# Patient Record
Sex: Female | Born: 1955 | Race: Black or African American | Hispanic: No | Marital: Single | Smoking: Never smoker
Health system: Southern US, Community
[De-identification: ages and names within clinical notes are randomized; demographics above are authoritative.]

## PROBLEM LIST (undated history)

## (undated) DIAGNOSIS — E785 Hyperlipidemia, unspecified: Secondary | ICD-10-CM

## (undated) DIAGNOSIS — E669 Obesity, unspecified: Secondary | ICD-10-CM

## (undated) DIAGNOSIS — F329 Major depressive disorder, single episode, unspecified: Secondary | ICD-10-CM

## (undated) DIAGNOSIS — M6208 Separation of muscle (nontraumatic), other site: Secondary | ICD-10-CM

## (undated) DIAGNOSIS — F32A Depression, unspecified: Secondary | ICD-10-CM

## (undated) DIAGNOSIS — M719 Bursopathy, unspecified: Secondary | ICD-10-CM

## (undated) DIAGNOSIS — M1990 Unspecified osteoarthritis, unspecified site: Secondary | ICD-10-CM

## (undated) DIAGNOSIS — M549 Dorsalgia, unspecified: Secondary | ICD-10-CM

## (undated) DIAGNOSIS — Z9889 Other specified postprocedural states: Secondary | ICD-10-CM

## (undated) DIAGNOSIS — I10 Essential (primary) hypertension: Secondary | ICD-10-CM

## (undated) DIAGNOSIS — K429 Umbilical hernia without obstruction or gangrene: Secondary | ICD-10-CM

## (undated) DIAGNOSIS — F419 Anxiety disorder, unspecified: Secondary | ICD-10-CM

## (undated) DIAGNOSIS — G4730 Sleep apnea, unspecified: Secondary | ICD-10-CM

## (undated) DIAGNOSIS — J302 Other seasonal allergic rhinitis: Secondary | ICD-10-CM

## (undated) DIAGNOSIS — K219 Gastro-esophageal reflux disease without esophagitis: Secondary | ICD-10-CM

## (undated) DIAGNOSIS — K2970 Gastritis, unspecified, without bleeding: Secondary | ICD-10-CM

## (undated) DIAGNOSIS — R079 Chest pain, unspecified: Secondary | ICD-10-CM

## (undated) DIAGNOSIS — G8929 Other chronic pain: Secondary | ICD-10-CM

## (undated) DIAGNOSIS — B9681 Helicobacter pylori [H. pylori] as the cause of diseases classified elsewhere: Secondary | ICD-10-CM

## (undated) DIAGNOSIS — E119 Type 2 diabetes mellitus without complications: Secondary | ICD-10-CM

## (undated) DIAGNOSIS — K5909 Other constipation: Secondary | ICD-10-CM

## (undated) DIAGNOSIS — D649 Anemia, unspecified: Secondary | ICD-10-CM

## (undated) HISTORY — DX: Umbilical hernia without obstruction or gangrene: K42.9

## (undated) HISTORY — PX: DILATION AND CURETTAGE OF UTERUS: SHX78

## (undated) HISTORY — DX: Essential (primary) hypertension: I10

## (undated) HISTORY — DX: Other constipation: K59.09

## (undated) HISTORY — DX: Separation of muscle (nontraumatic), other site: M62.08

## (undated) HISTORY — DX: Helicobacter pylori (H. pylori) as the cause of diseases classified elsewhere: K29.70

## (undated) HISTORY — DX: Obesity, unspecified: E66.9

## (undated) HISTORY — PX: TUBAL LIGATION: SHX77

## (undated) HISTORY — DX: Other specified postprocedural states: Z98.890

## (undated) HISTORY — DX: Hyperlipidemia, unspecified: E78.5

## (undated) HISTORY — DX: Chest pain, unspecified: R07.9

## (undated) HISTORY — DX: Helicobacter pylori (H. pylori) as the cause of diseases classified elsewhere: B96.81

## (undated) HISTORY — DX: Bursopathy, unspecified: M71.9

## (undated) HISTORY — DX: Other seasonal allergic rhinitis: J30.2

---
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
## 1997-09-02 HISTORY — PX: CHOLECYSTECTOMY: SHX55

## 2009-02-03 ENCOUNTER — Ambulatory Visit: Payer: Self-pay | Admitting: Cardiology

## 2009-02-04 ENCOUNTER — Encounter: Payer: Self-pay | Admitting: Cardiology

## 2009-02-23 ENCOUNTER — Ambulatory Visit: Payer: Self-pay | Admitting: Cardiology

## 2009-03-21 DIAGNOSIS — I10 Essential (primary) hypertension: Secondary | ICD-10-CM | POA: Insufficient documentation

## 2009-03-21 DIAGNOSIS — R079 Chest pain, unspecified: Secondary | ICD-10-CM

## 2009-12-21 ENCOUNTER — Ambulatory Visit (HOSPITAL_COMMUNITY): Admission: RE | Admit: 2009-12-21 | Discharge: 2009-12-21 | Payer: Self-pay | Admitting: Family Medicine

## 2009-12-31 DIAGNOSIS — Z9889 Other specified postprocedural states: Secondary | ICD-10-CM

## 2009-12-31 HISTORY — DX: Other specified postprocedural states: Z98.890

## 2009-12-31 HISTORY — PX: ESOPHAGOGASTRODUODENOSCOPY: SHX1529

## 2009-12-31 HISTORY — PX: COLONOSCOPY: SHX174

## 2010-01-17 ENCOUNTER — Ambulatory Visit: Payer: Self-pay | Admitting: Internal Medicine

## 2010-01-17 DIAGNOSIS — R112 Nausea with vomiting, unspecified: Secondary | ICD-10-CM | POA: Insufficient documentation

## 2010-01-17 DIAGNOSIS — K429 Umbilical hernia without obstruction or gangrene: Secondary | ICD-10-CM | POA: Insufficient documentation

## 2010-01-17 DIAGNOSIS — K5900 Constipation, unspecified: Secondary | ICD-10-CM

## 2010-01-17 DIAGNOSIS — K219 Gastro-esophageal reflux disease without esophagitis: Secondary | ICD-10-CM | POA: Insufficient documentation

## 2010-01-17 DIAGNOSIS — R6881 Early satiety: Secondary | ICD-10-CM

## 2010-01-17 DIAGNOSIS — M6200 Separation of muscle (nontraumatic), unspecified site: Secondary | ICD-10-CM

## 2010-01-18 ENCOUNTER — Encounter: Payer: Self-pay | Admitting: Internal Medicine

## 2010-01-19 ENCOUNTER — Encounter (INDEPENDENT_AMBULATORY_CARE_PROVIDER_SITE_OTHER): Payer: Self-pay | Admitting: *Deleted

## 2010-01-19 ENCOUNTER — Ambulatory Visit (HOSPITAL_COMMUNITY): Admission: RE | Admit: 2010-01-19 | Discharge: 2010-01-19 | Payer: Self-pay | Admitting: Internal Medicine

## 2010-01-19 ENCOUNTER — Ambulatory Visit: Payer: Self-pay | Admitting: Internal Medicine

## 2010-01-22 ENCOUNTER — Encounter: Payer: Self-pay | Admitting: Urgent Care

## 2010-02-05 ENCOUNTER — Telehealth: Payer: Self-pay | Admitting: Urgent Care

## 2010-02-07 ENCOUNTER — Telehealth (INDEPENDENT_AMBULATORY_CARE_PROVIDER_SITE_OTHER): Payer: Self-pay | Admitting: *Deleted

## 2010-02-20 ENCOUNTER — Telehealth (INDEPENDENT_AMBULATORY_CARE_PROVIDER_SITE_OTHER): Payer: Self-pay

## 2010-02-21 ENCOUNTER — Encounter: Payer: Self-pay | Admitting: Urgent Care

## 2010-03-02 ENCOUNTER — Ambulatory Visit: Payer: Self-pay | Admitting: Gastroenterology

## 2010-09-18 ENCOUNTER — Encounter (INDEPENDENT_AMBULATORY_CARE_PROVIDER_SITE_OTHER): Payer: Self-pay | Admitting: *Deleted

## 2010-10-02 NOTE — Letter (Signed)
Summary: rx pt asst pro letter  rx pt asst pro letter   Imported By: Rosine Beat 02/21/2010 09:52:35  _____________________________________________________________________  External Attachment:    Type:   Image     Comment:   External Document

## 2010-10-02 NOTE — Progress Notes (Signed)
Summary: Prevpac substitution  ---- Converted from flag ---- ---- 02/01/2010 12:40 PM, Peggyann Shoals wrote: Ms. Cassara called stating she can not afford the new meds Dr. Jena Gauss has put her on, she wants to know if there is something else she can take or can we provide her with samples- She can be reached at [phone] ------------------------------  Appended Document: Prevpac substitution  Discussed w/ pt, cannot afford all meds.  Already has Dexilant and amitiza samples.  Prescriptions: CLARITHROMYCIN 500 MG TABS (CLARITHROMYCIN) 1 by mouth two times a day for h pylori  #28 x 0   Entered and Authorized by:   Joselyn Arrow FNP-BC   Signed by:   Joselyn Arrow FNP-BC on 02/05/2010   Method used:   Electronically to        [facility] (retail)       [address]       [place], [place]  [address]       Ph: [phone]       Fax: [phone]   RxID:   [id number] AMOXICILLIN 500 MG TABS (AMOXICILLIN) 2 by mouth two times a day for h pylori  #56 x 0   Entered and Authorized by:   Joselyn Arrow FNP-BC   Signed by:   Joselyn Arrow FNP-BC on 02/05/2010   Method used:   Electronically to        [facility] (retail)       [address] [place] [address]       [place], [place]  [address]       Ph: [phone]       Fax: [phone]   RxID:   [id number]  Please have pt STOP SIMVASTATIN while taking treatment. Also needs DEXILANT 60mg  two times a day for 14 days w/ antibiotics    Appended Document: Prevpac substitution pt aware

## 2010-10-02 NOTE — Progress Notes (Signed)
Summary: samples  Phone Note Call from Patient Call back at Home Phone [phone]   Caller: Patient Summary of Call: pt called requesting samples- gave 2 boxes of dexilant and 2 boxes of amitiza. pt has been approved for pt assistance program- we recieved approval letter on 02/15/10 by fax. pt should be receiving her pt assistance meds in the mail soon. Initial call taken by: Hendricks Limes LPN,  February 20, 2010 2:13 PM

## 2010-10-02 NOTE — Letter (Signed)
Summary: Scheduled Appointment  [facility]  [address]   [place], [place] [address]   Phone: [phone]  Fax: [phone]    Jan 19, 2010   Dear: Mary Robinson            DOB: 1956/03/10    I have been instructed to schedule you an appointment in our office.  Your appointment is as follows:   Date:   March 02, 2010   Time:   11 AM     Please be here 15 minutes early.   Provider:   Tana Coast, PA    Please contact the office if you need to reschedule this appointment for a more convenient time.   Thank you,    Diana Eves       [facility] Ph: [phone]   Fax: [phone]

## 2010-10-02 NOTE — Medication Information (Signed)
Summary: Tax adviser   Imported By: Diana Eves 01/22/2010 11:54:18  _____________________________________________________________________  External Attachment:    Type:   Image     Comment:   External Document  Appended Document: RX Folder Pt can have dexilant 60mg  two times a day samples x 1 month if we have.  Also, see if there are any self-pay, pt assist plan for this and Amitiza.  Samples Amitza two times a day for constipation x 1 month samples if we have.  Thanks  Appended Document: RX Folder    Prescriptions: DEXILANT 60 MG CPDR (DEXLANSOPRAZOLE) 1 by mouth two times a day for acid reflux  #62 x 5   Entered and Authorized by:   Joselyn Arrow FNP-BC   Signed by:   Joselyn Arrow FNP-BC on 01/22/2010   Method used:   Print then Give to Patient   RxID:   [id number]     Appended Document: RX Folder pt aware, samples at front desk, pt assistance papers given to pt

## 2010-10-02 NOTE — Letter (Signed)
Summary: TCS/EGD ORDER  TCS/EGD ORDER   Imported By: Ave Filter 01/17/2010 17:01:04  _____________________________________________________________________  External Attachment:    Type:   Image     Comment:   External Document

## 2010-10-02 NOTE — Letter (Signed)
Summary: External Other  External Other   Imported By: Peggyann Shoals 01/18/2010 11:16:49  _____________________________________________________________________  External Attachment:    Type:   Image     Comment:   External Document

## 2010-10-02 NOTE — Progress Notes (Signed)
Summary: samples  Phone Note Call from Patient   Reason for Call: Refill Medication Summary of Call: pt is coming by for dexilant and amitiza samples. I have 6 boxes of 5 (Dexilant) and 1 box of Amitiza 8 micrograms up front for her.

## 2010-10-02 NOTE — Assessment & Plan Note (Signed)
Summary: PP FU IN 6 WEEKS/SS   Visit Type:  f/u Primary Care Provider:  [facility]  Chief Complaint:  follow up- doing ok.  History of Present Illness: Patient is here for f/u. She has h/o constipation, n/v, heartburn, early satiety. She underwent recent EGD/TCS. She had multiple tubular adenomas in her colon. She had erosive reflux esophagitis and H.Pylori gastritis. She is feeling much better. Heartburn alot better. Watching diet. Doing real good. Finished prevpac, had lots of vomiting and diarrhea on it. Now back to constipation. Increased Amitiza to two times a day. Now having one to two BMs per day. Some mid-abd pain, worse with movement. Hurts when try to get up out recliner or bed. She has umbilical hernia and diastasis recti. She plans to see surgeon soon to get opinion. She is receiving Amitiza from [facility]. She qualified for Dexilant as well but has not received meds.        Current Medications (verified): 1)  Benazepril Hcl 20 Mg Tabs (Benazepril Hcl) .... Take 1 Tablet By Mouth Once A Day 2)  Amlodipine Besylate 10 Mg Tabs (Amlodipine Besylate) .... Take 1 Tablet By Mouth Once A Day 3)  Catapres 0.2 Mg Tabs (Clonidine Hcl) .... Take 1 Tablet By Mouth Two Times A Day 4)  Hydrochlorothiazide 25 Mg Tabs (Hydrochlorothiazide) .... Take 1 Tablet By Mouth Once A Day 5)  Simvastatin 40 Mg Tabs (Simvastatin) .... Take 1 Tablet By Mouth Once A Day 6)  Aspirin 81 Mg Tbec (Aspirin) .... Take 1 Tablet By Mouth Once A Day 7)  Advair Diskus 100-50 Mcg/dose Aepb (Fluticasone-Salmeterol) .... As Directed Twice Daily 8)  Loratadine 10 Mg Tabs (Loratadine) .... Take 1 Tablet By Mouth Once A Day 9)  Arthritis Pain Regimen Ex St 500 Mg Tbec (Aspirin) .... As Needed 10)  Amitiza 8 Mcg Caps (Lubiprostone) .Marland Kitchen.. 1 By Mouth Two Times A Day For Constipation 11)  Dexilant 60 Mg Cpdr (Dexlansoprazole) .Marland Kitchen.. 1 By Mouth Two Times A Day For Acid Reflux  Allergies (verified): No Known Drug  Allergies  Past History:  Past Medical History: HYPERTENSION, UNSPECIFIED (ICD-401.9) CHEST PAIN-UNSPECIFIED (ICD-786.50) Hyperlipidemia Borderline DM per patient Asthma Allergies Chronic constipation Umbilical hernia Diastasis recti EGD/TCS 01/19/10-->Marked circumferential distal esophageal and linear erosions consistent with rather severe erosive reflux esophagitis; noncritical Schatzki's ring, not manipulated. Diffuse gastric erosion, status post biopsy; patent pylorus; normal D1, D2. Bx showed H. Pylori, s/p treatment. Colonoscopy findings:Diminutive rectal polyp, status post ablation. Long, tortuous, redundant colon.  Multiple colonic polyps in the sigmoid, ascending and cecal segments, multiple snare polypectomies performed. Path-tubular adenomas. Next TCS 12/2012  Review of Systems      See HPI  Vital Signs:  Patient profile:   55 year old female Height:      64 inches Weight:      213 pounds BMI:     36.69 Temp:     97.7 degrees F oral Pulse rate:   80 / minute BP sitting:   122 / 88  (left arm) Cuff size:   regular  Vitals Entered By: Hendricks Limes LPN (March 02, 6605 11:14 AM)  Physical Exam  General:  Well developed, well nourished, no acute distress.obese.   Eyes:  sclera nonicteric Mouth:  op moist Abdomen:  Normal BS. Obese. Mild tenderness left of umbilicus. Small easily reducible hernia to right of umbilicus. Significant diastasis recti. No HSM or masses. No rebound or guarding. Extremities:  No clubbing, cyanosis, edema or deformities noted. Neurologic:  Alert and  oriented x4;  grossly normal neurologically. Skin:  Intact without significant lesions or rashes. Psych:  Alert and cooperative. Normal mood and affect.  Impression & Recommendations:  Problem # 1:  GERD (ICD-530.81)  Significant erosive reflux esophagitis. Doing well on Dexilant. She is back on once daily after having taken two times a day for one month. She will need to continue meds  indefinetly. Will touch base with patient assistance program with Raelene Bott to see where her meds are. #20 samples given today.  F/u OV in 6 months.  Orders: Est. Patient Level II ([address])  Problem # 2:  CONSTIPATION (ICD-564.00)  Better on Amitiza. Continue meds.   Orders: Est. Patient Level II ([address])  Problem # 3:  DIASTASIS RECTI (ICD-728.84)  Diastasis recti and umbilical hernia, likely the source of her abd discomfort. She c/o pain with rising from a reclined position. Denies pp abd pain or pain related to BMs. She plans to f/u with surgeon. If she decides to postpone surgery consultation, would offer her imaging study of abd. She states she will hold off for imaging studies for now, but will let us know if she changes her mind.   Orders: Est. Patient Level II ([address])  Appended Document: PP FU IN 6 WEEKS/SS reminder in computer

## 2010-10-02 NOTE — Assessment & Plan Note (Signed)
Summary: constipation,abd pain/ss   Visit Type:  Initial Consult Referring Provider:  Stanton Kidney Allen/[facility] Primary Care Provider:  Vertis Robinson  Chief Complaint:  constipation.  History of Present Illness: Mary Robinson is a pleasant 55 y/o AA female, patient of Mary Robinson at [facility], who presents for further evaluation of constipation and need for TCS. She has never had prior TCS. C/O constipation of one year. Has been on Colace and Miralax but just gets tired of taking medications. May go several days without BM and then has diarrhea. Denies melena, brbpr. Takes Colace several times per week (three at a time). She also c/o early satiety and pp n/v. She has a lot of heartburn as well. Stopped PPI several months ago trying to reduce her expenses. Takes OTC antacids prn.  Denies dysphagia. C/O mid-abd pain especially with lifting.     Current Medications (verified): 1)  Benazepril Hcl 20 Mg Tabs (Benazepril Hcl) .... Take 1 Tablet By Mouth Once A Day 2)  Amlodipine Besylate 10 Mg Tabs (Amlodipine Besylate) .... Take 1 Tablet By Mouth Once A Day 3)  Catapres 0.2 Mg Tabs (Clonidine Hcl) .... Take 1 Tablet By Mouth Two Times A Day 4)  Hydrochlorothiazide 25 Mg Tabs (Hydrochlorothiazide) .... Take 1 Tablet By Mouth Once A Day 5)  Simvastatin 40 Mg Tabs (Simvastatin) .... Take 1 Tablet By Mouth Once A Day 6)  Aspirin 81 Mg Tbec (Aspirin) .... Take 1 Tablet By Mouth Once A Day 7)  Advair Diskus 100-50 Mcg/dose Aepb (Fluticasone-Salmeterol) .... As Directed Twice Daily 8)  Loratadine 10 Mg Tabs (Loratadine) .... Take 1 Tablet By Mouth Once A Day 9)  Arthritis Pain Regimen Ex St 500 Mg Tbec (Aspirin) .... As Needed 10)  Multivitamins  Caps (Multiple Vitamin) .... Take 1 Tablet By Mouth Once A Day 11)  Stool Softner .... Three Tablets Daily As Needed 12)  Miralax  Powd (Polyethylene Glycol 3350) .... As Needed  Allergies (verified): No Known Drug Allergies  Past History:  Past Medical  History: HYPERTENSION, UNSPECIFIED (ICD-401.9) CHEST PAIN-UNSPECIFIED (ICD-786.50) Hyperlipidemia Borderline DM per patient Asthma Allergies Chronic constipation Umbilical hernia Diastasis recti  Past Surgical History: Cholecystectomy, 1999 D & C tubal ligation  Family History: Family History of Coronary Artery Disease: Father deceased young Family History of Diabetes, CHF, Renal failure Uncle with colon cancer on both sides.  Social History: Unemployed. Let go from [employer] after medical leave for chest pain last year. Widowed. 2 children. Tobacco Use - Never smoked  Alcohol Use - no Drug Use - no  Review of Systems General:  Denies fever, chills, sweats, anorexia, weakness, and weight loss. Eyes:  Denies vision loss. ENT:  Denies nasal congestion, sore throat, hoarseness, and difficulty swallowing. CV:  Complains of dyspnea on exertion; denies chest pains, angina, palpitations, and peripheral edema. Resp:  Complains of dyspnea with exercise; denies dyspnea at rest, cough, sputum, and wheezing. GI:  See HPI. GU:  Denies urinary burning and blood in urine. MS:  Denies joint pain / LOM. Derm:  Denies rash and itching. Neuro:  Denies weakness, frequent headaches, memory loss, and confusion. Psych:  Denies depression and anxiety. Endo:  Denies unusual weight change. Heme:  Denies bruising and bleeding. Allergy:  Denies hives and rash.  Vital Signs:  Patient profile:   55 year old female Height:      64 inches Weight:      219 pounds BMI:     37.73 Temp:     98.2 degrees F oral  Pulse rate:   80 / minute BP sitting:   130 / 90  (left arm) Cuff size:   large  Vitals Entered By: Cloria Spring LPN (Jan 17, 2010 2:04 PM)  Physical Exam  General:  Well developed, well nourished, no acute distress.obese.   Head:  Normocephalic and atraumatic. Eyes:  Conjunctivae pink, no scleral icterus.  Mouth:  Oropharyngeal mucosa moist, pink.  No lesions, erythema or exudate.     Neck:  Supple; no masses or thyromegaly. Lungs:  Clear throughout to auscultation. Heart:  Regular rate and rhythm; no murmurs, rubs,  or bruits. Abdomen:  Normal bowel sounds and obese.  Diastasis recti. Walnut sized umbilical hernia easily reducible. NT. No HSM or masses. No abd bruits.  Rectal:  deferred until time of colonoscopy.   Extremities:  No clubbing, cyanosis, edema or deformities noted. Neurologic:  Alert and  oriented x4;  grossly normal neurologically. Skin:  Intact without significant lesions or rashes. Cervical Nodes:  No significant cervical adenopathy. Psych:  Alert and cooperative. Normal mood and affect.  Impression & Recommendations:  Problem # 1:  CONSTIPATION (ICD-564.00)  Chronic constipation in setting of polypharmacy and decreased activity. No prior TCS. Colonoscopy to be performed in near future.  Risks, alternatives, and benefits including but not limited to the risk of reaction to medication, bleeding, infection, and perforation were addressed.  Patient voiced understanding and provided verbal consent. Continue Colace 200mg  by mouth daily. May add Miralax 17 grams daily as needed.   Orders: Consultation Level IV ([address])  Problem # 2:  NAUSEA AND VOMITING (ICD-787.01) PP early satiety and n/v over past one year. Gives h/o borderline DM. ?gastroparesis, PUD, refractory GERD. Recommend EGD for further evaluation. EGD to be performed in near future.  Risks, alternatives, benefits including but not limited to risk of reaction to medications, bleeding, infection, and perforation addressed.  Patient voiced understanding and verbal consent obtained. Patient wants to hold on PPI until after her EGD.   Problem # 3:  UMBILICAL HERNIA (ICD-553.1)  Small umbilical hernia. Patient c/o associated pain. She plans to make appt with surgeon after her TCS/EGD. She also has diastasis recti which can be evaluated by surgeon as well.  Orders: Consultation Level IV ([address]) I  would like to thank Mary Kelch, NP for allowing [place] to take part in the care of this nice patient.

## 2010-10-02 NOTE — Medication Information (Signed)
Summary: Tax adviser   Imported By: Diana Eves 01/22/2010 11:53:23  _____________________________________________________________________  External Attachment:    Type:   Image     Comment:   External Document  Appended Document: RX Folder see RF same date  Appended Document: RX Folder    Prescriptions: AMITIZA 8 MCG CAPS (LUBIPROSTONE) 1 by mouth two times a day for constipation  #60 x 5   Entered and Authorized by:   Joselyn Arrow FNP-BC   Signed by:   Joselyn Arrow FNP-BC on 01/22/2010   Method used:   Print then Give to Patient   RxID:   [id number]

## 2010-10-04 NOTE — Letter (Signed)
Summary: Recall Office Visit  [facility]  [address]   [place], [place] [address]   Phone: [phone]  Fax: [phone]      September 18, 2010   Mary Robinson [address] [place], [place]  [address] 10-31-1955   Dear [name],   According to our records, it is time for you to schedule a follow-up office visit with [place].   At your convenience, please call [phone] to schedule an office visit. If you have any questions, concerns, or feel that this letter is in error, we would appreciate your call.   Sincerely,    Diana Eves  [facility] Ph: [phone]   Fax: [phone]

## 2011-01-14 ENCOUNTER — Other Ambulatory Visit (HOSPITAL_COMMUNITY): Payer: Self-pay | Admitting: Family Medicine

## 2011-01-14 DIAGNOSIS — Z139 Encounter for screening, unspecified: Secondary | ICD-10-CM

## 2011-01-15 ENCOUNTER — Ambulatory Visit: Payer: Self-pay | Admitting: Gastroenterology

## 2011-01-15 NOTE — Assessment & Plan Note (Signed)
[facility]                          [facility] OFFICE NOTE   NAME:NEALBrandyce, Dimario                       MRN:          [id number]  DATE:02/23/2009                            DOB:          [birth year]    REFERRING PHYSICIAN:  Donzetta Sprung   HISTORY OF PRESENT ILLNESS:  The patient is a pleasant 55 year old  African American female who was recently hospitalized with atypical  chest pain and dyspnea.  The patient ruled out from myocardial  infarction.  The patient was under significant amount of stress.  There  was a complaint of anxiety.  Ultimately, Dr. Reuel Boom also felt that this  patient mostly had heartburn versus gastroesophageal reflux disease.  Her blood pressure was uncontrolled at that time.  She states now she  has gathered her life. and she is under less stress as she has been on  a leave of absence.  She feels very good without any substernal chest  pain.  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   MEDICATIONS:  1. Clonidine 0.2 mg p.o. b.i.d.  2. Amlodipine 10 mg p.o. daily.  3. Hydrochlorothiazide 25 mg p.o. nightly.  4. Omeprazole 20 mg p.o. daily.  5. Simvastatin 40 mg p.o. nightly.  6. Aspirin 81 mg p.o. daily.  7. Loratadine 10 mg p.o. daily.  8. Advair 100/50 one puff b.i.d.   PHYSICAL EXAMINATION:  VITAL SIGNS:  Blood pressure 130/89, heart rate  70, weight 205 pounds.  GENERAL:  A well-nourished African American female in no apparent  distress.  HEENT:  Pupils are isocoric.  Conjunctivae are clear.  NECK:  Supple.  Normal carotid upstroke.  No carotid bruits.  LUNGS:  Clear breath sounds bilaterally.  HEART:  Regular rate and rhythm.  Normal S1 and S2.  No murmur, rubs, or  gallops.  ABDOMEN:  Soft, nontender.  No rebound or guarding.  Good bowel sounds.  EXTREMITIES:  No cyanosis, clubbing or edema.  NEURO:  The patient is alert,  oriented, and grossly nonfocal.   PROBLEM LIST:  1. Atypical chest pain, likely gastroesophageal reflux disease.  2. Hypertension controlled.  3. Family history of coronary artery disease.   PLAN:  1. In my previous discussion, I recommended a stress test prior to the      office visit, but given the fact that the patient is doing quite      well and has made some lifestyle changes, I do not think immediate      need for stress testing.  2. The patient can follow up with Dr. Reuel Boom and we will give her a 6-      month appointment for further evaluation as needed.  3. The patient's blood pressure is also under much better control.      The patient states that she is very compliant with her medical      regimen.     Learta Codding, MD,FACC  Electronically Signed    GED/MedQ  DD: 02/23/2009  DT: 02/24/2009  Job #:  [id number]   cc:   Donzetta Sprung

## 2011-01-30 ENCOUNTER — Ambulatory Visit: Payer: Self-pay | Admitting: Gastroenterology

## 2011-01-31 ENCOUNTER — Encounter: Payer: Self-pay | Admitting: Gastroenterology

## 2011-01-31 ENCOUNTER — Ambulatory Visit (INDEPENDENT_AMBULATORY_CARE_PROVIDER_SITE_OTHER): Payer: Medicaid Other | Admitting: Gastroenterology

## 2011-01-31 VITALS — BP 129/82 | HR 68 | Temp 97.2°F | Ht 64.0 in | Wt 188.4 lb

## 2011-01-31 DIAGNOSIS — R109 Unspecified abdominal pain: Secondary | ICD-10-CM

## 2011-01-31 DIAGNOSIS — K5900 Constipation, unspecified: Secondary | ICD-10-CM

## 2011-01-31 DIAGNOSIS — R112 Nausea with vomiting, unspecified: Secondary | ICD-10-CM

## 2011-01-31 MED ORDER — DEXLANSOPRAZOLE 60 MG PO CPDR: 60.0000 mg | DELAYED_RELEASE_CAPSULE | Freq: Every day | ORAL | Status: DC

## 2011-01-31 MED ORDER — POLYETHYLENE GLYCOL 3350 17 GM/SCOOP PO POWD: 17.0000 g | ORAL | Status: DC

## 2011-01-31 NOTE — Progress Notes (Signed)
Referring Provider: Isac Sarna, MD Primary Care Physician:  Isac Sarna, MD Primary Gastroenterologist: Dr. Jena Gauss    Chief Complaint  Patient presents with  . Diarrhea  . Emesis    HPI:   Mary Robinson is a pleasant 55 year old female who presents in f/u for hx of constipation, N/V, GERD, and early satiety. She was last seen in July 2011. Last EGD/colonoscopy was in May 2011; this showed severe erosive reflux esophagitis, +H.pylori gastritis, and multiple tubular adenomas. She was on Amitiza for constipation, but she has stopped this now that she is having episodes of diarrhea/abdominal pain/diarrhea. She is quite anxious today. She states each night she feels like she will have a heart attack. She denies any shortness of breath. Reports exhaustion, as she is having difficulty sleeping, and her nerves are "keyed up".  She reports spans of normal days with normal bowel movements, then will have episodes of diarrhea/vomiting/abdominal pain. She had stopped Dexilant at one point, but noticed this worsened symptoms. She is now only taking Dexilant every other day.  She also reports RLQ sharp pain, intermittent, lasting a few hours at a time. This is not relieved or exacerbated by BMs. Just a nagging pain. Her biggest concern is the episodes of diarrhea, N/V. She lives by herself now, and she is quite worried about obtaining a PCP. As of note, she does not currently have a PCP. In fact, she is having difficulty obtaining one due to change in insurance. Has medicaid now.    Past Medical History  Diagnosis Date  . HTN (hypertension)   . Chest pain, unspecified   . Hyperlipidemia   . DM (diabetes mellitus)     Borderline  . Asthma   . Seasonal allergies   . Chronic constipation   . Umbilical hernia   . Diastasis recti   . S/P endoscopy May 2011    severe erosive reflux esophagitis, noncritial schatzki's ring, +h.pylori  . S/P colonoscopy May 2011    multiple tubular adenomas: next TCS  May 2014  . Helicobacter pylori gastritis     s/p treatment may 2011    Past Surgical History  Procedure Date  . Cholecystectomy 1999  . Dilation and curettage of uterus   . Tubal ligation     Current Outpatient Prescriptions  Medication Sig Dispense Refill  . amLODipine (NORVASC) 10 MG tablet Take 10 mg by mouth daily.        Marland Kitchen aspirin 81 MG tablet Take 81 mg by mouth daily.        Marland Kitchen atenolol-chlorthalidone (TENORETIC) 50-25 MG per tablet Take 1 tablet by mouth daily.        . cloNIDine (CATAPRES) 0.2 MG tablet Take 0.2 mg by mouth 2 (two) times daily.        Marland Kitchen dexlansoprazole (DEXILANT) 60 MG capsule Take 1 capsule (60 mg total) by mouth daily.  31 capsule  3  . Fluticasone-Salmeterol (ADVAIR DISKUS) 100-50 MCG/DOSE AEPB Inhale 1 puff into the lungs every 12 (twelve) hours.        Marland Kitchen loratadine (CLARITIN) 10 MG tablet Take 10 mg by mouth daily.        . metFORMIN (GLUCOPHAGE) 850 MG tablet Take 850 mg by mouth 2 (two) times daily with a meal.        . simvastatin (ZOCOR) 40 MG tablet Take 40 mg by mouth at bedtime.        . benazepril (LOTENSIN) 20 MG tablet Take 20 mg by mouth daily.        Marland Kitchen  hydrochlorothiazide 25 MG tablet Take 25 mg by mouth daily.       Marland Kitchen lubiprostone (AMITIZA) 8 MCG capsule Take 8 mcg by mouth 2 (two) times daily with a meal.        . polyethylene glycol powder (MIRALAX) powder Take 17 g by mouth every other day.  255 g  3    Allergies as of 01/31/2011  . (No Known Allergies)    Family History  Problem Relation Age of Onset  . Colon cancer Maternal Uncle   . Colon cancer Paternal Uncle     History   Social History  . Marital Status: Single    Spouse Name: N/A    Number of Children: N/A  . Years of Education: N/A   Social History Main Topics  . Smoking status: Never Smoker   . Smokeless tobacco: None  . Alcohol Use: No  . Drug Use: No  . Sexually Active: None      Review of Systems: Gen: Denies fever, chills, anorexia. Denies fatigue,  weakness, weight loss.  CV: Denies chest pain, palpitations, syncope, peripheral edema, and claudication. Resp: Denies dyspnea at rest, cough, wheezing, coughing up blood, and pleurisy. GI: Denies vomiting blood, jaundice, and fecal incontinence.   Denies dysphagia or odynophagia. Derm: Denies rash, itching, dry skin Psych: Denies depression, anxiety, memory loss, confusion. No homicidal or suicidal ideation.  Heme: Denies bruising, bleeding, and enlarged lymph nodes.  Physical Exam: BP 129/82  Pulse 68  Temp(Src) 97.2 F (36.2 C) (Temporal)  Ht 5\' 4"  (1.626 m)  Wt 188 lb 6.4 oz (85.458 kg)  BMI 32.34 kg/m2 General:   Alert and oriented. No distress noted. Pleasant, yet somewhat anxious regarding current health status.  Head:  Normocephalic and atraumatic. Eyes:  Conjuctiva clear without scleral icterus. Mouth:  Oral mucosa pink and moist. Good dentition. No lesions. Neck:  Supple, without mass or thyromegaly. Heart:  S1, S2 present without murmurs, rubs, or gallops. Regular rate and rhythm. Abdomen:  +BS, soft, non-tender and non-distended. No rebound or guarding. No HSM or masses noted. Umbilical hernia noted, easily reducible. Diastasis recti.  Msk:  Symmetrical without gross deformities. Normal posture. Extremities:  Without edema. Neurologic:  Alert and  oriented x4;  grossly normal neurologically. Skin:  Intact without significant lesions or rashes. Cervical Nodes:  No significant cervical adenopathy. Psych:  Alert and cooperative. Normal mood and affect.

## 2011-01-31 NOTE — Patient Instructions (Signed)
Start taking Dexilant every day, 30 minutes before breakfast.   Follow a high fiber diet. Please see handout.  Stop Amitiza for now. Begin Miralax, 1 capful every other day. You may take it every day if needed. Let's shoot for a goal of a bowel movement every other day. This should help keep you from getting too constipation and "backed up".   Start taking a probiotic. You have been given samples.   Let's get your bowel habits back on track; if you are still having lower belly discomfort, we will proceed with an xray of your belly.  Please call our office in about 1-2 weeks with an update. We will see you back in 4 weeks.

## 2011-02-03 ENCOUNTER — Encounter: Payer: Self-pay | Admitting: Gastroenterology

## 2011-02-03 DIAGNOSIS — R109 Unspecified abdominal pain: Secondary | ICD-10-CM | POA: Insufficient documentation

## 2011-02-03 NOTE — Assessment & Plan Note (Signed)
Complaints of RLQ pain, intermittent, lasting a few hours at a time. Not associated with BMs. Does have hx of chronic constipation, no longer taking Amitiza. Has episodes of constipation alternating with diarrhea; question component of IBS. Will implement high fiber diet, hold on Amitiza for now, institute miralax every other day and daily if needed. Will pursue CT scan if no improvement in 1-2 weeks.

## 2011-02-03 NOTE — Assessment & Plan Note (Signed)
55 year old female with hx of H.pylori gastritis, as well as chronic N/V. Symptoms intermittent; pt not taking daily PPI, only every other day. Likely symptoms may be r/t uncontrolled GERD. Possible gastroparesis remains in differential due to hx of diabetes and lack of primary care for appropriate control of diabetes. No dysphagia/odynophagia. Pt quite anxious as well regarding lack of primary care, although I think this is only exacerbating situation and not the cause. Will trial Dexilant daily, consider GES if symptoms progress.

## 2011-02-03 NOTE — Assessment & Plan Note (Addendum)
See note under abdominal pain. As of note, pt has been set up with PCP for mid June.

## 2011-02-04 NOTE — Progress Notes (Signed)
Cc to PCP 

## 2011-02-12 ENCOUNTER — Ambulatory Visit (HOSPITAL_COMMUNITY)
Admission: RE | Admit: 2011-02-12 | Discharge: 2011-02-12 | Disposition: A | Discharge status: Home / Self Care | Payer: Medicaid Other | Source: Ambulatory Visit | Attending: Family Medicine | Admitting: Family Medicine

## 2011-02-12 DIAGNOSIS — Z139 Encounter for screening, unspecified: Secondary | ICD-10-CM

## 2011-02-12 DIAGNOSIS — Z1231 Encounter for screening mammogram for malignant neoplasm of breast: Secondary | ICD-10-CM | POA: Insufficient documentation

## 2011-02-28 ENCOUNTER — Ambulatory Visit: Payer: Medicaid Other | Admitting: Gastroenterology

## 2011-03-04 ENCOUNTER — Ambulatory Visit (INDEPENDENT_AMBULATORY_CARE_PROVIDER_SITE_OTHER): Payer: Medicaid Other | Admitting: Gastroenterology

## 2011-03-04 ENCOUNTER — Encounter: Payer: Self-pay | Admitting: Gastroenterology

## 2011-03-04 DIAGNOSIS — K5900 Constipation, unspecified: Secondary | ICD-10-CM

## 2011-03-04 DIAGNOSIS — R109 Unspecified abdominal pain: Secondary | ICD-10-CM

## 2011-03-04 DIAGNOSIS — K219 Gastro-esophageal reflux disease without esophagitis: Secondary | ICD-10-CM

## 2011-03-04 MED ORDER — ESOMEPRAZOLE MAGNESIUM 40 MG PO CPDR: 40.0000 mg | DELAYED_RELEASE_CAPSULE | Freq: Every day | ORAL | Status: DC

## 2011-03-04 NOTE — Assessment & Plan Note (Signed)
Doing very well on Nexium. New prescription provided. Typical heartburn now controlled. No longer having vomiting episodes. Some intermittent nausea as well as early satiety which may be due to gastroparesis from her diabetes. Patient has been trying to eat multiple small meals daily. This has provided her with some benefit. She is not interested in gastric emptying study at this time. If her symptoms worsen she'll call and will schedule accordingly.

## 2011-03-04 NOTE — Progress Notes (Signed)
Primary Care Physician: Avon Gully, MD  Primary Gastroenterologist:  Roetta Sessions, MD  Chief Complaint  Patient presents with  . Follow-up    HPI: Mary Robinson is a 55 y.o. female here for followup. Heartburn better on Nexium. No vomiting, some nausea. BM every 2 days or so. Taking MiraLax every other day. No melena, brbpr.  RLQ pain better. Heat makes sick. Still some early satiety. Has an appointment to see Dr. Felecia Shelling on July 9 to establish care.    Current Outpatient Prescriptions  Medication Sig Dispense Refill  . amLODipine (NORVASC) 10 MG tablet Take 10 mg by mouth daily.        Marland Kitchen aspirin 81 MG tablet Take 81 mg by mouth daily.        Marland Kitchen atenolol-chlorthalidone (TENORETIC) 50-25 MG per tablet Take 1 tablet by mouth daily.        . cloNIDine (CATAPRES) 0.2 MG tablet Take 0.1 mg by mouth daily.       Marland Kitchen esomeprazole (NEXIUM) 40 MG capsule Take 1 capsule (40 mg total) by mouth daily before breakfast.  30 capsule  11  . Fluticasone-Salmeterol (ADVAIR DISKUS) 100-50 MCG/DOSE AEPB Inhale 1 puff into the lungs every 12 (twelve) hours.        Marland Kitchen loratadine (CLARITIN) 10 MG tablet Take 10 mg by mouth daily.        . metFORMIN (GLUCOPHAGE) 850 MG tablet Take 850 mg by mouth 2 (two) times daily with a meal.        . polyethylene glycol powder (MIRALAX) powder Take 17 g by mouth every other day.  255 g  3  . simvastatin (ZOCOR) 40 MG tablet Take 40 mg by mouth at bedtime.        .       .      .      .      .        Allergies as of 03/04/2011  . (No Known Allergies)    ROS:  General: Negative for anorexia, weight loss, fever, chills, fatigue, weakness. ENT: Negative for hoarseness, difficulty swallowing , nasal congestion. CV: Negative for chest pain, angina, palpitations, dyspnea on exertion, peripheral edema.  Respiratory: Negative for dyspnea at rest, dyspnea on exertion, cough, sputum, wheezing.  GI: See history of present illness. GU:  Negative for dysuria, hematuria,  urinary incontinence, urinary frequency, nocturnal urination.  Endo: Negative for unusual weight change.    Physical Examination:   BP 124/85  Pulse 66  Temp(Src) 98.1 F (36.7 C) (Temporal)  Ht 5\' 4"  (1.626 m)  Wt 189 lb 12.8 oz (86.093 kg)  BMI 32.58 kg/m2  General: Well-nourished, well-developed in no acute distress.  Eyes: No icterus. Mouth: Oropharyngeal mucosa moist and pink , no lesions erythema or exudate. Lungs: Clear to auscultation bilaterally.  Heart: Regular rate and rhythm, no murmurs rubs or gallops.  Abdomen: Bowel sounds are normal, nontender, nondistended, no hepatosplenomegaly or masses, no abdominal bruits or hernia , no rebound or guarding.   Extremities: No lower extremity edema.  Neuro: Alert and oriented x 4   Skin: Warm and dry, no jaundice.   Psych: Alert and cooperative, normal mood and affect.

## 2011-03-04 NOTE — Progress Notes (Signed)
Cc to PCP 

## 2011-03-04 NOTE — Assessment & Plan Note (Signed)
Continue MiraLax every other day to every day as needed for constipation. Continue high fiber diet.

## 2011-03-04 NOTE — Progress Notes (Signed)
AGREE

## 2011-03-04 NOTE — Assessment & Plan Note (Signed)
Abdominal pain resolved with treatment of constipation.

## 2011-03-07 ENCOUNTER — Telehealth: Payer: Self-pay

## 2011-03-07 DIAGNOSIS — K219 Gastro-esophageal reflux disease without esophagitis: Secondary | ICD-10-CM

## 2011-03-07 NOTE — Telephone Encounter (Signed)
I called [facility] and lmom for them to call back for scheduling.

## 2011-03-07 NOTE — Telephone Encounter (Signed)
Pt is scheduled for ges 03/12/11@8 :00am. Pt aware of appt. and prep instructions.

## 2011-03-07 NOTE — Telephone Encounter (Signed)
Pt called- she went to ED Tuesday night with vomiting. Pt stated they gave her zofran and it seems to be working well but she wants to be scheduled for the GES now. Per [facility]SL ok to schedule.

## 2011-03-12 ENCOUNTER — Encounter (HOSPITAL_COMMUNITY)
Admission: RE | Admit: 2011-03-12 | Discharge: 2011-03-12 | Disposition: A | Discharge status: Home / Self Care | Payer: Medicaid Other | Source: Ambulatory Visit | Attending: Gastroenterology | Admitting: Gastroenterology

## 2011-03-12 ENCOUNTER — Encounter (HOSPITAL_COMMUNITY): Payer: Self-pay

## 2011-03-12 DIAGNOSIS — K219 Gastro-esophageal reflux disease without esophagitis: Secondary | ICD-10-CM

## 2011-03-12 MED ORDER — TECHNETIUM TC 99M SULFUR COLLOID
2.0000 | Freq: Once | INTRAVENOUS | Status: AC | PRN
  Administered 2011-03-12: 1.9 via ORAL

## 2012-02-04 ENCOUNTER — Other Ambulatory Visit: Payer: Self-pay

## 2012-02-04 MED ORDER — POLYETHYLENE GLYCOL 3350 17 GM/SCOOP PO POWD: 17.0000 g | Freq: Every day | ORAL | Status: DC | PRN

## 2012-02-19 ENCOUNTER — Other Ambulatory Visit (HOSPITAL_COMMUNITY): Payer: Self-pay | Admitting: Internal Medicine

## 2012-02-19 DIAGNOSIS — Z139 Encounter for screening, unspecified: Secondary | ICD-10-CM

## 2012-03-09 ENCOUNTER — Ambulatory Visit (HOSPITAL_COMMUNITY)
Admission: RE | Admit: 2012-03-09 | Discharge: 2012-03-09 | Disposition: A | Discharge status: Home / Self Care | Payer: Medicare Other | Source: Ambulatory Visit | Attending: Internal Medicine | Admitting: Internal Medicine

## 2012-03-09 DIAGNOSIS — Z1231 Encounter for screening mammogram for malignant neoplasm of breast: Secondary | ICD-10-CM | POA: Insufficient documentation

## 2012-03-09 DIAGNOSIS — Z139 Encounter for screening, unspecified: Secondary | ICD-10-CM

## 2013-02-16 ENCOUNTER — Encounter: Payer: Self-pay | Admitting: Internal Medicine

## 2013-04-05 ENCOUNTER — Encounter: Payer: Self-pay | Admitting: Gastroenterology

## 2013-04-05 ENCOUNTER — Ambulatory Visit (INDEPENDENT_AMBULATORY_CARE_PROVIDER_SITE_OTHER): Payer: Medicare Other | Admitting: Gastroenterology

## 2013-04-05 VITALS — BP 136/87 | HR 75 | Temp 98.8°F | Ht 64.0 in | Wt 215.8 lb

## 2013-04-05 DIAGNOSIS — Z860100 Personal history of colon polyps, unspecified: Secondary | ICD-10-CM

## 2013-04-05 DIAGNOSIS — K469 Unspecified abdominal hernia without obstruction or gangrene: Secondary | ICD-10-CM

## 2013-04-05 LAB — BASIC METABOLIC PANEL
BUN: 13 mg/dL (ref 6–23)
Sodium: 139 mEq/L (ref 135–145)

## 2013-04-05 MED ORDER — PEG 3350-KCL-NA BICARB-NACL 420 G PO SOLR: 4000.0000 mL | ORAL | Status: DC

## 2013-04-05 NOTE — Progress Notes (Signed)
 Referring Provider: Fanta, Tesfaye, MD Primary Care Physician:  FANTA,TESFAYE, MD Primary GI: Dr. Rourk   Chief Complaint  Patient presents with  . Colonoscopy  . Weight Gain    has area of concern in her abd.    HPI:   57-year-old female presents today to schedule surveillance colonoscopy. History of multiple tubular adenomas on colonoscopy May 2011, due for surveillance now.   History of constipation, takes Miralax, then diarrhea. Trying to eat right, drink plenty of water. Once a week has the diarrhea. Doesn't really want to try anything for constipation currently. Worried about abdominal mass/hernia. Occasional discomfort at hernia site. Stopped probiotics around Christmas last year. States hernia is a nuisance. Anxiety regarding it. Feels like clothes aren't fitting. No rectal bleeding. Mild nausea if hungry. Improved after eating.   Past Medical History  Diagnosis Date  . HTN (hypertension)   . Chest pain, unspecified   . Hyperlipidemia   . DM (diabetes mellitus)     Borderline  . Asthma   . Seasonal allergies   . Chronic constipation   . Umbilical hernia   . Diastasis recti   . S/P endoscopy May 2011    severe erosive reflux esophagitis, noncritial schatzki's ring, +h.pylori  . S/P colonoscopy May 2011    multiple tubular adenomas: next TCS May 2014  . Helicobacter pylori gastritis     s/p treatment may 2011    Past Surgical History  Procedure Laterality Date  . Cholecystectomy  1999  . Dilation and curettage of uterus    . Tubal ligation    . Colonoscopy  May 2011    Dr. Rourk: long, torturous colon, multiple colonic polyps. TUBULAR ADENOMAS  . Esophagogastroduodenoscopy  May 2011    Dr. Rourk: severe erosive reflux esophagitis, non-critical Schatzki's ring, diffuse gastric erosions. H. PYLORI GASTRITIS    Current Outpatient Prescriptions  Medication Sig Dispense Refill  . acetaminophen (TYLENOL) 650 MG CR tablet Take 650 mg by mouth every 8 (eight) hours  as needed for pain.      . amLODipine (NORVASC) 10 MG tablet Take 10 mg by mouth daily.        . aspirin 81 MG tablet Take 81 mg by mouth daily.        . atenolol-chlorthalidone (TENORETIC) 50-25 MG per tablet Take 1 tablet by mouth daily.        . Fluticasone-Salmeterol (ADVAIR DISKUS) 100-50 MCG/DOSE AEPB Inhale 1 puff into the lungs every 12 (twelve) hours.        . loratadine (CLARITIN) 10 MG tablet Take 10 mg by mouth daily.        . metFORMIN (GLUCOPHAGE) 850 MG tablet Take 850 mg by mouth 2 (two) times daily with a meal.        . omeprazole (PRILOSEC) 20 MG capsule Take 20 mg by mouth daily.      . ONGLYZA 5 MG TABS tablet Take 5 mg by mouth daily.      . polyethylene glycol powder (MIRALAX) powder Take 17 g by mouth daily as needed.  527 g  11  . simvastatin (ZOCOR) 40 MG tablet Take 40 mg by mouth at bedtime.        . polyethylene glycol-electrolytes (TRILYTE) 420 G solution Take 4,000 mLs by mouth as directed.  4000 mL  0   No current facility-administered medications for this visit.    Allergies as of 04/05/2013  . (No Known Allergies)    Family History  Problem Relation Age   of Onset  . Colon cancer Maternal Uncle   . Colon cancer Paternal Uncle     History   Social History  . Marital Status: Divorced    Spouse Name: N/A    Number of Children: N/A  . Years of Education: N/A   Social History Main Topics  . Smoking status: Never Smoker   . Smokeless tobacco: None  . Alcohol Use: No  . Drug Use: No  . Sexually Active: None   Other Topics Concern  . None   Social History Narrative  . None    Review of Systems: Negative unless mentioned in HPI.   Physical Exam: BP 136/87  Pulse 75  Temp(Src) 98.8 F (37.1 C) (Oral)  Ht 5' 4" (1.626 m)  Wt 215 lb 12.8 oz (97.886 kg)  BMI 37.02 kg/m2 General:   Alert and oriented. No distress noted. Pleasant and cooperative.  Head:  Normocephalic and atraumatic. Eyes:  Conjuctiva clear without scleral icterus. Mouth:   Oral mucosa pink and moist. Good dentition. No lesions. Neck:  Supple, without mass or thyromegaly. Heart:  S1, S2 present without murmurs, rubs, or gallops. Regular rate and rhythm. Abdomen:  +BS, soft, non-tender and non-distended. Definite umbilical hernia noted, possible ventral hernia/rectus diastasis noted with valsalva. Mild tenderness. Large AP diameter.  Msk:  Symmetrical without gross deformities. Normal posture. Extremities:  Without edema. Neurologic:  Alert and  oriented x4;  grossly normal neurologically. Skin:  Intact without significant lesions or rashes. Psych:  Alert and cooperative. Normal mood and affect.  

## 2013-04-05 NOTE — Patient Instructions (Addendum)
We have set you up for a CT scan of your belly in the near future.   Please have blood work done.   We have also scheduled you for a colonoscopy with Dr. Jena Gauss in the near future.

## 2013-04-06 ENCOUNTER — Encounter (HOSPITAL_COMMUNITY): Payer: Self-pay | Admitting: Pharmacy Technician

## 2013-04-06 DIAGNOSIS — K469 Unspecified abdominal hernia without obstruction or gangrene: Secondary | ICD-10-CM | POA: Insufficient documentation

## 2013-04-06 NOTE — Assessment & Plan Note (Signed)
Umbilical hernia with possible ventral hernia/diastasis rectus. Bothersome to patient, with no CT on file. Proceed with CT of abdomen in near future. Doubt Mary Robinson would be a surgical candidate due to obesity; discussed weight loss efforts as first-line in decreasing abdominal obesity.

## 2013-04-06 NOTE — Assessment & Plan Note (Signed)
57 year old female presents with history of multiple tubular adenomas on last colonoscopy in May 2011, now due for surveillance colonoscopy. She has no rectal bleeding or change in bowel habits. Chronic constipation managed less than ideally with Miralax; she declined trial of prescription medication to include Linzess or Amitiza. Encouraged high fiber diet and behavior modification.   Proceed with TCS with Dr. Jena Gauss in near future: the risks, benefits, and alternatives have been discussed with the patient in detail. The patient states understanding and desires to proceed.

## 2013-04-07 ENCOUNTER — Ambulatory Visit (HOSPITAL_COMMUNITY)
Admission: RE | Admit: 2013-04-07 | Discharge: 2013-04-07 | Disposition: A | Discharge status: Home / Self Care | Payer: Medicare Other | Source: Ambulatory Visit | Attending: Gastroenterology | Admitting: Gastroenterology

## 2013-04-07 DIAGNOSIS — K439 Ventral hernia without obstruction or gangrene: Secondary | ICD-10-CM | POA: Insufficient documentation

## 2013-04-07 DIAGNOSIS — K469 Unspecified abdominal hernia without obstruction or gangrene: Secondary | ICD-10-CM

## 2013-04-07 DIAGNOSIS — K7689 Other specified diseases of liver: Secondary | ICD-10-CM | POA: Insufficient documentation

## 2013-04-07 MED ORDER — IOHEXOL 300 MG/ML  SOLN: 50.0000 mL | Freq: Once | INTRAMUSCULAR | Status: AC | PRN

## 2013-04-07 MED ORDER — IOHEXOL 300 MG/ML  SOLN
100.0000 mL | Freq: Once | INTRAMUSCULAR | Status: AC | PRN
  Administered 2013-04-07: 100 mL via INTRAVENOUS

## 2013-04-08 NOTE — Progress Notes (Signed)
Quick Note:  CT reviewed: Fatty liver.  Known supraumbilical hernia is slightly larger.  Another hernia in the umbilical region contains fat, slightly larger A third ventral hernia noted containing fat.  Let's get a set of LFTs due to fatty liver. Recommend diet/exercise to decrease abdominal obesity. Doubt surgical candidate right now until further weight loss. However, after colonoscopy, we can refer to general surgery just for consultation or further recommendations. ______

## 2013-04-08 NOTE — Progress Notes (Signed)
Cc PCP 

## 2013-04-15 ENCOUNTER — Ambulatory Visit (HOSPITAL_COMMUNITY)
Admission: RE | Admit: 2013-04-15 | Discharge: 2013-04-15 | Disposition: A | Discharge status: Home / Self Care | Payer: Medicare Other | Source: Ambulatory Visit | Attending: Internal Medicine | Admitting: Internal Medicine

## 2013-04-15 ENCOUNTER — Other Ambulatory Visit: Payer: Self-pay | Admitting: Internal Medicine

## 2013-04-15 ENCOUNTER — Encounter (HOSPITAL_COMMUNITY)
Admission: RE | Disposition: A | Discharge status: Home / Self Care | Payer: Self-pay | Source: Ambulatory Visit | Attending: Internal Medicine

## 2013-04-15 ENCOUNTER — Encounter (HOSPITAL_COMMUNITY): Payer: Self-pay | Admitting: *Deleted

## 2013-04-15 DIAGNOSIS — I10 Essential (primary) hypertension: Secondary | ICD-10-CM | POA: Insufficient documentation

## 2013-04-15 DIAGNOSIS — D126 Benign neoplasm of colon, unspecified: Secondary | ICD-10-CM | POA: Insufficient documentation

## 2013-04-15 DIAGNOSIS — Z01812 Encounter for preprocedural laboratory examination: Secondary | ICD-10-CM | POA: Insufficient documentation

## 2013-04-15 DIAGNOSIS — E119 Type 2 diabetes mellitus without complications: Secondary | ICD-10-CM | POA: Insufficient documentation

## 2013-04-15 DIAGNOSIS — Z860100 Personal history of colon polyps, unspecified: Secondary | ICD-10-CM | POA: Insufficient documentation

## 2013-04-15 DIAGNOSIS — K469 Unspecified abdominal hernia without obstruction or gangrene: Secondary | ICD-10-CM

## 2013-04-15 DIAGNOSIS — K429 Umbilical hernia without obstruction or gangrene: Secondary | ICD-10-CM

## 2013-04-15 DIAGNOSIS — Z1211 Encounter for screening for malignant neoplasm of colon: Secondary | ICD-10-CM

## 2013-04-15 HISTORY — PX: COLONOSCOPY: SHX5424

## 2013-04-15 SURGERY — COLONOSCOPY
Anesthesia: Moderate Sedation

## 2013-04-15 MED ORDER — MIDAZOLAM HCL 5 MG/5ML IJ SOLN
INTRAMUSCULAR | Status: AC
  Filled 2013-04-15: qty 10

## 2013-04-15 MED ORDER — MEPERIDINE HCL 100 MG/ML IJ SOLN
INTRAMUSCULAR | Status: DC | PRN
  Administered 2013-04-15: 50 mg via INTRAVENOUS
  Administered 2013-04-15: 25 mg via INTRAVENOUS

## 2013-04-15 MED ORDER — STERILE WATER FOR IRRIGATION IR SOLN
Status: DC | PRN
  Administered 2013-04-15: 12:00:00

## 2013-04-15 MED ORDER — MEPERIDINE HCL 100 MG/ML IJ SOLN
INTRAMUSCULAR | Status: AC
  Filled 2013-04-15: qty 1

## 2013-04-15 MED ORDER — ONDANSETRON HCL 4 MG/2ML IJ SOLN
INTRAMUSCULAR | Status: AC
  Filled 2013-04-15: qty 2

## 2013-04-15 MED ORDER — SODIUM CHLORIDE 0.9 % IV SOLN
INTRAVENOUS | Status: DC
  Administered 2013-04-15: 1000 mL via INTRAVENOUS

## 2013-04-15 MED ORDER — ONDANSETRON HCL 4 MG/2ML IJ SOLN
INTRAMUSCULAR | Status: DC | PRN
  Administered 2013-04-15: 4 mg via INTRAVENOUS

## 2013-04-15 MED ORDER — MIDAZOLAM HCL 5 MG/5ML IJ SOLN
INTRAMUSCULAR | Status: DC | PRN
  Administered 2013-04-15 (×2): 2 mg via INTRAVENOUS
  Administered 2013-04-15: 1 mg via INTRAVENOUS

## 2013-04-15 NOTE — H&P (View-Only) (Signed)
Referring Provider: Avon Gully, MD Primary Care Physician:  Avon Gully, MD Primary GI: Dr. Jena Gauss   Chief Complaint  Patient presents with  . Colonoscopy  . Weight Gain    has area of concern in her abd.    HPI:   57 year old female presents today to schedule surveillance colonoscopy. History of multiple tubular adenomas on colonoscopy May 2011, due for surveillance now.   History of constipation, takes Miralax, then diarrhea. Trying to eat right, drink plenty of water. Once a week has the diarrhea. Doesn't really want to try anything for constipation currently. Worried about abdominal mass/hernia. Occasional discomfort at hernia site. Stopped probiotics around Christmas last year. States hernia is a nuisance. Anxiety regarding it. Feels like clothes aren't fitting. No rectal bleeding. Mild nausea if hungry. Improved after eating.   Past Medical History  Diagnosis Date  . HTN (hypertension)   . Chest pain, unspecified   . Hyperlipidemia   . DM (diabetes mellitus)     Borderline  . Asthma   . Seasonal allergies   . Chronic constipation   . Umbilical hernia   . Diastasis recti   . S/P endoscopy May 2011    severe erosive reflux esophagitis, noncritial schatzki's ring, +h.pylori  . S/P colonoscopy May 2011    multiple tubular adenomas: next TCS May 2014  . Helicobacter pylori gastritis     s/p treatment may 2011    Past Surgical History  Procedure Laterality Date  . Cholecystectomy  1999  . Dilation and curettage of uterus    . Tubal ligation    . Colonoscopy  May 2011    Dr. Jena Gauss: long, torturous colon, multiple colonic polyps. TUBULAR ADENOMAS  . Esophagogastroduodenoscopy  May 2011    Dr. Jena Gauss: severe erosive reflux esophagitis, non-critical Schatzki's ring, diffuse gastric erosions. H. PYLORI GASTRITIS    Current Outpatient Prescriptions  Medication Sig Dispense Refill  . acetaminophen (TYLENOL) 650 MG CR tablet Take 650 mg by mouth every 8 (eight) hours  as needed for pain.      Marland Kitchen amLODipine (NORVASC) 10 MG tablet Take 10 mg by mouth daily.        Marland Kitchen aspirin 81 MG tablet Take 81 mg by mouth daily.        Marland Kitchen atenolol-chlorthalidone (TENORETIC) 50-25 MG per tablet Take 1 tablet by mouth daily.        . Fluticasone-Salmeterol (ADVAIR DISKUS) 100-50 MCG/DOSE AEPB Inhale 1 puff into the lungs every 12 (twelve) hours.        Marland Kitchen loratadine (CLARITIN) 10 MG tablet Take 10 mg by mouth daily.        . metFORMIN (GLUCOPHAGE) 850 MG tablet Take 850 mg by mouth 2 (two) times daily with a meal.        . omeprazole (PRILOSEC) 20 MG capsule Take 20 mg by mouth daily.      . ONGLYZA 5 MG TABS tablet Take 5 mg by mouth daily.      . polyethylene glycol powder (MIRALAX) powder Take 17 g by mouth daily as needed.  527 g  11  . simvastatin (ZOCOR) 40 MG tablet Take 40 mg by mouth at bedtime.        . polyethylene glycol-electrolytes (TRILYTE) 420 G solution Take 4,000 mLs by mouth as directed.  4000 mL  0   No current facility-administered medications for this visit.    Allergies as of 04/05/2013  . (No Known Allergies)    Family History  Problem Relation Age  of Onset  . Colon cancer Maternal Uncle   . Colon cancer Paternal Uncle     History   Social History  . Marital Status: Divorced    Spouse Name: N/A    Number of Children: N/A  . Years of Education: N/A   Social History Main Topics  . Smoking status: Never Smoker   . Smokeless tobacco: None  . Alcohol Use: No  . Drug Use: No  . Sexually Active: None   Other Topics Concern  . None   Social History Narrative  . None    Review of Systems: Negative unless mentioned in HPI.   Physical Exam: BP 136/87  Pulse 75  Temp(Src) 98.8 F (37.1 C) (Oral)  Ht 5\' 4"  (1.626 m)  Wt 215 lb 12.8 oz (97.886 kg)  BMI 37.02 kg/m2 General:   Alert and oriented. No distress noted. Pleasant and cooperative.  Head:  Normocephalic and atraumatic. Eyes:  Conjuctiva clear without scleral icterus. Mouth:   Oral mucosa pink and moist. Good dentition. No lesions. Neck:  Supple, without mass or thyromegaly. Heart:  S1, S2 present without murmurs, rubs, or gallops. Regular rate and rhythm. Abdomen:  +BS, soft, non-tender and non-distended. Definite umbilical hernia noted, possible ventral hernia/rectus diastasis noted with valsalva. Mild tenderness. Large AP diameter.  Msk:  Symmetrical without gross deformities. Normal posture. Extremities:  Without edema. Neurologic:  Alert and  oriented x4;  grossly normal neurologically. Skin:  Intact without significant lesions or rashes. Psych:  Alert and cooperative. Normal mood and affect.

## 2013-04-15 NOTE — Interval H&P Note (Signed)
History and Physical Interval Note:  04/15/2013 12:17 PM  Mary Robinson  has presented today for surgery, with the diagnosis of COLON POLYPS  The various methods of treatment have been discussed with the patient and family. After consideration of risks, benefits and other options for treatment, the patient has consented to  Procedure(s) with comments: COLONOSCOPY (N/A) - 11:30-moved to 12:15 Soledad Gerlach to notify pt as a surgical intervention .  The patient's history has been reviewed, patient examined, no change in status, stable for surgery.  I have reviewed the patient's chart and labs.  Questions were answered to the patient's satisfaction.      Hernia findings on recent CT noted.  Surveillance colonoscopy per plan. The risks, benefits, limitations, alternatives and imponderables have been reviewed with the patient. Questions have been answered. All parties are agreeable.   Eula Listen

## 2013-04-15 NOTE — Op Note (Signed)
[facility] [address] [place], [address]   COLONOSCOPY PROCEDURE REPORT  PATIENT: Mary Robinson, Mary Robinson  MR#:         [id number] BIRTHDATE: Jul 26, 1956 , 57  yrs. old GENDER: Female ENDOSCOPIST: R.  Roetta Sessions, MD FACP FACG REFERRED BY:  Glenice Laine, M.D.  Claud Kelp, M.D. PROCEDURE DATE:  04/15/2013 PROCEDURE:     Colonoscopy with snare polypectomy  INDICATIONS:  History of multiple colonic polyps removed in 2011  INFORMED CONSENT:  The risks, benefits, alternatives and imponderables including but not limited to bleeding, perforation as well as the possibility of a missed lesion have been reviewed.  The potential for biopsy, lesion removal, etc. have also been discussed.  Questions have been answered.  All parties agreeable. Please see the history and physical in the medical record for more information.  MEDICATIONS: Versed 5 mg IV and Demerol 75 mg IV in divided doses. Zofran 4 mg IV  DESCRIPTION OF PROCEDURE:  After a digital rectal exam was performed, the EC-[id number] ([id number])  colonoscope was advanced from the anus through the rectum and colon to the area of the cecum, ileocecal valve and appendiceal orifice.  The cecum was deeply intubated.  These structures were well-seen and photographed for the record.  From the level of the cecum and ileocecal valve, the scope was slowly and cautiously withdrawn.  The mucosal surfaces were carefully surveyed utilizing scope tip deflection to facilitate fold flattening as needed.  The scope was pulled down into the rectum where a thorough examination including retroflexion was performed.    FINDINGS:  Adequate preparation. Normal rectum. Tortuous colon.              eSigned:  R. Roetta Sessions, MD FACP [facility] 04/15/2013 1:07 PM   CC:

## 2013-04-16 ENCOUNTER — Encounter: Payer: Self-pay | Admitting: Internal Medicine

## 2013-04-16 ENCOUNTER — Other Ambulatory Visit: Payer: Self-pay

## 2013-04-16 ENCOUNTER — Other Ambulatory Visit: Payer: Self-pay | Admitting: Gastroenterology

## 2013-04-16 DIAGNOSIS — K760 Fatty (change of) liver, not elsewhere classified: Secondary | ICD-10-CM

## 2013-04-19 ENCOUNTER — Encounter (HOSPITAL_COMMUNITY): Payer: Self-pay | Admitting: Internal Medicine

## 2013-04-26 ENCOUNTER — Ambulatory Visit (INDEPENDENT_AMBULATORY_CARE_PROVIDER_SITE_OTHER): Payer: Medicare Other | Admitting: General Surgery

## 2013-04-26 ENCOUNTER — Encounter (INDEPENDENT_AMBULATORY_CARE_PROVIDER_SITE_OTHER): Payer: Self-pay | Admitting: General Surgery

## 2013-04-26 VITALS — BP 130/84 | HR 78 | Resp 16 | Ht 64.0 in | Wt 212.0 lb

## 2013-04-26 DIAGNOSIS — K429 Umbilical hernia without obstruction or gangrene: Secondary | ICD-10-CM

## 2013-04-26 DIAGNOSIS — K469 Unspecified abdominal hernia without obstruction or gangrene: Secondary | ICD-10-CM

## 2013-04-26 NOTE — Progress Notes (Addendum)
Patient ID: Mary Robinson, female   DOB: 1955-12-09, 57 y.o.   MRN: [id number]  Chief Complaint  Patient presents with  . New Evaluation    eval abd/umb hernia    HPI Mary Robinson is a 57 y.o. female.  She is referred by Dr. Roetta Sessions in [place] for evaluation of abdominal wall hernia. Her primary care physician is Dr. Avon Gully.   Past surgical history is significant for a laparoscopic cholecystectomy in [place] by Dr. Marcell Anger. She also says that she's had a bilateral tubal ligation. She has noticed a bulge at her umbilicus for about 3 years and this was actually noted on CT scan in 2011. She says is getting bigger and is a little bit uncomfortable. She also has some back pain which is unrelated to her hernias. She also notices a bulge in her upper abdomen which is felt to be due to a diastases recti.  Recent CT scan shows a midline supraumbilical hernia 3.2 cm containing fat only. A second hernia at the umbilical air area which looks a little bigger than a third hernia in the midline below the umbilicus. These are all in tandem and close to one another.  Dr. Jena Gauss performed a colonoscopy on 04/15/2013. He found some polyps which were found to be benign villous adenomas and were removed. She has chronic constipation. She has a history of H. Pylori gastritis and GERD. Has a history of hypertension. BMI 37. Never smoked. She has been slowly gaining weight over the past 5 years. HPI  Past Medical History  Diagnosis Date  . HTN (hypertension)   . Chest pain, unspecified   . Hyperlipidemia   . DM (diabetes mellitus)     Borderline  . Asthma   . Seasonal allergies   . Chronic constipation   . Umbilical hernia   . Diastasis recti   . S/P endoscopy May 2011    severe erosive reflux esophagitis, noncritial schatzki's ring, +h.pylori  . S/P colonoscopy May 2011    multiple tubular adenomas: next TCS May 2014  . Helicobacter pylori gastritis     s/p treatment may 2011     Past Surgical History  Procedure Laterality Date  . Cholecystectomy  1999  . Dilation and curettage of uterus    . Tubal ligation    . Colonoscopy  May 2011    Dr. Jena Gauss: long, torturous colon, multiple colonic polyps. TUBULAR ADENOMAS  . Esophagogastroduodenoscopy  May 2011    Dr. Jena Gauss: severe erosive reflux esophagitis, non-critical Schatzki's ring, diffuse gastric erosions. H. PYLORI GASTRITIS  . Colonoscopy N/A 04/15/2013    Procedure: COLONOSCOPY;  Surgeon: Corbin Ade, MD;  Location: [facility];  Service: Endoscopy;  Laterality: N/A;  11:30-moved to 12:15 Soledad Gerlach to notify pt    Family History  Problem Relation Age of Onset  . Colon cancer Maternal Uncle   . Colon cancer Paternal Uncle     Social History History  Substance Use Topics  . Smoking status: Never Smoker   . Smokeless tobacco: Never Used  . Alcohol Use: No    No Known Allergies  Current Outpatient Prescriptions  Medication Sig Dispense Refill  . acetaminophen (TYLENOL) 650 MG CR tablet Take 650 mg by mouth every 8 (eight) hours as needed for pain.      Marland Kitchen amLODipine (NORVASC) 10 MG tablet Take 10 mg by mouth daily.        Marland Kitchen aspirin 81 MG tablet Take 81 mg by mouth daily.        Marland Kitchen  atenolol-chlorthalidone (TENORETIC) 50-25 MG per tablet Take 1 tablet by mouth daily.        . Fluticasone-Salmeterol (ADVAIR DISKUS) 100-50 MCG/DOSE AEPB Inhale 1 puff into the lungs every 12 (twelve) hours.        Marland Kitchen loratadine (CLARITIN) 10 MG tablet Take 10 mg by mouth daily.        . metFORMIN (GLUCOPHAGE) 850 MG tablet Take 850 mg by mouth 2 (two) times daily with a meal.        . omeprazole (PRILOSEC) 20 MG capsule Take 20 mg by mouth daily.      . ONGLYZA 5 MG TABS tablet Take 5 mg by mouth daily.      . polyethylene glycol powder (MIRALAX) powder Take 17 g by mouth daily as needed.  527 g  11  . polyethylene glycol-electrolytes (TRILYTE) 420 G solution Take 4,000 mLs by mouth as directed.  4000 mL  0  .  simvastatin (ZOCOR) 40 MG tablet Take 40 mg by mouth at bedtime.         No current facility-administered medications for this visit.    Review of Systems Review of Systems  Constitutional: Negative for fever, chills and unexpected weight change.  HENT: Negative for hearing loss, congestion, sore throat, trouble swallowing and voice change.   Eyes: Negative for visual disturbance.  Respiratory: Negative for cough and wheezing.   Cardiovascular: Negative for chest pain, palpitations and leg swelling.  Gastrointestinal: Positive for abdominal pain and constipation. Negative for nausea, vomiting, diarrhea, blood in stool, abdominal distention and anal bleeding.  Genitourinary: Negative for hematuria, vaginal bleeding and difficulty urinating.  Musculoskeletal: Positive for back pain. Negative for arthralgias.  Skin: Negative for rash and wound.  Neurological: Negative for seizures, syncope and headaches.  Hematological: Negative for adenopathy. Does not bruise/bleed easily.  Psychiatric/Behavioral: Negative for confusion.    Blood pressure 130/84, pulse 78, resp. rate 16, height 5\' 4"  (1.626 m), weight 212 lb (96.163 kg).  Physical Exam Physical Exam  Constitutional: She is oriented to person, place, and time. She appears well-developed and well-nourished. No distress.  BMI 37,  HENT:  Head: Normocephalic and atraumatic.  Nose: Nose normal.  Mouth/Throat: No oropharyngeal exudate.  Eyes: Conjunctivae and EOM are normal. Pupils are equal, round, and reactive to light. Left eye exhibits no discharge. No scleral icterus.  Neck: Neck supple. No JVD present. No tracheal deviation present. No thyromegaly present.  Cardiovascular: Normal rate, regular rhythm, normal heart sounds and intact distal pulses.   No murmur heard. Pulmonary/Chest: Effort normal and breath sounds normal. No respiratory distress. She has no wheezes. She has no rales. She exhibits no tenderness.  Abdominal: Soft.  Bowel sounds are normal. She exhibits no distension and no mass. There is no tenderness. There is no rebound and no guarding.    Short Incision in the midline just above umbilicus. Hernia bulge at the umbilicus. Only partially reducible. No other masses noted.diastases recti.  Genitourinary:  No inguinal mass, and no inguinal hernia.  Musculoskeletal: She exhibits no edema and no tenderness.  Lymphadenopathy:    She has no cervical adenopathy.  Neurological: She is alert and oriented to person, place, and time. She exhibits normal muscle tone. Coordination normal.  Skin: Skin is warm. No rash noted. She is not diaphoretic. No erythema. No pallor.  Psychiatric: She has a normal mood and affect. Her behavior is normal. Judgment and thought content normal.    Data Reviewed Colonoscopy report. Colonoscopy pathology. Dr. Achille Rich office  notes. CT scan and CT scan report.  Assessment    Multiple ventral incisional hernias limited to the midline around the umbilicus becoming symptomatic. Patient desires repair  Diastases recti   Obesity  Hypertension  Villous adenomas of the colon, resected colonoscopically  Chronic constipation  History H. Pylori gastritis  Status post laparoscopic cholecystectomy and laparoscopic tubal ligation  Chronic back pain, unrelated to the hernia    Plan    Had lengthy discussion with the patient about her CT scan findings and the hernias and the natural history of this. She was told of the surgery is elective. It was her choice to go ahead and have this performed at this time  I think she is a good candidate for a laparoscopic repair. She'll be scheduled for laparoscopic repair of her ventral incisional hernias with mesh, possible open repair in the near future  I discussed the indications, details, techniques, and numerous risk of the surgery with her. She is aware of the risk of bleeding, infection, recurrence, conversion of the laparotomy, injury to  the intestine was measured reconstructive intervention, and other unforeseen problems. I discussed disability issues with her. She seems to understand all these issues and all her questions were answered. She agrees with this plan.   Weight reduction is strongly encouraged.       Angelia Mould. Derrell Lolling, M.D., [facility] General and Minimally invasive Surgery Breast and Colorectal Surgery Office:   [phone] Pager:   [phone]  04/26/2013, 3:55 PM

## 2013-04-26 NOTE — Patient Instructions (Signed)
You have 3 small to medium size incisional hernias in your abdominal wall around the umbilicus. There is no sign of any other disease within your abdomen on the CT scan.  These hernias are probably going to slowly enlarge and probably will slowly become more uncomfortable over time.  You have decided to go ahead with an elective operation to repair the hernias and strengthen the repair with mesh.  You'll be scheduled for a laparoscopic repair of your multiple incisional hernias with mesh, possible open repair.     Laparoscopic Ventral Hernia Repair Laparoscopic ventral hernia repairis a surgery to fix a ventral hernia. Aventral hernia, also called an incisional hernia, is a bulge of body tissue or intestines that pushes through the front part of the abdomen. This can happen if the connective tissue covering the muscles over the abdomen has a weak spot or is torn because of a surgical cut (incision) from a previous surgery. Laparoscopic ventral hernia repair is often done soon after diagnosis to stop the hernia from getting bigger, becoming uncomfortable, or becoming an emergency. This surgery usually takes about 2 hours, but the time can vary greatly. LET YOUR CAREGIVER KNOW ABOUT:  Any allergies you have.  All medicines you are taking, including steroids, vitamins, herbs, eyedrops, and over-the-counter medicines and creams.  Previous problems you or members of your family have had with the use of anesthetics.  Any blood disorders or bleeding problems you have had.  Past surgeries you have had.  Other health problems you have. RISKS AND COMPLICATIONS  Generally, laparoscopic ventral hernia repair is a safe procedure. However, as with any surgical procedure, complications can occur. Possible complications include:  Bleeding.  Trouble passing urine or having a bowel movement after the operation.  Infection.  Pneumonia.  Blood clots.  Pain in the area of the hernia.  A bulge  in the area of the hernia that may be caused by a collection of fluid.  Injury to intestines or other structures in the abdomen.  Return of the hernia after surgery. In some cases, the caregiver may need to stop the laparoscopic procedure and do regular, open surgery. This may be necessary for very difficult hernias, when organs are hard to see, or when bleeding problems occur during surgery. BEFORE THE PROCEDURE   You may need to have blood tests, urine tests, a chest X-ray, or electrocardiography done before the day of the surgery.  Ask your caregiver about changing or stopping your regular medicines.  You may need to wash with a special type of germ-killing soap.  Do not eat or drink anything for at least 6 hours before the surgery.  Make plans to have someone drive you home after the procedure. PROCEDURE   Small monitors will be put on your body. They are used to check your heart, blood pressure, and oxygen level.  An intravenous (IV) access tube will be put into a vein in your hand or arm. Fluids and medicine will flow directly into your body through the IV tube.  You will be given medicine to make you sleep through the procedure (general anesthetic).  Your abdomen will be cleaned with a special soap to kill any germs on your skin.  Once you are asleep, several small incisions will be made in your abdomen.  The large space in your abdomen will be filled with air so that it expands. This gives the caregiver more room and a better view.  A thin, lighted tube with a tiny camera  on the end (laparoscope) is put through a small incision in your abdomen. The camera on the laparoscope sends a picture to a TV screen in the operating room. This gives the caregiver a good view inside the abdomen.  Hollow tubes are put through the other small incisions in your abdomen. The tools needed for the procedure are put through these tubes.  The caregiver puts the tissue or intestines that formed  the hernia back in place.  A screen-like patch (mesh) is used to close the hernia. This helps make the area stronger. Stitches, tacks, or staples are used to keep the mesh in place.  Medicine and a bandage (dressing) or skin glue will be put over the incisions. AFTER THE PROCEDURE   You will stay in a recovery area until the anesthetic wears off. Your blood pressure and pulse will be checked often.  You may be able to go home the same day or may need to stay in the hospital for 1 or 2 days after this surgery. Your caregiver will decide when you can go home.  You may feel some pain. You will likely be given medicine for pain.  You will be urged to do breathing exercises that involve taking deep breaths. This helps prevent a lung infection after a surgery.  You may have to wear compression stockings while you are in the hospital. These stockings help keep blood clots from forming in your legs. Document Released: 08/05/2012 Document Reviewed: 06/10/2012 [facility] Patient Information 2014 [place], [place].

## 2013-04-27 ENCOUNTER — Encounter (HOSPITAL_COMMUNITY): Payer: Self-pay | Admitting: Pharmacy Technician

## 2013-04-27 ENCOUNTER — Encounter (INDEPENDENT_AMBULATORY_CARE_PROVIDER_SITE_OTHER): Payer: Self-pay

## 2013-04-30 ENCOUNTER — Encounter (HOSPITAL_COMMUNITY): Payer: Self-pay

## 2013-04-30 ENCOUNTER — Encounter (HOSPITAL_COMMUNITY)
Admission: RE | Admit: 2013-04-30 | Discharge: 2013-04-30 | Disposition: A | Discharge status: Home / Self Care | Payer: Medicare Other | Source: Ambulatory Visit | Attending: General Surgery | Admitting: General Surgery

## 2013-04-30 DIAGNOSIS — Z01818 Encounter for other preprocedural examination: Secondary | ICD-10-CM | POA: Insufficient documentation

## 2013-04-30 DIAGNOSIS — Z01812 Encounter for preprocedural laboratory examination: Secondary | ICD-10-CM | POA: Insufficient documentation

## 2013-04-30 DIAGNOSIS — Z01810 Encounter for preprocedural cardiovascular examination: Secondary | ICD-10-CM | POA: Insufficient documentation

## 2013-04-30 HISTORY — DX: Gastro-esophageal reflux disease without esophagitis: K21.9

## 2013-04-30 HISTORY — DX: Unspecified osteoarthritis, unspecified site: M19.90

## 2013-04-30 HISTORY — DX: Anxiety disorder, unspecified: F41.9

## 2013-04-30 LAB — CBC WITH DIFFERENTIAL/PLATELET
Basophils Relative: 0 % (ref 0–1)
Hemoglobin: 12.6 g/dL (ref 12.0–15.0)
Lymphs Abs: 2.7 10*3/uL (ref 0.7–4.0)
MCHC: 32.6 g/dL (ref 30.0–36.0)
Monocytes Relative: 6 % (ref 3–12)
Neutro Abs: 3.5 10*3/uL (ref 1.7–7.7)
Neutrophils Relative %: 49 % (ref 43–77)
RBC: 5.01 MIL/uL (ref 3.87–5.11)

## 2013-04-30 LAB — COMPREHENSIVE METABOLIC PANEL
ALT: 38 U/L — ABNORMAL HIGH (ref 0–35)
Albumin: 3.8 g/dL (ref 3.5–5.2)
Alkaline Phosphatase: 72 U/L (ref 39–117)
BUN: 12 mg/dL (ref 6–23)
Chloride: 96 mEq/L (ref 96–112)
Potassium: 3.2 mEq/L — ABNORMAL LOW (ref 3.5–5.1)
Total Bilirubin: 0.2 mg/dL — ABNORMAL LOW (ref 0.3–1.2)

## 2013-04-30 LAB — URINALYSIS, ROUTINE W REFLEX MICROSCOPIC
Bilirubin Urine: NEGATIVE
Glucose, UA: NEGATIVE mg/dL
Hgb urine dipstick: NEGATIVE
Ketones, ur: NEGATIVE mg/dL
Leukocytes, UA: NEGATIVE
Protein, ur: NEGATIVE mg/dL

## 2013-04-30 NOTE — Pre-Procedure Instructions (Signed)
Mary Robinson  04/30/2013   Your procedure is scheduled on:  05/04/2013- TUESDAY  Report to [facility] at 10:00 AM.  Call this number if you have problems the morning of surgery: [phone]   Remember: NO MORE ASPIRIN   Do not eat food or drink liquids after midnight. After Monday NIGHT  Take these medicines the morning of surgery with A SIP OF WATER: AMLODIPINE, ATENOLOL, CLARITIN, OMEPRAZOLE, use ADVAIR   Do not wear jewelry, make-up or nail polish.  Do not wear lotions, powders, or perfumes. You may wear deodorant.  Do not shave 48 hours prior to surgery.   Do not bring valuables to the hospital.  [facility] is not responsible                   for any belongings or valuables.  Contacts, dentures or bridgework may not be worn into surgery.  Leave suitcase in the car. After surgery it may be brought to your room.  For patients admitted to the hospital, checkout time is 11:00 AM the day of  discharge.   Patients discharged the day of surgery will not be allowed to drive  home.  Name and phone number of your driver: /w family  Special Instructions: Shower using CHG 2 nights before surgery and the night before surgery.  If you shower the day of surgery use CHG.  Use special wash - you have one bottle of CHG for all showers.  You should use approximately 1/3 of the bottle for each shower.   Please read over the following fact sheets that you were given: Pain Booklet, Coughing and Deep Breathing and Surgical Site Infection Prevention

## 2013-04-30 NOTE — H&P (Signed)
Mary Robinson   MRN:  [id number]   Description: 57 year old female  Provider: Ernestene Mention, MD  Department: [facility]        Diagnoses    Abdominal hernia    -  Primary    553.9    Umbilical hernia without mention of obstruction or gangrene        553.1      Reason for Visit    New Evaluation    eval abd/umb hernia        Current Vitals - Last Recorded    BP Pulse Resp Ht Wt BMI    130/84 78 16 5\' 4"  (1.626 m) 212 lb (96.163 kg) 36.37 kg/m2       History and Physical   Ernestene Mention, MD     Status: Signed                         HPI Mary Robinson is a 57 y.o. female.  She is referred by Dr. Roetta Sessions in [place] for evaluation of abdominal wall hernia. Her primary care physician is Dr. Avon Gully.    Past surgical history is significant for a laparoscopic cholecystectomy indeed in by Dr. Marcell Anger. It also says that she's had a bilateral tubal ligation. She has noticed a bulge at her umbilicus for about 3 years and this was actually noted on CT scan in 2011. She says is getting bigger and is a little bit uncomfortable. She also has some back pain which is unrelated to her hernias. She also notices a bulge in her upper abdomen which is felt to be due to a diastases recti.   Recent CT scan shows a midline supraumbilical hernia 3.2 cm containing fat only. A second hernia at the umbilical air area which looks a little bigger than a third hernia in the midline below the umbilicus. These are all in tandem and close to one another.   Dr. Jena Gauss performed a colonoscopy on 04/15/2013. He found some polyps which were found to be benign villous adenomas and were removed. She has chronic constipation. She has a history of H. Pylori gastritis and GERD. Has a history of hypertension. BMI 37. Never smoked. She has been slowly gaining weight over the past 5 years.        Past Medical History   Diagnosis  Date   .  HTN (hypertension)     .   Chest pain, unspecified     .  Hyperlipidemia     .  DM (diabetes mellitus)         Borderline   .  Asthma     .  Seasonal allergies     .  Chronic constipation     .  Umbilical hernia     .  Diastasis recti     .  S/P endoscopy  May 2011       severe erosive reflux esophagitis, noncritial schatzki's ring, +h.pylori   .  S/P colonoscopy  May 2011       multiple tubular adenomas: next TCS May 2014   .  Helicobacter pylori gastritis         s/p treatment may 2011         Past Surgical History   Procedure  Laterality  Date   .  Cholecystectomy    1999   .  Dilation and curettage of uterus       .  Tubal ligation       .  Colonoscopy    May 2011       Dr. Jena Gauss: long, torturous colon, multiple colonic polyps. TUBULAR ADENOMAS   .  Esophagogastroduodenoscopy    May 2011       Dr. Jena Gauss: severe erosive reflux esophagitis, non-critical Schatzki's ring, diffuse gastric erosions. H. PYLORI GASTRITIS   .  Colonoscopy  N/A  04/15/2013       Procedure: COLONOSCOPY;  Surgeon: Corbin Ade, MD;  Location: [facility];  Service: Endoscopy;  Laterality: N/A;  11:30-moved to 12:15 Soledad Gerlach to notify pt         Family History   Problem  Relation  Age of Onset   .  Colon cancer  Maternal Uncle     .  Colon cancer  Paternal Uncle          Social History History   Substance Use Topics   .  Smoking status:  Never Smoker    .  Smokeless tobacco:  Never Used   .  Alcohol Use:  No        No Known Allergies    Current Outpatient Prescriptions   Medication  Sig  Dispense  Refill   .  acetaminophen (TYLENOL) 650 MG CR tablet  Take 650 mg by mouth every 8 (eight) hours as needed for pain.         Marland Kitchen  amLODipine (NORVASC) 10 MG tablet  Take 10 mg by mouth daily.           Marland Kitchen  aspirin 81 MG tablet  Take 81 mg by mouth daily.           Marland Kitchen  atenolol-chlorthalidone (TENORETIC) 50-25 MG per tablet  Take 1 tablet by mouth daily.           .  Fluticasone-Salmeterol (ADVAIR DISKUS) 100-50  MCG/DOSE AEPB  Inhale 1 puff into the lungs every 12 (twelve) hours.           Marland Kitchen  loratadine (CLARITIN) 10 MG tablet  Take 10 mg by mouth daily.           .  metFORMIN (GLUCOPHAGE) 850 MG tablet  Take 850 mg by mouth 2 (two) times daily with a meal.           .  omeprazole (PRILOSEC) 20 MG capsule  Take 20 mg by mouth daily.         .  ONGLYZA 5 MG TABS tablet  Take 5 mg by mouth daily.         .  polyethylene glycol powder (MIRALAX) powder  Take 17 g by mouth daily as needed.   527 g   11   .  polyethylene glycol-electrolytes (TRILYTE) 420 G solution  Take 4,000 mLs by mouth as directed.   4000 mL   0   .  simvastatin (ZOCOR) 40 MG tablet  Take 40 mg by mouth at bedtime.                 Review of Systems   Constitutional: Negative for fever, chills and unexpected weight change.  HENT: Negative for hearing loss, congestion, sore throat, trouble swallowing and voice change.   Eyes: Negative for visual disturbance.  Respiratory: Negative for cough and wheezing.   Cardiovascular: Negative for chest pain, palpitations and leg swelling.  Gastrointestinal: Positive for abdominal pain and constipation. Negative for nausea, vomiting, diarrhea, blood in stool, abdominal distention and anal  bleeding.  Genitourinary: Negative for hematuria, vaginal bleeding and difficulty urinating.  Musculoskeletal: Positive for back pain. Negative for arthralgias.  Skin: Negative for rash and wound.  Neurological: Negative for seizures, syncope and headaches.  Hematological: Negative for adenopathy. Does not bruise/bleed easily.  Psychiatric/Behavioral: Negative for confusion.      Blood pressure 130/84, pulse 78, resp. rate 16, height 5\' 4"  (1.626 m), weight 212 lb (96.163 kg).   Physical Exam   Constitutional: She is oriented to person, place, and time. She appears well-developed and well-nourished. No distress.  BMI 37,  HENT:   Head: Normocephalic and atraumatic.   Nose: Nose normal.    Mouth/Throat: No oropharyngeal exudate.  Eyes: Conjunctivae and EOM are normal. Pupils are equal, round, and reactive to light. Left eye exhibits no discharge. No scleral icterus.  Neck: Neck supple. No JVD present. No tracheal deviation present. No thyromegaly present.  Cardiovascular: Normal rate, regular rhythm, normal heart sounds and intact distal pulses.    No murmur heard. Pulmonary/Chest: Effort normal and breath sounds normal. No respiratory distress. She has no wheezes. She has no rales. She exhibits no tenderness.  Abdominal: Soft. Bowel sounds are normal. She exhibits no distension and no mass. There is no tenderness. There is no rebound and no guarding.    Short Incision in the midline just above umbilicus. Hernia bulge at the umbilicus. Only partially reducible. No other masses noted.diastases recti.  Genitourinary:  No inguinal mass, and no inguinal hernia.  Musculoskeletal: She exhibits no edema and no tenderness.  Lymphadenopathy:    She has no cervical adenopathy.  Neurological: She is alert and oriented to person, place, and time. She exhibits normal muscle tone. Coordination normal.  Skin: Skin is warm. No rash noted. She is not diaphoretic. No erythema. No pallor.  Psychiatric: She has a normal mood and affect. Her behavior is normal. Judgment and thought content normal.      Data Reviewed Colonoscopy report. Colonoscopy pathology. Dr. Achille Rich office notes. CT scan and CT scan report.   Assessment    Multiple ventral incisional hernias limited to the midline around the umbilicus becoming symptomatic. Patient desires repair   Diastases recti    Obesity   Hypertension   Villous adenomas of the colon, resected colonoscopically   Chronic constipation   History H. Pylori gastritis   Status post laparoscopic cholecystectomy and laparoscopic tubal ligation   Chronic back pain, unrelated to the hernia     Plan    Had lengthy discussion with the  patient about her CT scan findings and the hernias and the natural history of this. She was told of the surgery is elective. It was her choice to go ahead and have this performed at this time   I think she is a good candidate for a laparoscopic repair. She'll be scheduled for laparoscopic repair of her ventral incisional hernias with mesh, possible open repair in the near future   I discussed the indications, details, techniques, and numerous risk of the surgery with her. She is aware of the risk of bleeding, infection, recurrence, conversion of the laparotomy, injury to the intestine was measured reconstructive intervention, and other unforeseen problems. I discussed disability issues with her. She seems to understand all these issues and all her questions were answered. She agrees with this plan.    Weight reduction is strongly encouraged.          Angelia Mould. Derrell Lolling, M.D., [facility] General and Minimally invasive Surgery Breast  and Colorectal Surgery Office:   [phone] Pager:   [phone]

## 2013-05-03 MED ORDER — DEXTROSE 5 % IV SOLN
3.0000 g | INTRAVENOUS | Status: AC
  Administered 2013-05-04: 3 g via INTRAVENOUS
  Filled 2013-05-03: qty 3000

## 2013-05-04 ENCOUNTER — Encounter (HOSPITAL_COMMUNITY)
Admission: RE | Disposition: A | Discharge status: Home / Self Care | Payer: Self-pay | Source: Ambulatory Visit | Attending: General Surgery

## 2013-05-04 ENCOUNTER — Encounter (HOSPITAL_COMMUNITY): Payer: Self-pay | Admitting: *Deleted

## 2013-05-04 ENCOUNTER — Observation Stay (HOSPITAL_COMMUNITY)
Admission: RE | Admit: 2013-05-04 | Discharge: 2013-05-07 | Disposition: A | Discharge status: Home / Self Care | Payer: Medicare Other | Source: Ambulatory Visit | Attending: General Surgery | Admitting: General Surgery

## 2013-05-04 ENCOUNTER — Ambulatory Visit (HOSPITAL_COMMUNITY): Payer: Medicare Other | Admitting: Critical Care Medicine

## 2013-05-04 ENCOUNTER — Encounter (HOSPITAL_COMMUNITY): Payer: Self-pay | Admitting: Critical Care Medicine

## 2013-05-04 DIAGNOSIS — K420 Umbilical hernia with obstruction, without gangrene: Secondary | ICD-10-CM | POA: Insufficient documentation

## 2013-05-04 DIAGNOSIS — E785 Hyperlipidemia, unspecified: Secondary | ICD-10-CM | POA: Insufficient documentation

## 2013-05-04 DIAGNOSIS — K469 Unspecified abdominal hernia without obstruction or gangrene: Secondary | ICD-10-CM | POA: Diagnosis present

## 2013-05-04 DIAGNOSIS — K430 Incisional hernia with obstruction, without gangrene: Secondary | ICD-10-CM

## 2013-05-04 DIAGNOSIS — R079 Chest pain, unspecified: Secondary | ICD-10-CM | POA: Insufficient documentation

## 2013-05-04 DIAGNOSIS — E119 Type 2 diabetes mellitus without complications: Secondary | ICD-10-CM | POA: Insufficient documentation

## 2013-05-04 DIAGNOSIS — I10 Essential (primary) hypertension: Secondary | ICD-10-CM | POA: Insufficient documentation

## 2013-05-04 DIAGNOSIS — K5900 Constipation, unspecified: Secondary | ICD-10-CM | POA: Insufficient documentation

## 2013-05-04 HISTORY — DX: Depression, unspecified: F32.A

## 2013-05-04 HISTORY — DX: Type 2 diabetes mellitus without complications: E11.9

## 2013-05-04 HISTORY — DX: Sleep apnea, unspecified: G47.30

## 2013-05-04 HISTORY — DX: Major depressive disorder, single episode, unspecified: F32.9

## 2013-05-04 HISTORY — PX: INCISIONAL HERNIA REPAIR: SHX193

## 2013-05-04 HISTORY — PX: INSERTION OF MESH: SHX5868

## 2013-05-04 HISTORY — DX: Other chronic pain: G89.29

## 2013-05-04 HISTORY — PX: HERNIA REPAIR: SHX51

## 2013-05-04 HISTORY — DX: Dorsalgia, unspecified: M54.9

## 2013-05-04 HISTORY — DX: Anemia, unspecified: D64.9

## 2013-05-04 LAB — CBC
HCT: 35.4 % — ABNORMAL LOW (ref 36.0–46.0)
Hemoglobin: 11.1 g/dL — ABNORMAL LOW (ref 12.0–15.0)
MCH: 24.4 pg — ABNORMAL LOW (ref 26.0–34.0)
MCHC: 31.4 g/dL (ref 30.0–36.0)

## 2013-05-04 LAB — GLUCOSE, CAPILLARY
Glucose-Capillary: 167 mg/dL — ABNORMAL HIGH (ref 70–99)
Glucose-Capillary: 184 mg/dL — ABNORMAL HIGH (ref 70–99)
Glucose-Capillary: 148 mg/dL — ABNORMAL HIGH (ref 70–99)

## 2013-05-04 SURGERY — REPAIR, HERNIA, INCISIONAL, LAPAROSCOPIC
Anesthesia: General | Site: Abdomen | Wound class: Clean

## 2013-05-04 MED ORDER — MORPHINE SULFATE 2 MG/ML IJ SOLN
2.0000 mg | INTRAMUSCULAR | Status: DC | PRN
  Administered 2013-05-04 – 2013-05-05 (×3): 2 mg via INTRAVENOUS
  Filled 2013-05-04 (×3): qty 1

## 2013-05-04 MED ORDER — CHLORTHALIDONE 25 MG PO TABS
25.0000 mg | ORAL_TABLET | Freq: Every day | ORAL | Status: DC
  Administered 2013-05-05 – 2013-05-07 (×3): 25 mg via ORAL
  Filled 2013-05-04 (×3): qty 1

## 2013-05-04 MED ORDER — INSULIN ASPART 100 UNIT/ML ~~LOC~~ SOLN
0.0000 [IU] | Freq: Three times a day (TID) | SUBCUTANEOUS | Status: DC
  Administered 2013-05-04: 4 [IU] via SUBCUTANEOUS
  Administered 2013-05-05 – 2013-05-06 (×4): 3 [IU] via SUBCUTANEOUS
  Administered 2013-05-06: 4 [IU] via SUBCUTANEOUS
  Administered 2013-05-07: 3 [IU] via SUBCUTANEOUS

## 2013-05-04 MED ORDER — ENOXAPARIN SODIUM 40 MG/0.4ML ~~LOC~~ SOLN
40.0000 mg | SUBCUTANEOUS | Status: DC
  Administered 2013-05-05 – 2013-05-07 (×3): 40 mg via SUBCUTANEOUS
  Filled 2013-05-04 (×4): qty 0.4

## 2013-05-04 MED ORDER — LINAGLIPTIN 5 MG PO TABS
5.0000 mg | ORAL_TABLET | Freq: Every day | ORAL | Status: DC
  Administered 2013-05-04 – 2013-05-07 (×4): 5 mg via ORAL
  Filled 2013-05-04 (×4): qty 1

## 2013-05-04 MED ORDER — PROMETHAZINE HCL 25 MG/ML IJ SOLN: 6.2500 mg | INTRAMUSCULAR | Status: DC | PRN

## 2013-05-04 MED ORDER — LORATADINE 10 MG PO TABS
10.0000 mg | ORAL_TABLET | Freq: Every day | ORAL | Status: DC
  Administered 2013-05-05 – 2013-05-07 (×3): 10 mg via ORAL
  Filled 2013-05-04 (×3): qty 1

## 2013-05-04 MED ORDER — KETOROLAC TROMETHAMINE 30 MG/ML IJ SOLN
INTRAMUSCULAR | Status: DC | PRN
  Administered 2013-05-04: 30 mg via INTRAVENOUS

## 2013-05-04 MED ORDER — MOMETASONE FURO-FORMOTEROL FUM 100-5 MCG/ACT IN AERO
2.0000 | INHALATION_SPRAY | Freq: Two times a day (BID) | RESPIRATORY_TRACT | Status: DC
  Administered 2013-05-05 – 2013-05-07 (×5): 2 via RESPIRATORY_TRACT
  Filled 2013-05-04: qty 8.8

## 2013-05-04 MED ORDER — ONDANSETRON HCL 4 MG PO TABS: 4.0000 mg | ORAL_TABLET | Freq: Four times a day (QID) | ORAL | Status: DC | PRN

## 2013-05-04 MED ORDER — ONDANSETRON HCL 4 MG/2ML IJ SOLN
INTRAMUSCULAR | Status: DC | PRN
  Administered 2013-05-04: 4 mg via INTRAVENOUS

## 2013-05-04 MED ORDER — HYDROMORPHONE HCL PF 1 MG/ML IJ SOLN
0.2500 mg | INTRAMUSCULAR | Status: DC | PRN
  Administered 2013-05-04 (×2): 0.5 mg via INTRAVENOUS

## 2013-05-04 MED ORDER — SIMVASTATIN 40 MG PO TABS
40.0000 mg | ORAL_TABLET | Freq: Every day | ORAL | Status: DC
  Administered 2013-05-04 – 2013-05-06 (×3): 40 mg via ORAL
  Filled 2013-05-04 (×4): qty 1

## 2013-05-04 MED ORDER — FENTANYL CITRATE 0.05 MG/ML IJ SOLN
INTRAMUSCULAR | Status: DC | PRN
  Administered 2013-05-04 (×2): 50 ug via INTRAVENOUS
  Administered 2013-05-04: 100 ug via INTRAVENOUS
  Administered 2013-05-04: 50 ug via INTRAVENOUS

## 2013-05-04 MED ORDER — AMLODIPINE BESYLATE 10 MG PO TABS
10.0000 mg | ORAL_TABLET | Freq: Every day | ORAL | Status: DC
  Administered 2013-05-05 – 2013-05-07 (×3): 10 mg via ORAL
  Filled 2013-05-04 (×4): qty 1

## 2013-05-04 MED ORDER — BUPIVACAINE-EPINEPHRINE 0.25% -1:200000 IJ SOLN
INTRAMUSCULAR | Status: DC | PRN
  Administered 2013-05-04: 10 mL

## 2013-05-04 MED ORDER — HYDROMORPHONE HCL PF 1 MG/ML IJ SOLN
INTRAMUSCULAR | Status: AC
  Filled 2013-05-04: qty 1

## 2013-05-04 MED ORDER — BUPIVACAINE-EPINEPHRINE PF 0.25-1:200000 % IJ SOLN
INTRAMUSCULAR | Status: AC
  Filled 2013-05-04: qty 30

## 2013-05-04 MED ORDER — ASPIRIN 81 MG PO TABS: 81.0000 mg | ORAL_TABLET | Freq: Every day | ORAL | Status: DC

## 2013-05-04 MED ORDER — ATENOLOL 50 MG PO TABS
50.0000 mg | ORAL_TABLET | Freq: Every day | ORAL | Status: DC
  Administered 2013-05-05 – 2013-05-07 (×3): 50 mg via ORAL
  Filled 2013-05-04 (×3): qty 1

## 2013-05-04 MED ORDER — POTASSIUM CHLORIDE IN NACL 20-0.9 MEQ/L-% IV SOLN
INTRAVENOUS | Status: DC
  Administered 2013-05-04 – 2013-05-05 (×4): via INTRAVENOUS
  Filled 2013-05-04 (×5): qty 1000

## 2013-05-04 MED ORDER — 0.9 % SODIUM CHLORIDE (POUR BTL) OPTIME
TOPICAL | Status: DC | PRN
  Administered 2013-05-04: 3000 mL

## 2013-05-04 MED ORDER — CEFAZOLIN SODIUM 10 G IJ SOLR: 3.0000 g | Freq: Three times a day (TID) | INTRAMUSCULAR | Status: DC

## 2013-05-04 MED ORDER — CEFAZOLIN SODIUM 1-5 GM-% IV SOLN
1.0000 g | Freq: Three times a day (TID) | INTRAVENOUS | Status: AC
  Administered 2013-05-04 – 2013-05-05 (×3): 1 g via INTRAVENOUS
  Filled 2013-05-04 (×3): qty 50

## 2013-05-04 MED ORDER — MEPERIDINE HCL 25 MG/ML IJ SOLN: 6.2500 mg | INTRAMUSCULAR | Status: DC | PRN

## 2013-05-04 MED ORDER — ROCURONIUM BROMIDE 100 MG/10ML IV SOLN
INTRAVENOUS | Status: DC | PRN
  Administered 2013-05-04: 40 mg via INTRAVENOUS
  Administered 2013-05-04 (×2): 5 mg via INTRAVENOUS

## 2013-05-04 MED ORDER — MIDAZOLAM HCL 2 MG/2ML IJ SOLN: 0.5000 mg | Freq: Once | INTRAMUSCULAR | Status: DC | PRN

## 2013-05-04 MED ORDER — ATENOLOL-CHLORTHALIDONE 50-25 MG PO TABS: 1.0000 | ORAL_TABLET | Freq: Every day | ORAL | Status: DC

## 2013-05-04 MED ORDER — CHLORHEXIDINE GLUCONATE 4 % EX LIQD: 1.0000 "application " | Freq: Once | CUTANEOUS | Status: DC

## 2013-05-04 MED ORDER — GLYCOPYRROLATE 0.2 MG/ML IJ SOLN
INTRAMUSCULAR | Status: DC | PRN
  Administered 2013-05-04: .8 mg via INTRAVENOUS

## 2013-05-04 MED ORDER — ASPIRIN EC 81 MG PO TBEC
81.0000 mg | DELAYED_RELEASE_TABLET | Freq: Every day | ORAL | Status: DC
  Administered 2013-05-04 – 2013-05-07 (×4): 81 mg via ORAL
  Filled 2013-05-04 (×4): qty 1

## 2013-05-04 MED ORDER — OXYCODONE-ACETAMINOPHEN 5-325 MG PO TABS
1.0000 | ORAL_TABLET | ORAL | Status: DC | PRN
  Administered 2013-05-04 – 2013-05-05 (×2): 2 via ORAL
  Administered 2013-05-05: 1 via ORAL
  Administered 2013-05-05 – 2013-05-06 (×4): 2 via ORAL
  Filled 2013-05-04 (×4): qty 2
  Filled 2013-05-04: qty 1
  Filled 2013-05-04 (×3): qty 2

## 2013-05-04 MED ORDER — INSULIN GLARGINE 100 UNIT/ML ~~LOC~~ SOLN
10.0000 [IU] | Freq: Every day | SUBCUTANEOUS | Status: DC
  Administered 2013-05-04 – 2013-05-06 (×3): 10 [IU] via SUBCUTANEOUS
  Filled 2013-05-04 (×4): qty 0.1

## 2013-05-04 MED ORDER — METFORMIN HCL 850 MG PO TABS
850.0000 mg | ORAL_TABLET | Freq: Two times a day (BID) | ORAL | Status: DC
  Administered 2013-05-04 – 2013-05-07 (×6): 850 mg via ORAL
  Filled 2013-05-04 (×8): qty 1

## 2013-05-04 MED ORDER — PANTOPRAZOLE SODIUM 40 MG PO TBEC
40.0000 mg | DELAYED_RELEASE_TABLET | Freq: Every day | ORAL | Status: DC
  Administered 2013-05-05 – 2013-05-07 (×3): 40 mg via ORAL
  Filled 2013-05-04 (×3): qty 1

## 2013-05-04 MED ORDER — MIDAZOLAM HCL 5 MG/5ML IJ SOLN
INTRAMUSCULAR | Status: DC | PRN
  Administered 2013-05-04: 2 mg via INTRAVENOUS

## 2013-05-04 MED ORDER — PROPOFOL 10 MG/ML IV BOLUS
INTRAVENOUS | Status: DC | PRN
  Administered 2013-05-04: 200 mg via INTRAVENOUS

## 2013-05-04 MED ORDER — LIDOCAINE HCL (CARDIAC) 20 MG/ML IV SOLN
INTRAVENOUS | Status: DC | PRN
  Administered 2013-05-04: 100 mg via INTRAVENOUS

## 2013-05-04 MED ORDER — NEOSTIGMINE METHYLSULFATE 1 MG/ML IJ SOLN
INTRAMUSCULAR | Status: DC | PRN
  Administered 2013-05-04: 5 mg via INTRAVENOUS

## 2013-05-04 MED ORDER — LACTATED RINGERS IV SOLN
INTRAVENOUS | Status: DC
  Administered 2013-05-04 (×2): via INTRAVENOUS

## 2013-05-04 MED ORDER — ONDANSETRON HCL 4 MG/2ML IJ SOLN: 4.0000 mg | Freq: Four times a day (QID) | INTRAMUSCULAR | Status: DC | PRN

## 2013-05-04 SURGICAL SUPPLY — 42 items
APPLICATOR COTTON TIP 6IN STRL (MISCELLANEOUS) ×2 IMPLANT
APPLIER CLIP LOGIC TI 5 (MISCELLANEOUS) IMPLANT
BINDER ABD UNIV 10 28-50 (GAUZE/BANDAGES/DRESSINGS) ×3 IMPLANT
BINDER ABDOM UNIV 10 (GAUZE/BANDAGES/DRESSINGS) ×6
BLADE SURG ROTATE 9660 (MISCELLANEOUS) IMPLANT
CANISTER SUCTION 2500CC (MISCELLANEOUS) IMPLANT
CHLORAPREP W/TINT 26ML (MISCELLANEOUS) ×2 IMPLANT
CLOTH BEACON ORANGE TIMEOUT ST (SAFETY) ×2 IMPLANT
COVER SURGICAL LIGHT HANDLE (MISCELLANEOUS) ×2 IMPLANT
DECANTER SPIKE VIAL GLASS SM (MISCELLANEOUS) ×2 IMPLANT
DERMABOND ADVANCED (GAUZE/BANDAGES/DRESSINGS) ×1
DERMABOND ADVANCED .7 DNX12 (GAUZE/BANDAGES/DRESSINGS) ×1 IMPLANT
DEVICE SECURE STRAP 25 ABSORB (INSTRUMENTS) ×2 IMPLANT
DEVICE TROCAR PUNCTURE CLOSURE (ENDOMECHANICALS) ×2 IMPLANT
DRAPE UTILITY 15X26 W/TAPE STR (DRAPE) ×4 IMPLANT
DRSG PAD ABDOMINAL 8X10 ST (GAUZE/BANDAGES/DRESSINGS) ×2 IMPLANT
ELECT REM PT RETURN 9FT ADLT (ELECTROSURGICAL) ×2
ELECTRODE REM PT RTRN 9FT ADLT (ELECTROSURGICAL) ×1 IMPLANT
GLOVE EUDERMIC 7 POWDERFREE (GLOVE) ×2 IMPLANT
GOWN STRL NON-REIN LRG LVL3 (GOWN DISPOSABLE) ×4 IMPLANT
GOWN STRL REIN XL XLG (GOWN DISPOSABLE) ×2 IMPLANT
KIT BASIN OR (CUSTOM PROCEDURE TRAY) ×2 IMPLANT
KIT ROOM TURNOVER OR (KITS) ×2 IMPLANT
MARKER SKIN DUAL TIP RULER LAB (MISCELLANEOUS) ×2 IMPLANT
MESH VENTRALIGHT ST 8IN CRC (Mesh General) ×2 IMPLANT
NEEDLE SPNL 22GX3.5 QUINCKE BK (NEEDLE) ×2 IMPLANT
NS IRRIG 1000ML POUR BTL (IV SOLUTION) ×2 IMPLANT
PAD ARMBOARD 7.5X6 YLW CONV (MISCELLANEOUS) ×4 IMPLANT
SCALPEL HARMONIC ACE (MISCELLANEOUS) ×2 IMPLANT
SCISSORS LAP 5X35 DISP (ENDOMECHANICALS) IMPLANT
SET IRRIG TUBING LAPAROSCOPIC (IRRIGATION / IRRIGATOR) ×2 IMPLANT
SLEEVE ENDOPATH XCEL 5M (ENDOMECHANICALS) ×8 IMPLANT
SUT MNCRL AB 4-0 PS2 18 (SUTURE) ×2 IMPLANT
SUT MON AB 4-0 PC3 18 (SUTURE) ×2 IMPLANT
SUT NOVA NAB DX-16 0-1 5-0 T12 (SUTURE) ×4 IMPLANT
TACKER 5MM HERNIA 3.5CML NAB (ENDOMECHANICALS) ×2 IMPLANT
TOWEL OR 17X24 6PK STRL BLUE (TOWEL DISPOSABLE) ×2 IMPLANT
TOWEL OR 17X26 10 PK STRL BLUE (TOWEL DISPOSABLE) ×2 IMPLANT
TRAY FOLEY CATH 16FR SILVER (SET/KITS/TRAYS/PACK) IMPLANT
TRAY LAPAROSCOPIC (CUSTOM PROCEDURE TRAY) ×2 IMPLANT
TROCAR XCEL NON-BLD 11X100MML (ENDOMECHANICALS) ×2 IMPLANT
TROCAR XCEL NON-BLD 5MMX100MML (ENDOMECHANICALS) ×2 IMPLANT

## 2013-05-04 NOTE — Interval H&P Note (Signed)
History and Physical Interval Note:  05/04/2013 11:55 AM  Mary Robinson  has presented today for surgery, with the diagnosis of incisional hernia  The goals and the various methods of treatment have been discussed with the patient and family. After consideration of risks, benefits and other options for treatment, the patient has consented to  Procedure(s): LAPAROSCOPIC INCISIONAL HERNIA POSSIBLE OPEN (N/A) INSERTION OF MESH (N/A) as a surgical intervention .  The patient's history has been reviewed, patient examined today, no change in status, stable for surgery.  I have reviewed the patient's chart and labs.  Questions were answered to the patient's satisfaction.     Ernestene Mention

## 2013-05-04 NOTE — Progress Notes (Signed)
Lunch relief by S. Gregson RN 

## 2013-05-04 NOTE — Transfer of Care (Signed)
Immediate Anesthesia Transfer of Care Note  Patient: Mary Robinson  Procedure(s) Performed: Procedure(s): LAPAROSCOPIC INCISIONAL HERNIA  (N/A) INSERTION OF MESH (N/A)  Patient Location: PACU  Anesthesia Type:General  Level of Consciousness: awake and alert   Airway & Oxygen Therapy: Patient Spontanous Breathing and Patient connected to nasal cannula oxygen  Post-op Assessment: Report given to PACU RN, Post -op Vital signs reviewed and stable and Patient moving all extremities X 4  Post vital signs: Reviewed and stable  Complications: No apparent anesthesia complications

## 2013-05-04 NOTE — Preoperative (Signed)
Beta Blockers   Reason not to administer Beta Blockers:Not Applicable 

## 2013-05-04 NOTE — Anesthesia Procedure Notes (Signed)
Procedure Name: Intubation Date/Time: 05/04/2013 12:16 PM Performed by: Elon Alas Pre-anesthesia Checklist: Patient identified, Timeout performed, Emergency Drugs available, Suction available and Patient being monitored Patient Re-evaluated:Patient Re-evaluated prior to inductionOxygen Delivery Method: Circle system utilized Preoxygenation: Pre-oxygenation with 100% oxygen Intubation Type: IV induction Ventilation: Mask ventilation without difficulty and Oral airway inserted - appropriate to patient size Laryngoscope Size: Mac and 4 Grade View: Grade II Tube type: Oral Tube size: 7.5 mm Number of attempts: 1 Airway Equipment and Method: Stylet Placement Confirmation: positive ETCO2,  ETT inserted through vocal cords under direct vision and breath sounds checked- equal and bilateral Secured at: 22 cm Tube secured with: Tape Dental Injury: Teeth and Oropharynx as per pre-operative assessment

## 2013-05-04 NOTE — Anesthesia Postprocedure Evaluation (Signed)
Anesthesia Post Note  Patient: Mary Robinson  Procedure(s) Performed: Procedure(s) (LRB): LAPAROSCOPIC INCISIONAL HERNIA  (N/A) INSERTION OF MESH (N/A)  Anesthesia type: general  Patient location: PACU  Post pain: Pain level controlled  Post assessment: Patient's Cardiovascular Status Stable  Last Vitals:  Filed Vitals:   05/04/13 1530  BP:   Pulse: 65  Temp:   Resp: 15    Post vital signs: Reviewed and stable  Level of consciousness: sedated  Complications: No apparent anesthesia complications

## 2013-05-04 NOTE — Anesthesia Preprocedure Evaluation (Signed)
Anesthesia Evaluation  Patient identified by MRN, date of birth, ID band Patient awake    Reviewed: Allergy & Precautions, H&P , Patient's Chart, lab work & pertinent test results, reviewed documented beta blocker date and time   History of Anesthesia Complications Negative for: history of anesthetic complications  Airway Mallampati: III TM Distance: >3 FB Neck ROM: full    Dental no notable dental hx.    Pulmonary neg pulmonary ROS, asthma ,  breath sounds clear to auscultation  Pulmonary exam normal       Cardiovascular Exercise Tolerance: Good hypertension, negative cardio ROS  Rhythm:regular Rate:Normal     Neuro/Psych  Neuromuscular disease negative neurological ROS  negative psych ROS   GI/Hepatic negative GI ROS, Neg liver ROS, GERD-  Controlled,  Endo/Other  negative endocrine ROSdiabetesMorbid obesity  Renal/GU negative Renal ROS     Musculoskeletal   Abdominal   Peds  Hematology negative hematology ROS (+)   Anesthesia Other Findings Chest pain, unspecified     Hyperlipidemia        Asthma     Seasonal allergies        Chronic constipation     Umbilical hernia        Diastasis recti     S/P endoscopy May 2011 severe erosive reflux esophagitis, noncritial schatzki's ring, +h.pylori    S/P colonoscopy May 2011 multiple tubular adenomas: next TCS May 2014 Helicobacter pylori gastritis   s/p treatment may 2011    Anxiety     GERD (gastroesophageal reflux disease)        Arthritis   neck, low back, R shoulder ( for which she rec'd cortisone injection) HTN (hypertension)   pt. seen by cardiac ( Dr. Andee Lineman in [place]) for cardiac workup in 2010, after she had BS of 900+, told all was WNL    DM (diabetes mellitus)   treated /w oral hypoglycemics, BS was high as 900 in 2010, [facility] & initially started on Insulin but is currently controlled on oral only             Reproductive/Obstetrics negative OB  ROS                           Anesthesia Physical Anesthesia Plan  ASA: III  Anesthesia Plan: General ETT   Post-op Pain Management:    Induction:   Airway Management Planned:   Additional Equipment:   Intra-op Plan:   Post-operative Plan:   Informed Consent: I have reviewed the patients History and Physical, chart, labs and discussed the procedure including the risks, benefits and alternatives for the proposed anesthesia with the patient or authorized representative who has indicated his/her understanding and acceptance.   Dental Advisory Given  Plan Discussed with: CRNA and Surgeon  Anesthesia Plan Comments:         Anesthesia Quick Evaluation

## 2013-05-04 NOTE — Op Note (Signed)
Patient Name:           Mary Robinson   Date of Surgery:        05/04/2013  Pre op Diagnosis:      Multiple ventral incisional hernias  Post op Diagnosis:    Multiple incarcerated ventral incisional hernias  Procedure:                 Laparoscopic repair of multiple incarcerated ventral incisional hernias with 20 cm. X 20 cm. VentraLite mesh  Surgeon:                     Angelia Mould. Derrell Lolling, M.D., FACS  Assistant:                      None  Operative Indications:   Mary Robinson is a 57 y.o. female. She is referred by Dr. Roetta Sessions in [place] for evaluation of abdominal wall hernia. Her primary care physician is Dr. Avon Gully.  Past surgical history is significant for a laparoscopic cholecystectomy in [place] by Dr. Marcell Anger. She also says that she's had a bilateral tubal ligation. She has noticed a bulge at her umbilicus for about 3 years and this was actually noted on CT scan in 2011. She says is getting bigger and is a little bit uncomfortable. She also has some back pain which is unrelated to her hernias. She also notices a bulge in her upper abdomen which is felt to be due to a diastases recti.  Recent CT scan shows a midline supraumbilical hernia 3.2 cm containing fat only. A second hernia at the umbilical  area which looks a little bigger, and  a third hernia in the midline below the umbilicus. These are all in tandem and close to one another.  Dr. Jena Gauss performed a colonoscopy on 04/15/2013. He found some polyps which were found to be benign villous adenomas and were removed. She has chronic constipation. She has a history of H. Pylori gastritis  She is brought to the operating room electively for repair of her incisional hernias.   Operative Findings:       There were 3 incisional hernias all relatively close to one another. All 3 of these had incarcerated fatty tissue and omentum which had to be carefully debrided and reduced. The small bowel and colon were never actually  very close to the dissection.After all the omentum was released the omentum and small bowel were carefully inspected and there was no bleeding and no evidence of  intestinal injury. The 3 hernias together were probably 8 cm x 7 cm in total dimension. I was able to repair these with an inlay mesh graft of the ventral emesh, 20 cm in diameter. This gave 5-7 cm overlap in all directions.  Procedure in Detail:          Following the induction of general endotracheal anesthesia, intravenous antibiotics were given. The abdomen was prepped and draped in a sterile fashion. Surgical time out was performed. A 5 mm trocar was placed in the subcostal region on the left side. Optical entry was uneventful. Pneumoperitoneum was created. A 5 mm trocar was placed the left lower quadrant and an 11 mm trocar placed in the left flank. Ultimately I placed two 5 mm trocars in the right flank.  Using blunt dissection, sharp dissection, and the harmonic scalpel I slowly debrided and dissected and reduced the omentum from the multiple hernia defects. This dissection was uneventful and  I could see all 3 defects as described above. Using a spinal needle I marked the boundaries of the hernia defects, measured the distances and found that if  I used a 20 cm diameter circular piece of mesh I could get 5-7 cm overlap in all directions. I brought a 20 cm diameter circular piece of the  Bard VentraLite mesh to the operative field. Using a marking pen I marked a template on the mesh and the abdominal wall and then placed 8 equidistant sutures of 0 Novafil, being careful to tie the knots on the rough side so that the smooth adhesion barrier was down toward the intestine. After all these sutures were placed and marked I then moistened the mesh and rolled it up in the abdominal cavity. I spread the mesh out again being careful to place the rough side up toward the abdominal wall. I made skin puncture wounds at the 8 equidistant sites that were  planned and  then using an Endoclose device I pulled the the Novafil sutures up through the abdominal wall, being careful to take about a 1 cm bites in each site . I lifted  the mesh up and it  deployed nicely and I was able to tie the sutures securely in all the locations. I lifted the mesh up and tied all 8 sutures and these looked good. I then used a combination of the SecureStrap device and the 5 mm Pro tacker to secure the mesh in a double crown technique. I inspected this several times and found no defects. The mesh appeared to be securely fixed. There was no bleeding. The pneumoperitoneum was released and the trocars were removed. The skin incision closed with subcuticular sutures of 4-0 Monocryl and Dermabond. Abdominal binder was placed.   the patient was awakened and taken to recovery in stable condition. EBL 10-20 cc at most. Counts correct. Complications none.     Angelia Mould. Derrell Lolling, M.D., FACS General and Minimally Invasive Surgery Breast and Colorectal Surgery  05/04/2013 2:25 PM

## 2013-05-05 LAB — HEMOGLOBIN A1C: Mean Plasma Glucose: 180 mg/dL — ABNORMAL HIGH (ref <117)

## 2013-05-05 LAB — GLUCOSE, CAPILLARY
Glucose-Capillary: 128 mg/dL — ABNORMAL HIGH (ref 70–99)
Glucose-Capillary: 148 mg/dL — ABNORMAL HIGH (ref 70–99)

## 2013-05-05 MED ORDER — POLYETHYLENE GLYCOL 3350 17 G PO PACK
17.0000 g | PACK | Freq: Two times a day (BID) | ORAL | Status: DC
  Administered 2013-05-05 – 2013-05-07 (×5): 17 g via ORAL
  Filled 2013-05-05 (×6): qty 1

## 2013-05-05 MED ORDER — WHITE PETROLATUM GEL
Status: AC
  Administered 2013-05-05: 0.2
  Filled 2013-05-05: qty 5

## 2013-05-05 NOTE — Progress Notes (Signed)
1 Day Post-Op  Subjective: Stable and alert. VSS Tolerated liquid diet without nausea. Voiding well. No difficulty breathing. Using inhaler. Her major complaint is pain and inability to get out of bed by herself, moving slowly. Fearful of going home.  Glucoses 122-184 range. On lantus, SSI and metformin  Objective: Vital signs in last 24 hours: Temp:  [97.2 F (36.2 C)-98 F (36.7 C)] 97.8 F (36.6 C) (09/03 0527) Pulse Rate:  [59-80] 80 (09/03 0527) Resp:  [15-20] 18 (09/03 0527) BP: (103-143)/(58-83) 105/77 mmHg (09/03 0527) SpO2:  [93 %-100 %] 99 % (09/03 0527) Weight:  [221 lb 12.5 oz (100.6 kg)] 221 lb 12.5 oz (100.6 kg) (09/02 1607) Last BM Date: 05/04/13  Intake/Output from previous day: 09/02 0701 - 09/03 0700 In: 2230 [P.O.:730; I.V.:1500] Out: 300 [Urine:300] Intake/Output this shift: Total I/O In: 240 [P.O.:240] Out: 300 [Urine:300]    Exam: General appearance: alert. Cooperative. Mental status normal. Mild distress from abdominal pain during exam. Resp: clear to auscultation bilaterally GI: morbidly obese. Soft. Tender everywhere. Wounds looked fine.  Lab Results:  Results for orders placed during the hospital encounter of 05/04/13 (from the past 24 hour(s))  GLUCOSE, CAPILLARY     Status: Abnormal   Collection Time    05/04/13 10:18 AM      Result Value Range   Glucose-Capillary 122 (*) 70 - 99 mg/dL  GLUCOSE, CAPILLARY     Status: Abnormal   Collection Time    05/04/13  2:48 PM      Result Value Range   Glucose-Capillary 167 (*) 70 - 99 mg/dL   Comment 1 Notify RN    GLUCOSE, CAPILLARY     Status: Abnormal   Collection Time    05/04/13  4:48 PM      Result Value Range   Glucose-Capillary 184 (*) 70 - 99 mg/dL   Comment 1 Notify RN    CBC     Status: Abnormal   Collection Time    05/04/13  7:31 PM      Result Value Range   WBC 8.1  4.0 - 10.5 K/uL   RBC 4.55  3.87 - 5.11 MIL/uL   Hemoglobin 11.1 (*) 12.0 - 15.0 g/dL   HCT 34.7 (*) 42.5 - 95.6  %   MCV 77.8 (*) 78.0 - 100.0 fL   MCH 24.4 (*) 26.0 - 34.0 pg   MCHC 31.4  30.0 - 36.0 g/dL   RDW 38.7 (*) 56.4 - 33.2 %   Platelets 285  150 - 400 K/uL  CREATININE, SERUM     Status: Abnormal   Collection Time    05/04/13  7:31 PM      Result Value Range   Creatinine, Ser 0.87  0.50 - 1.10 mg/dL   GFR calc non Af Amer 72 (*) >90 mL/min   GFR calc Af Amer 83 (*) >90 mL/min  HEMOGLOBIN A1C     Status: Abnormal   Collection Time    05/04/13  7:31 PM      Result Value Range   Hemoglobin A1C 7.9 (*) <5.7 %   Mean Plasma Glucose 180 (*) <117 mg/dL  GLUCOSE, CAPILLARY     Status: Abnormal   Collection Time    05/04/13 10:45 PM      Result Value Range   Glucose-Capillary 148 (*) 70 - 99 mg/dL     Studies/Results: @RISRSLT24 @  . amLODipine  10 mg Oral Q breakfast  . aspirin EC  81 mg Oral Daily  .  atenolol  50 mg Oral Daily   And  . chlorthalidone  25 mg Oral Daily  .  ceFAZolin (ANCEF) IV  1 g Intravenous Q8H  . enoxaparin (LOVENOX) injection  40 mg Subcutaneous Q24H  . insulin aspart  0-20 Units Subcutaneous TID WC  . insulin glargine  10 Units Subcutaneous QHS  . linagliptin  5 mg Oral Daily  . loratadine  10 mg Oral Daily  . metFORMIN  850 mg Oral BID WC  . mometasone-formoterol  2 puff Inhalation BID  . pantoprazole  40 mg Oral Daily  . simvastatin  40 mg Oral QHS  . white petrolatum         Assessment/Plan: s/p Procedure(s): LAPAROSCOPIC INCISIONAL HERNIA  INSERTION OF MESH  POD #1.  Laparoscopic LOA, laparoscopic repair multiple ventral hernias with mesh. Stable. Will need another day in hospital to gain mobility and pain control. Push ambulation Twice a day MiraLAX to counteract narcotic-induced constipation. Advance diet  Non-insulin-dependent diabetes mellitus. Stable on current regimen Hypertension. Normotensive on current regimen. Asthma. Stable without evidence of rhonchal spasm. Continue inhalers History of H. Pylori gastritis. Continue  Protonix. Obesity. BMI 37.  @PROBHOSP @  LOS: 1 day    Kayra Crowell M. Derrell Lolling, M.D., [facility] General and Minimally invasive Surgery Breast and Colorectal Surgery Office:   [phone] Pager:   [phone]  05/05/2013  . .prob

## 2013-05-05 NOTE — Progress Notes (Signed)
Pt. was very uncomfortable earlier initial assessment stated she voids at frequent intervals, bladder was scanned showed  626 ml. of urine, explained to pt. about foley insertion. Pt. wants it done so she can have rest during the night and ease the bladder discomfort. Foley inserted no. 16 and pt. well tolerated. It drained of urine. Pt. Felt relieved from the pressure and stated that she is very comfortable at present. Dr. Gwinda Passe was notified about the pt. urinary retention after void with orders made.

## 2013-05-05 NOTE — Progress Notes (Signed)
Pt voiding frequent small amts but not emptying. Bladder scanned for 450 cc. I & O cath total was 700 cc. Will continue to monitor output.Irena Reichmann 05/05/2013

## 2013-05-06 ENCOUNTER — Encounter (HOSPITAL_COMMUNITY): Payer: Self-pay | Admitting: General Surgery

## 2013-05-06 LAB — CBC
HCT: 33.1 % — ABNORMAL LOW (ref 36.0–46.0)
Hemoglobin: 10.7 g/dL — ABNORMAL LOW (ref 12.0–15.0)
WBC: 7.6 10*3/uL (ref 4.0–10.5)

## 2013-05-06 LAB — URINALYSIS, ROUTINE W REFLEX MICROSCOPIC
Glucose, UA: NEGATIVE mg/dL
Specific Gravity, Urine: 1.015 (ref 1.005–1.030)
Urobilinogen, UA: 0.2 mg/dL (ref 0.0–1.0)

## 2013-05-06 LAB — BASIC METABOLIC PANEL
BUN: 9 mg/dL (ref 6–23)
Chloride: 99 mEq/L (ref 96–112)
GFR calc Af Amer: 72 mL/min — ABNORMAL LOW (ref >90)
Glucose, Bld: 130 mg/dL — ABNORMAL HIGH (ref 70–99)
Potassium: 3.1 mEq/L — ABNORMAL LOW (ref 3.5–5.1)

## 2013-05-06 LAB — GLUCOSE, CAPILLARY
Glucose-Capillary: 123 mg/dL — ABNORMAL HIGH (ref 70–99)
Glucose-Capillary: 110 mg/dL — ABNORMAL HIGH (ref 70–99)
Glucose-Capillary: 119 mg/dL — ABNORMAL HIGH (ref 70–99)

## 2013-05-06 MED ORDER — POLYETHYLENE GLYCOL 3350 17 G PO PACK: 17.0000 g | PACK | Freq: Two times a day (BID) | ORAL | Status: DC

## 2013-05-06 MED ORDER — HYDROMORPHONE HCL PF 1 MG/ML IJ SOLN: 0.5000 mg | INTRAMUSCULAR | Status: DC | PRN

## 2013-05-06 NOTE — Progress Notes (Signed)
2 Days Post-Op  Subjective: Feeling much better, but had problems with urinary retention, high PVRs, and an indwelling Foley catheter was inserted at 11 PM last night.   She feels much better now. She is tolerating diet much better. Ambulated independently, but has not had a bowel movement despite 2 doses of MiraLAX. Pain seems to be under better control. She wants to go home but doesn't think she can take care of the Foley catheter.  Having trouble leaning over to wipe herself after voiding.  Urinalysis basically clear.  Glucoses 123 10/03/1946 range. Of Lantus, SSI, metformin.  Objective: Vital signs in last 24 hours: Temp:  [98.5 F (36.9 C)-100 F (37.8 C)] 99.5 F (37.5 C) (09/04 0538) Pulse Rate:  [60-93] 60 (09/04 0538) Resp:  [16-20] 19 (09/04 0538) BP: (99-124)/(65-77) 99/65 mmHg (09/04 0538) SpO2:  [93 %-100 %] 93 % (09/04 0538) Last BM Date: 05/04/13  Intake/Output from previous day: 09/03 0701 - 09/04 0700 In: 2220.8 [P.O.:960; I.V.:1260.8] Out: 3775 [Urine:3775] Intake/Output this shift: Total I/O In: 1057.5 [P.O.:240; I.V.:817.5] Out: 1650 [Urine:1650]  Exam: General appearance: alert. Pleasant. Cooperative. No apparent distress. Mental status normal. Resp: clear to auscultation bilaterally GI: abdomen is obese, somewhat distended and tympanitic, wounds look good. Bowel sounds present. Reasonably soft.  Lab Results:  Results for orders placed during the hospital encounter of 05/04/13 (from the past 24 hour(s))  GLUCOSE, CAPILLARY     Status: Abnormal   Collection Time    05/05/13  7:57 AM      Result Value Range   Glucose-Capillary 128 (*) 70 - 99 mg/dL   Comment 1 Notify RN    GLUCOSE, CAPILLARY     Status: Abnormal   Collection Time    05/05/13 11:59 AM      Result Value Range   Glucose-Capillary 146 (*) 70 - 99 mg/dL   Comment 1 Notify RN    GLUCOSE, CAPILLARY     Status: Abnormal   Collection Time    05/05/13  5:34 PM      Result Value Range   Glucose-Capillary 148 (*) 70 - 99 mg/dL   Comment 1 Notify RN    GLUCOSE, CAPILLARY     Status: Abnormal   Collection Time    05/05/13 10:32 PM      Result Value Range   Glucose-Capillary 123 (*) 70 - 99 mg/dL  URINALYSIS, ROUTINE W REFLEX MICROSCOPIC     Status: Abnormal   Collection Time    05/06/13  2:30 AM      Result Value Range   Color, Urine YELLOW  YELLOW   APPearance CLEAR  CLEAR   Specific Gravity, Urine 1.015  1.005 - 1.030   pH 5.5  5.0 - 8.0   Glucose, UA NEGATIVE  NEGATIVE mg/dL   Hgb urine dipstick TRACE (*) NEGATIVE   Bilirubin Urine NEGATIVE  NEGATIVE   Ketones, ur NEGATIVE  NEGATIVE mg/dL   Protein, ur NEGATIVE  NEGATIVE mg/dL   Urobilinogen, UA 0.2  0.0 - 1.0 mg/dL   Nitrite NEGATIVE  NEGATIVE   Leukocytes, UA TRACE (*) NEGATIVE  URINE MICROSCOPIC-ADD ON     Status: None   Collection Time    05/06/13  2:30 AM      Result Value Range   Squamous Epithelial / LPF RARE  RARE   WBC, UA 0-2  <3 WBC/hpf   RBC / HPF 3-6  <3 RBC/hpf   Bacteria, UA RARE  RARE     Studies/Results: @RISRSLT24 @  .  amLODipine  10 mg Oral Q breakfast  . aspirin EC  81 mg Oral Daily  . atenolol  50 mg Oral Daily   And  . chlorthalidone  25 mg Oral Daily  . enoxaparin (LOVENOX) injection  40 mg Subcutaneous Q24H  . insulin aspart  0-20 Units Subcutaneous TID WC  . insulin glargine  10 Units Subcutaneous QHS  . linagliptin  5 mg Oral Daily  . loratadine  10 mg Oral Daily  . metFORMIN  850 mg Oral BID WC  . mometasone-formoterol  2 puff Inhalation BID  . pantoprazole  40 mg Oral Daily  . polyethylene glycol  17 g Oral BID  . polyethylene glycol  17 g Oral BID  . simvastatin  40 mg Oral QHS     Assessment/Plan: s/p Procedure(s): LAPAROSCOPIC INCISIONAL HERNIA  INSERTION OF MESH  POD #2. Laparoscopic LOA, laparoscopic repair multiple ventral hernias with mesh. Stable. She lives alone. She states that her sister is going to come and stay with her..  Considering her urinary  retention, indwelling Foley catheter, and abdominal distention I think it would be best to keep her in the hospital one more day. Push ambulation  Twice a day MiraLAX to counteract narcotic-induced constipation.  Advance diet   Urinary retention. Expect this will be self-limited. Decreased narcotic. Remove Foley catheter 4 AM tomorrow for a voiding trial. Hopefully home tomorrow.  Non-insulin-dependent diabetes mellitus. Stable on current regimen   Hypertension. Normotensive on current regimen.  Asthma. Stable without evidence of rhonchal spasm. Continue inhalers  History of H. Pylori gastritis. Continue Protonix.  Obesity. BMI 37.   @PROBHOSP @  LOS: 2 days    Robin Pafford M. Derrell Lolling, M.D., [facility] General and Minimally invasive Surgery Breast and Colorectal Surgery Office:   [phone] Pager:   [phone]  05/06/2013  . .prob

## 2013-05-07 LAB — GLUCOSE, CAPILLARY: Glucose-Capillary: 113 mg/dL — ABNORMAL HIGH (ref 70–99)

## 2013-05-07 LAB — URINE CULTURE: Colony Count: NO GROWTH

## 2013-05-07 MED ORDER — HYDROCODONE-ACETAMINOPHEN 5-325 MG PO TABS: 1.0000 | ORAL_TABLET | ORAL | Status: DC | PRN

## 2013-05-07 NOTE — Discharge Summary (Addendum)
Patient ID: Mary Robinson [id number] 57 y.o. [birth year]  Admit date: 05/04/2013  Discharge date and time: 05/07/2013  Admitting Physician: Ernestene Mention  Discharge Physician: Ernestene Mention  Admission Diagnoses: incisional hernia  Discharge Diagnoses: Multiple incarcerated incisional hernias  Operations: Procedure(s): LAPAROSCOPIC INCISIONAL HERNIA  INSERTION OF MESH  Admission Condition: good  Discharged Condition: good  Indication for Admission: Mary Robinson is a 57 y.o. female. She was referred by Dr. Roetta Sessions in [place] for evaluation of abdominal wall hernia. Her primary care physician is Dr. Avon Gully.  Past surgical history is significant for a laparoscopic cholecystectomy in [place] by Dr. Marcell Anger. She also says that she's had a bilateral tubal ligation. She has noticed a bulge at her umbilicus for about 3 years and this was actually noted on CT scan in 2011. She says is getting bigger and is a little bit uncomfortable. She also has some back pain which is unrelated to her hernias. She also notices a bulge in her upper abdomen which is felt to be due to a diastases recti.  Recent CT scan shows a midline supraumbilical hernia 3.2 cm containing fat only. A second hernia at the umbilical area which looks a little bigger, and a third hernia in the midline below the umbilicus. These are all in tandem and close to one another.   She is brought to the operating room electively for repair of her incisional hernias.   Hospital Course: On the day of admission the patient was brought to the operating room and underwent laparoscopic lysis of adhesions, reduction of incarcerated omentum from her largest incisional hernia. I found 3 incisional hernias relatively close to one another. Laparoscopic repair with a 20 cm diameter piece of mesh was performed uneventfully. Postoperatively the patient was having too much pain to go home on postop day #1. She was feeling much  better on postop day #2 but was in urinary retention had to have a Foley catheter placed. On postop day 3 she was feeling much better tolerating diet ambulating in the hall and was asking to go home. Her abdomen was obese, soft. Wounds looked good, although slightly distended. Catheter was removed and she was allowed to be discharged home. She was instructed in diet and activities and bladder care.Marland Kitchen She was instructed to take a laxative once or twice a day to get her bowels are moving. She was given a prescription for Vicodin, 35 tablets. She was asked to return to see me in the office in 3 weeks.  Consults: None  Significant Diagnostic Studies: none  Treatments: surgery: Laparoscopic repair of multiple incarcerated incisional hernias.  Disposition: Home  Patient Instructions:    Medication List         acetaminophen 650 MG CR tablet  Commonly known as:  TYLENOL  Take 650 mg by mouth daily as needed for pain.     ADVAIR DISKUS 100-50 MCG/DOSE Aepb  Generic drug:  Fluticasone-Salmeterol  Inhale 1 puff into the lungs 2 (two) times daily.     amLODipine 10 MG tablet  Commonly known as:  NORVASC  Take 10 mg by mouth daily with breakfast.     aspirin 81 MG tablet  Take 81 mg by mouth daily.     atenolol-chlorthalidone 50-25 MG per tablet  Commonly known as:  TENORETIC  Take 1 tablet by mouth daily with breakfast.     HYDROcodone-acetaminophen 5-325 MG per tablet  Commonly known as:  NORCO/VICODIN  Take 1-2 tablets by mouth every 4 (  four) hours as needed for pain.     loratadine 10 MG tablet  Commonly known as:  CLARITIN  Take 10 mg by mouth daily.     metFORMIN 850 MG tablet  Commonly known as:  GLUCOPHAGE  Take 850 mg by mouth 2 (two) times daily with a meal.     omeprazole 20 MG capsule  Commonly known as:  PRILOSEC  Take 20 mg by mouth daily with breakfast.     ONGLYZA 5 MG Tabs tablet  Generic drug:  saxagliptin HCl  Take 5 mg by mouth daily with breakfast.      polyethylene glycol packet  Commonly known as:  MIRALAX / GLYCOLAX  Take 17 g by mouth daily as needed (for constipation).     simvastatin 40 MG tablet  Commonly known as:  ZOCOR  Take 40 mg by mouth at bedtime.        Activity: ambulate in house and ambulate as much as possible. May climb stairs. No lifting for 5 weeks. Okay to drive in one week. Diet: low fat, low cholesterol diet Wound Care: none needed  Follow-up:  With Dr. Derrell Lolling in 3 weeks.  Signed: Angelia Mould. Derrell Lolling, M.D., FACS General and minimally invasive surgery Breast and Colorectal Surgery  05/07/2013, 5:47 AM

## 2013-05-24 ENCOUNTER — Ambulatory Visit (INDEPENDENT_AMBULATORY_CARE_PROVIDER_SITE_OTHER): Payer: Medicare Other | Admitting: General Surgery

## 2013-05-24 ENCOUNTER — Encounter (INDEPENDENT_AMBULATORY_CARE_PROVIDER_SITE_OTHER): Payer: Self-pay | Admitting: General Surgery

## 2013-05-24 ENCOUNTER — Telehealth (INDEPENDENT_AMBULATORY_CARE_PROVIDER_SITE_OTHER): Payer: Self-pay

## 2013-05-24 VITALS — BP 128/76 | HR 72 | Temp 97.6°F | Resp 16 | Ht 64.0 in | Wt 208.4 lb

## 2013-05-24 DIAGNOSIS — K469 Unspecified abdominal hernia without obstruction or gangrene: Secondary | ICD-10-CM

## 2013-05-24 MED ORDER — HYDROCODONE-ACETAMINOPHEN 5-325 MG PO TABS: 1.0000 | ORAL_TABLET | Freq: Four times a day (QID) | ORAL | Status: DC | PRN

## 2013-05-24 NOTE — Patient Instructions (Signed)
You are recovering from your laparoscopic ventral hernia repair with mesh without any obvious complications.  Try to walk as much as possible.  No sports or heavy lifting for 2 more weeks, and after that time there are no restrictions.  I strongly urged that you wean yourself off of the narcotics.  Return to see Dr. Derrell Lolling in 3 months

## 2013-05-24 NOTE — Progress Notes (Signed)
Patient ID: Mary Robinson, female   DOB: [birth year], 57 y.o.   MRN: [id number] History: This patient underwent laparoscopic repair of multiple ventral hernias in the midline on 05/04/2013. She says she is doing well. The tolerating diet. Normal bowel movements and bladder function. Minimal pain. She requested a refill on her hydrocodone.  Exam: Patient looks well. No distress. Good spirits Abdomen: Obese. Soft. Nontender. Trocar sites well-healed. Minimal seroma at umbilicus. Hernia repair intact  Assessment: Status post laparoscopic repair of multiple ventral hernias with mesh. Uneventful recovery Obesity  Plan: diet and activities discussed. Resume normal activities in 2 weeks Prescription for Vicodin, 20 tablets, no refill given to patient. Urged that she wean herself off of the narcotic Return to see me in 3 months.   Angelia Mould. Derrell Lolling, M.D., [facility] General and Minimally invasive Surgery Breast and Colorectal Surgery Office:   [phone] Pager:   [phone]

## 2013-05-24 NOTE — Telephone Encounter (Signed)
I called in a refill per Dr Derrell Lolling to [facility] on the Hydrocodone 5/325 1 po q 6hrs prn #20 with no refill.  Pt aware.

## 2013-05-24 NOTE — Addendum Note (Signed)
Addended byIvory Broad on: 05/24/2013 09:38 AM   Modules accepted: Orders

## 2013-07-06 ENCOUNTER — Encounter (INDEPENDENT_AMBULATORY_CARE_PROVIDER_SITE_OTHER): Payer: Self-pay

## 2013-09-16 ENCOUNTER — Ambulatory Visit (INDEPENDENT_AMBULATORY_CARE_PROVIDER_SITE_OTHER): Payer: Medicare Other | Admitting: General Surgery

## 2013-10-05 ENCOUNTER — Ambulatory Visit (HOSPITAL_COMMUNITY)
Admission: RE | Admit: 2013-10-05 | Discharge: 2013-10-05 | Disposition: A | Discharge status: Home / Self Care | Payer: Medicare Other | Source: Ambulatory Visit | Attending: Internal Medicine | Admitting: Internal Medicine

## 2013-10-05 ENCOUNTER — Other Ambulatory Visit (HOSPITAL_COMMUNITY): Payer: Self-pay | Admitting: Internal Medicine

## 2013-10-05 DIAGNOSIS — M25512 Pain in left shoulder: Secondary | ICD-10-CM

## 2013-10-05 DIAGNOSIS — M25519 Pain in unspecified shoulder: Secondary | ICD-10-CM | POA: Insufficient documentation

## 2014-06-29 ENCOUNTER — Other Ambulatory Visit (HOSPITAL_COMMUNITY): Payer: Self-pay | Admitting: Internal Medicine

## 2014-06-29 DIAGNOSIS — Z1231 Encounter for screening mammogram for malignant neoplasm of breast: Secondary | ICD-10-CM

## 2014-07-04 ENCOUNTER — Ambulatory Visit (HOSPITAL_COMMUNITY)
Admission: RE | Admit: 2014-07-04 | Discharge: 2014-07-04 | Disposition: A | Discharge status: Home / Self Care | Payer: Medicare Other | Source: Ambulatory Visit | Attending: Internal Medicine | Admitting: Internal Medicine

## 2014-07-04 DIAGNOSIS — Z1231 Encounter for screening mammogram for malignant neoplasm of breast: Secondary | ICD-10-CM | POA: Insufficient documentation

## 2014-07-12 ENCOUNTER — Other Ambulatory Visit (HOSPITAL_COMMUNITY)
Admission: RE | Admit: 2014-07-12 | Discharge: 2014-07-12 | Disposition: A | Discharge status: Home / Self Care | Payer: Medicare Other | Source: Ambulatory Visit | Attending: Adult Health | Admitting: Adult Health

## 2014-07-12 ENCOUNTER — Ambulatory Visit (INDEPENDENT_AMBULATORY_CARE_PROVIDER_SITE_OTHER): Payer: Medicare Other | Admitting: Adult Health

## 2014-07-12 ENCOUNTER — Encounter: Payer: Self-pay | Admitting: Adult Health

## 2014-07-12 VITALS — BP 140/90 | HR 74 | Ht 63.25 in | Wt 212.0 lb

## 2014-07-12 DIAGNOSIS — Z01419 Encounter for gynecological examination (general) (routine) without abnormal findings: Secondary | ICD-10-CM

## 2014-07-12 DIAGNOSIS — Z1151 Encounter for screening for human papillomavirus (HPV): Secondary | ICD-10-CM | POA: Insufficient documentation

## 2014-07-12 DIAGNOSIS — Z124 Encounter for screening for malignant neoplasm of cervix: Secondary | ICD-10-CM

## 2014-07-12 NOTE — Patient Instructions (Signed)
Pap in 3 years if this one negative Mammogram yearly  Labs with Dr Legrand Rams colonoscopy per GI

## 2014-07-12 NOTE — Progress Notes (Signed)
Patient ID: Mary Robinson, female   DOB: Apr 19, 1956, 58 y.o.   MRN: [id number] History of Present Illness: Mary Robinson is a 58 year old black female,new to this practice, in for gyn exam and pap.She was referred by Dr. Legrand Rams, she got flu shot about 1-2 weeks ago at his office.   Current Medications, Allergies, Past Medical History, Past Surgical History, Family History and Social History were reviewed in [employer] record.     Review of Systems: Patient denies any headaches, blurred vision, shortness of breath, chest pain, abdominal pain, problems with bowel movements, urination, or intercourse. Not having sex, has some diarrhea since GB out,no joint swelling or mood swings.    Physical Exam:BP 140/90 mmHg  Pulse 74  Ht 5' 3.25" (1.607 m)  Wt 212 lb (96.163 kg)  BMI 37.24 kg/m2 General:  Well developed, well nourished, no acute distress Skin:  Warm and dry Neck:  Midline trachea, normal thyroid, no carotid bruits heard Lungs; Clear to auscultation bilaterally Breast:  No dominant palpable mass, retraction, or nipple discharge, they are large and she had normal mammogram about 1 week ago Cardiovascular: Regular rate and rhythm Abdomen:  Soft, non tender, no hepatosplenomegaly,obese,well healed incisions Pelvic:  External genitalia is normal in appearance for age without lesions.  The vagina has decreased color, moisture and rugae. The cervix is atrophic,pap with HPV performed.  Uterus is felt to be normal size, shape, and contour,difficult secondary to abdominal girth.  No adnexal masses or tenderness noted. Rectal: Good sphincter tone, no polyps, or hemorrhoids felt.  Hemoccult negative. Extremities:  No swelling, has bilateral varicose veins R>L noted Psych:  No mood changes,alert and cooperative,seems happy   Impression: Well woman gyn exam with pap    Plan: Pap in 3 years if this one negative Mammogram yearly Colonoscopy per GI Labs with Dr  Legrand Rams

## 2014-07-14 LAB — CYTOLOGY - PAP

## 2014-09-06 DIAGNOSIS — E1165 Type 2 diabetes mellitus with hyperglycemia: Secondary | ICD-10-CM | POA: Diagnosis not present

## 2014-09-06 DIAGNOSIS — I10 Essential (primary) hypertension: Secondary | ICD-10-CM | POA: Diagnosis not present

## 2014-09-06 DIAGNOSIS — E785 Hyperlipidemia, unspecified: Secondary | ICD-10-CM | POA: Diagnosis not present

## 2014-09-06 DIAGNOSIS — E669 Obesity, unspecified: Secondary | ICD-10-CM | POA: Diagnosis not present

## 2014-09-27 ENCOUNTER — Other Ambulatory Visit: Payer: Self-pay

## 2014-09-28 MED ORDER — POLYETHYLENE GLYCOL 3350 17 G PO PACK: 17.0000 g | PACK | Freq: Every day | ORAL | Status: AC | PRN

## 2014-10-25 DIAGNOSIS — Z9851 Tubal ligation status: Secondary | ICD-10-CM | POA: Diagnosis not present

## 2014-10-25 DIAGNOSIS — R112 Nausea with vomiting, unspecified: Secondary | ICD-10-CM | POA: Diagnosis not present

## 2014-10-25 DIAGNOSIS — J45909 Unspecified asthma, uncomplicated: Secondary | ICD-10-CM | POA: Diagnosis not present

## 2014-10-25 DIAGNOSIS — E119 Type 2 diabetes mellitus without complications: Secondary | ICD-10-CM | POA: Diagnosis not present

## 2014-10-25 DIAGNOSIS — E876 Hypokalemia: Secondary | ICD-10-CM | POA: Diagnosis not present

## 2014-10-25 DIAGNOSIS — Z79899 Other long term (current) drug therapy: Secondary | ICD-10-CM | POA: Diagnosis not present

## 2014-10-25 DIAGNOSIS — K219 Gastro-esophageal reflux disease without esophagitis: Secondary | ICD-10-CM | POA: Diagnosis not present

## 2014-10-25 DIAGNOSIS — M1990 Unspecified osteoarthritis, unspecified site: Secondary | ICD-10-CM | POA: Diagnosis not present

## 2014-10-25 DIAGNOSIS — Z8249 Family history of ischemic heart disease and other diseases of the circulatory system: Secondary | ICD-10-CM | POA: Diagnosis not present

## 2014-10-25 DIAGNOSIS — Z9049 Acquired absence of other specified parts of digestive tract: Secondary | ICD-10-CM | POA: Diagnosis not present

## 2014-10-25 DIAGNOSIS — R197 Diarrhea, unspecified: Secondary | ICD-10-CM | POA: Diagnosis not present

## 2014-10-25 DIAGNOSIS — E780 Pure hypercholesterolemia: Secondary | ICD-10-CM | POA: Diagnosis not present

## 2014-10-25 DIAGNOSIS — I10 Essential (primary) hypertension: Secondary | ICD-10-CM | POA: Diagnosis not present

## 2014-10-26 DIAGNOSIS — I10 Essential (primary) hypertension: Secondary | ICD-10-CM | POA: Diagnosis not present

## 2014-10-26 DIAGNOSIS — E876 Hypokalemia: Secondary | ICD-10-CM | POA: Diagnosis not present

## 2014-10-26 DIAGNOSIS — K529 Noninfective gastroenteritis and colitis, unspecified: Secondary | ICD-10-CM | POA: Diagnosis not present

## 2014-10-26 DIAGNOSIS — E1165 Type 2 diabetes mellitus with hyperglycemia: Secondary | ICD-10-CM | POA: Diagnosis not present

## 2014-12-20 DIAGNOSIS — J45909 Unspecified asthma, uncomplicated: Secondary | ICD-10-CM | POA: Diagnosis not present

## 2014-12-20 DIAGNOSIS — E1165 Type 2 diabetes mellitus with hyperglycemia: Secondary | ICD-10-CM | POA: Diagnosis not present

## 2014-12-20 DIAGNOSIS — I10 Essential (primary) hypertension: Secondary | ICD-10-CM | POA: Diagnosis not present

## 2014-12-20 DIAGNOSIS — E785 Hyperlipidemia, unspecified: Secondary | ICD-10-CM | POA: Diagnosis not present

## 2015-03-14 DIAGNOSIS — E118 Type 2 diabetes mellitus with unspecified complications: Secondary | ICD-10-CM | POA: Diagnosis not present

## 2015-03-14 DIAGNOSIS — E11620 Type 2 diabetes mellitus with diabetic dermatitis: Secondary | ICD-10-CM | POA: Diagnosis not present

## 2015-03-14 DIAGNOSIS — Z0001 Encounter for general adult medical examination with abnormal findings: Secondary | ICD-10-CM | POA: Diagnosis not present

## 2015-03-14 DIAGNOSIS — E876 Hypokalemia: Secondary | ICD-10-CM | POA: Diagnosis not present

## 2015-03-14 DIAGNOSIS — I10 Essential (primary) hypertension: Secondary | ICD-10-CM | POA: Diagnosis not present

## 2015-03-14 DIAGNOSIS — E1165 Type 2 diabetes mellitus with hyperglycemia: Secondary | ICD-10-CM | POA: Diagnosis not present

## 2015-06-14 DIAGNOSIS — I10 Essential (primary) hypertension: Secondary | ICD-10-CM | POA: Diagnosis not present

## 2015-06-14 DIAGNOSIS — J45909 Unspecified asthma, uncomplicated: Secondary | ICD-10-CM | POA: Diagnosis not present

## 2015-06-14 DIAGNOSIS — E669 Obesity, unspecified: Secondary | ICD-10-CM | POA: Diagnosis not present

## 2015-06-14 DIAGNOSIS — E1165 Type 2 diabetes mellitus with hyperglycemia: Secondary | ICD-10-CM | POA: Diagnosis not present

## 2015-06-14 DIAGNOSIS — Z23 Encounter for immunization: Secondary | ICD-10-CM | POA: Diagnosis not present

## 2015-06-24 DIAGNOSIS — Z7982 Long term (current) use of aspirin: Secondary | ICD-10-CM | POA: Diagnosis not present

## 2015-06-24 DIAGNOSIS — Z8249 Family history of ischemic heart disease and other diseases of the circulatory system: Secondary | ICD-10-CM | POA: Diagnosis not present

## 2015-06-24 DIAGNOSIS — E7800 Pure hypercholesterolemia, unspecified: Secondary | ICD-10-CM | POA: Diagnosis not present

## 2015-06-24 DIAGNOSIS — Z7951 Long term (current) use of inhaled steroids: Secondary | ICD-10-CM | POA: Diagnosis not present

## 2015-06-24 DIAGNOSIS — R030 Elevated blood-pressure reading, without diagnosis of hypertension: Secondary | ICD-10-CM | POA: Diagnosis not present

## 2015-06-24 DIAGNOSIS — Z79899 Other long term (current) drug therapy: Secondary | ICD-10-CM | POA: Diagnosis not present

## 2015-06-24 DIAGNOSIS — R079 Chest pain, unspecified: Secondary | ICD-10-CM | POA: Diagnosis not present

## 2015-06-24 DIAGNOSIS — F329 Major depressive disorder, single episode, unspecified: Secondary | ICD-10-CM | POA: Diagnosis not present

## 2015-06-24 DIAGNOSIS — E119 Type 2 diabetes mellitus without complications: Secondary | ICD-10-CM | POA: Diagnosis not present

## 2015-06-24 DIAGNOSIS — R112 Nausea with vomiting, unspecified: Secondary | ICD-10-CM | POA: Diagnosis not present

## 2015-06-24 DIAGNOSIS — J45909 Unspecified asthma, uncomplicated: Secondary | ICD-10-CM | POA: Diagnosis not present

## 2015-06-24 DIAGNOSIS — K529 Noninfective gastroenteritis and colitis, unspecified: Secondary | ICD-10-CM | POA: Diagnosis not present

## 2015-06-24 DIAGNOSIS — R1110 Vomiting, unspecified: Secondary | ICD-10-CM | POA: Diagnosis not present

## 2015-06-25 DIAGNOSIS — R1110 Vomiting, unspecified: Secondary | ICD-10-CM | POA: Diagnosis not present

## 2015-06-27 DIAGNOSIS — E1165 Type 2 diabetes mellitus with hyperglycemia: Secondary | ICD-10-CM | POA: Diagnosis not present

## 2015-06-27 DIAGNOSIS — I10 Essential (primary) hypertension: Secondary | ICD-10-CM | POA: Diagnosis not present

## 2015-06-27 DIAGNOSIS — A084 Viral intestinal infection, unspecified: Secondary | ICD-10-CM | POA: Diagnosis not present

## 2015-07-20 DIAGNOSIS — H52223 Regular astigmatism, bilateral: Secondary | ICD-10-CM | POA: Diagnosis not present

## 2015-07-20 DIAGNOSIS — H1045 Other chronic allergic conjunctivitis: Secondary | ICD-10-CM | POA: Diagnosis not present

## 2015-07-20 DIAGNOSIS — H5212 Myopia, left eye: Secondary | ICD-10-CM | POA: Diagnosis not present

## 2015-07-20 DIAGNOSIS — E113293 Type 2 diabetes mellitus with mild nonproliferative diabetic retinopathy without macular edema, bilateral: Secondary | ICD-10-CM | POA: Diagnosis not present

## 2015-09-21 ENCOUNTER — Other Ambulatory Visit (HOSPITAL_COMMUNITY): Payer: Self-pay | Admitting: Internal Medicine

## 2015-09-21 DIAGNOSIS — E785 Hyperlipidemia, unspecified: Secondary | ICD-10-CM | POA: Diagnosis not present

## 2015-09-21 DIAGNOSIS — E876 Hypokalemia: Secondary | ICD-10-CM | POA: Diagnosis not present

## 2015-09-21 DIAGNOSIS — E118 Type 2 diabetes mellitus with unspecified complications: Secondary | ICD-10-CM | POA: Diagnosis not present

## 2015-09-21 DIAGNOSIS — I10 Essential (primary) hypertension: Secondary | ICD-10-CM | POA: Diagnosis not present

## 2015-09-21 DIAGNOSIS — Z0000 Encounter for general adult medical examination without abnormal findings: Secondary | ICD-10-CM | POA: Diagnosis not present

## 2015-09-21 DIAGNOSIS — E1165 Type 2 diabetes mellitus with hyperglycemia: Secondary | ICD-10-CM | POA: Diagnosis not present

## 2015-09-21 DIAGNOSIS — Z1231 Encounter for screening mammogram for malignant neoplasm of breast: Secondary | ICD-10-CM

## 2015-09-27 ENCOUNTER — Ambulatory Visit (HOSPITAL_COMMUNITY)
Admission: RE | Admit: 2015-09-27 | Discharge: 2015-09-27 | Disposition: A | Discharge status: Home / Self Care | Payer: Medicare Other | Source: Ambulatory Visit | Attending: Internal Medicine | Admitting: Internal Medicine

## 2015-09-27 DIAGNOSIS — Z1231 Encounter for screening mammogram for malignant neoplasm of breast: Secondary | ICD-10-CM | POA: Insufficient documentation

## 2015-12-04 DIAGNOSIS — I10 Essential (primary) hypertension: Secondary | ICD-10-CM | POA: Diagnosis not present

## 2015-12-04 DIAGNOSIS — J4531 Mild persistent asthma with (acute) exacerbation: Secondary | ICD-10-CM | POA: Diagnosis not present

## 2015-12-04 DIAGNOSIS — E1165 Type 2 diabetes mellitus with hyperglycemia: Secondary | ICD-10-CM | POA: Diagnosis not present

## 2015-12-26 DIAGNOSIS — E785 Hyperlipidemia, unspecified: Secondary | ICD-10-CM | POA: Diagnosis not present

## 2015-12-26 DIAGNOSIS — I10 Essential (primary) hypertension: Secondary | ICD-10-CM | POA: Diagnosis not present

## 2015-12-26 DIAGNOSIS — E1165 Type 2 diabetes mellitus with hyperglycemia: Secondary | ICD-10-CM | POA: Diagnosis not present

## 2015-12-26 DIAGNOSIS — K219 Gastro-esophageal reflux disease without esophagitis: Secondary | ICD-10-CM | POA: Diagnosis not present

## 2016-01-19 ENCOUNTER — Emergency Department (HOSPITAL_COMMUNITY)
Admission: EM | Admit: 2016-01-19 | Discharge: 2016-01-19 | Disposition: A | Discharge status: Home / Self Care | Payer: Medicare Other | Attending: Emergency Medicine | Admitting: Emergency Medicine

## 2016-01-19 ENCOUNTER — Encounter (HOSPITAL_COMMUNITY): Payer: Self-pay | Admitting: Emergency Medicine

## 2016-01-19 DIAGNOSIS — J45909 Unspecified asthma, uncomplicated: Secondary | ICD-10-CM | POA: Insufficient documentation

## 2016-01-19 DIAGNOSIS — I10 Essential (primary) hypertension: Secondary | ICD-10-CM | POA: Diagnosis not present

## 2016-01-19 DIAGNOSIS — Z79899 Other long term (current) drug therapy: Secondary | ICD-10-CM | POA: Insufficient documentation

## 2016-01-19 DIAGNOSIS — Z7984 Long term (current) use of oral hypoglycemic drugs: Secondary | ICD-10-CM | POA: Diagnosis not present

## 2016-01-19 DIAGNOSIS — Y929 Unspecified place or not applicable: Secondary | ICD-10-CM | POA: Insufficient documentation

## 2016-01-19 DIAGNOSIS — Z7982 Long term (current) use of aspirin: Secondary | ICD-10-CM | POA: Diagnosis not present

## 2016-01-19 DIAGNOSIS — T148XXA Other injury of unspecified body region, initial encounter: Secondary | ICD-10-CM

## 2016-01-19 DIAGNOSIS — X58XXXA Exposure to other specified factors, initial encounter: Secondary | ICD-10-CM | POA: Insufficient documentation

## 2016-01-19 DIAGNOSIS — F329 Major depressive disorder, single episode, unspecified: Secondary | ICD-10-CM | POA: Diagnosis not present

## 2016-01-19 DIAGNOSIS — Y999 Unspecified external cause status: Secondary | ICD-10-CM | POA: Insufficient documentation

## 2016-01-19 DIAGNOSIS — E785 Hyperlipidemia, unspecified: Secondary | ICD-10-CM | POA: Insufficient documentation

## 2016-01-19 DIAGNOSIS — S7012XA Contusion of left thigh, initial encounter: Secondary | ICD-10-CM | POA: Diagnosis not present

## 2016-01-19 DIAGNOSIS — M1990 Unspecified osteoarthritis, unspecified site: Secondary | ICD-10-CM | POA: Diagnosis not present

## 2016-01-19 DIAGNOSIS — S7002XA Contusion of left hip, initial encounter: Secondary | ICD-10-CM | POA: Diagnosis not present

## 2016-01-19 DIAGNOSIS — E119 Type 2 diabetes mellitus without complications: Secondary | ICD-10-CM | POA: Diagnosis not present

## 2016-01-19 DIAGNOSIS — Y939 Activity, unspecified: Secondary | ICD-10-CM | POA: Insufficient documentation

## 2016-01-19 DIAGNOSIS — M79652 Pain in left thigh: Secondary | ICD-10-CM | POA: Diagnosis present

## 2016-01-19 LAB — CBC WITH DIFFERENTIAL/PLATELET
BASOS ABS: 0 10*3/uL (ref 0.0–0.1)
Basophils Relative: 0 %
Eosinophils Absolute: 0.3 10*3/uL (ref 0.0–0.7)
Eosinophils Relative: 3 %
HEMATOCRIT: 37.4 % (ref 36.0–46.0)
HEMOGLOBIN: 11.4 g/dL — AB (ref 12.0–15.0)
LYMPHS PCT: 35 %
Lymphs Abs: 2.9 10*3/uL (ref 0.7–4.0)
MCH: 23.4 pg — ABNORMAL LOW (ref 26.0–34.0)
MCHC: 30.5 g/dL (ref 30.0–36.0)
MCV: 76.6 fL — ABNORMAL LOW (ref 78.0–100.0)
Monocytes Absolute: 0.4 10*3/uL (ref 0.1–1.0)
Monocytes Relative: 5 %
NEUTROS ABS: 4.6 10*3/uL (ref 1.7–7.7)
NEUTROS PCT: 57 %
Platelets: 393 10*3/uL (ref 150–400)
RBC: 4.88 MIL/uL (ref 3.87–5.11)
RDW: 17.2 % — AB (ref 11.5–15.5)
WBC: 8.2 10*3/uL (ref 4.0–10.5)

## 2016-01-19 MED ORDER — LOSARTAN POTASSIUM 50 MG PO TABS
50.0000 mg | ORAL_TABLET | Freq: Once | ORAL | Status: AC
  Administered 2016-01-19: 50 mg via ORAL
  Filled 2016-01-19: qty 1

## 2016-01-19 MED ORDER — LOSARTAN POTASSIUM 50 MG PO TABS
ORAL_TABLET | ORAL | Status: AC
  Filled 2016-01-19: qty 1

## 2016-01-19 MED ORDER — LOSARTAN POTASSIUM 100 MG PO TABS: 100.0000 mg | ORAL_TABLET | Freq: Once | ORAL | Status: DC

## 2016-01-19 NOTE — ED Provider Notes (Signed)
CSN: [id number]     Arrival date & time 01/19/16  1842 History   First MD Initiated Contact with Patient 01/19/16 2011     Chief Complaint  Patient presents with  . Leg Pain   Patient is a 60 y.o. female presenting with leg pain.  Leg Pain  Pt presents to the ED for evaluation of left thigh bruising and pain.  Pt does not recall any injuries but noticed an area of tenderness on her left thigh today while at work.  The are is also sore to the touch.  She denies chest pain or shortness of breath.  No fevers or chills.  No bruising or bleeding elsewhere.  Pt also mentions that her BP has been high.  She saw her doctor and was told that she might end up needing a third medication.  She is currently taking losartan 50 mg and norvasc 10 mg.  She is not taking tenoretic.  Past Medical History  Diagnosis Date  . Chest pain, unspecified   . Hyperlipidemia   . Asthma   . Seasonal allergies   . Chronic constipation   . Umbilical hernia   . Diastasis recti   . S/P endoscopy May 2011    severe erosive reflux esophagitis, noncritial schatzki's ring, +h.pylori  . S/P colonoscopy May 2011    multiple tubular adenomas: next TCS May 2014  . Helicobacter pylori gastritis     s/p treatment may 2011  . Anxiety   . GERD (gastroesophageal reflux disease)   . HTN (hypertension)     pt. seen by cardiac ( Dr. Dannielle Burn in [place]) for cardiac workup in 2010, after she had BS of 900+, told all was WNL  . Sleep apnea     "small amount"; denies use of mask on 05/04/2013  . Type II diabetes mellitus ([place])     treated /w oral hypoglycemics, BS was high as 900 in 2010, treated at [facility] & initially started on Insulin :"that only lasted a few months"; is currently controlled on oral only/pt report 05/04/2013  . Anemia   . Arthritis     "knees; neck, low back, R shoulder ( for which she rec'd cortisone injection once" (05/04/2013)  . Chronic back pain     "all over; starts in my lower back" (05/04/2013)  . Depression    . Bursitis   . Obesity    Past Surgical History  Procedure Laterality Date  . Cholecystectomy  1999  . Dilation and curettage of uterus  1970's  . Tubal ligation  1970's  . Colonoscopy  May 2011    Dr. Gala Romney: long, torturous colon, multiple colonic polyps. TUBULAR ADENOMAS  . Esophagogastroduodenoscopy  May 2011    Dr. Gala Romney: severe erosive reflux esophagitis, non-critical Schatzki's ring, diffuse gastric erosions. H. PYLORI GASTRITIS  . Colonoscopy N/A 04/15/2013    Procedure: COLONOSCOPY;  Surgeon: Daneil Dolin, MD;  Location: [facility];  Service: Endoscopy;  Laterality: N/A;  11:30-moved to 12:15 Darius Bump to notify pt  . Hernia repair  05/04/2013    laparoscopic incisional hernia repair w/mesh per notes(05/04/2013)  . Incisional hernia repair N/A 05/04/2013    Procedure: LAPAROSCOPIC INCISIONAL HERNIA ;  Surgeon: Adin Hector, MD;  Location: [place];  Service: General;  Laterality: N/A;  . Insertion of mesh N/A 05/04/2013    Procedure: INSERTION OF MESH;  Surgeon: Adin Hector, MD;  Location: [place];  Service: General;  Laterality: N/A;   Family History  Problem Relation  Age of Onset  . Colon cancer Maternal Uncle   . Colon cancer Paternal Uncle   . COPD Mother   . Hypertension Mother   . Seizures Mother   . Kidney disease Mother     renal failure  . Heart disease Father   . Diabetes Sister   . Hypertension Sister   . Heart disease Sister   . Diabetes Brother   . Hypertension Brother   . Hypertension Daughter   . Hypertension Son   . Diabetes Sister   . Hypertension Sister   . Diabetes Sister   . Hypertension Sister    Social History  Substance Use Topics  . Smoking status: Never Smoker   . Smokeless tobacco: Never Used  . Alcohol Use: No   OB History    Gravida Para Term Preterm AB TAB SAB Ectopic Multiple Living   2 2        2      Review of Systems  All other systems reviewed and are negative.     Allergies  Review of patient's allergies  indicates no known allergies.  Home Medications   Prior to Admission medications   Medication Sig Start Date End Date Taking? Authorizing Provider  acetaminophen (TYLENOL) 650 MG CR tablet Take 650 mg by mouth daily as needed for pain.     Historical Provider, MD  amLODipine (NORVASC) 10 MG tablet Take 10 mg by mouth daily with breakfast.     Historical Provider, MD  aspirin 81 MG tablet Take 81 mg by mouth daily.      Historical Provider, MD  Fluticasone-Salmeterol (ADVAIR DISKUS) 100-50 MCG/DOSE AEPB Inhale 1 puff into the lungs 2 (two) times daily.     Historical Provider, MD  loratadine (CLARITIN) 10 MG tablet Take 10 mg by mouth daily.      Historical Provider, MD  losartan (COZAAR) 100 MG tablet Take 1 tablet (100 mg total) by mouth once. 01/19/16   Dorie Rank, MD  metFORMIN (GLUCOPHAGE) 850 MG tablet Take 850 mg by mouth 2 (two) times daily with a meal.      Historical Provider, MD  omeprazole (PRILOSEC) 20 MG capsule Take 20 mg by mouth daily with breakfast.  03/23/13   Historical Provider, MD  polyethylene glycol (MIRALAX / GLYCOLAX) packet Take 17 g by mouth daily as needed (for constipation). 09/28/14   Mahala Menghini, PA-C  saxagliptin HCl (ONGLYZA) 5 MG TABS tablet Take 5 mg by mouth daily with breakfast.     Historical Provider, MD  simvastatin (ZOCOR) 40 MG tablet Take 40 mg by mouth at bedtime.      Historical Provider, MD   BP 196/106 mmHg  Pulse 97  Temp(Src) 99.1 F (37.3 C) (Oral)  Resp 20  Ht 5\' 4"  (1.626 m)  Wt 83.008 kg  BMI 31.40 kg/m2  SpO2 99% Physical Exam  Constitutional: She appears well-developed and well-nourished. No distress.  HENT:  Head: Normocephalic and atraumatic.  Right Ear: External ear normal.  Left Ear: External ear normal.  Eyes: Conjunctivae are normal. Right eye exhibits no discharge. Left eye exhibits no discharge. No scleral icterus.  Neck: Neck supple. No tracheal deviation present.  Cardiovascular: Normal rate, regular rhythm and intact  distal pulses.   Pulmonary/Chest: Effort normal and breath sounds normal. No stridor. No respiratory distress. She has no wheezes. She has no rales.  Abdominal: Soft. Bowel sounds are normal. She exhibits no distension. There is no tenderness. There is no rebound and no guarding.  Musculoskeletal: She exhibits tenderness. She exhibits no edema.  Superficial are of bruising lateral mid aspect left thigh, ttp; no distal edema or erythema, no lymphangitic bruising  Neurological: She is alert. She has normal strength. No cranial nerve deficit (no facial droop, extraocular movements intact, no slurred speech) or sensory deficit. She exhibits normal muscle tone. She displays no seizure activity. Coordination normal.  Skin: Skin is warm and dry. No rash noted. She is not diaphoretic.  Psychiatric: She has a normal mood and affect.  Nursing note and vitals reviewed.   ED Course  Procedures  Labs Reviewed  CBC WITH DIFFERENTIAL/PLATELET - Abnormal; Notable for the following:    Hemoglobin 11.4 (*)    MCV 76.6 (*)    MCH 23.4 (*)    RDW 17.2 (*)    All other components within normal limits      MDM   Final diagnoses:  Hematoma  Essential hypertension    Suspect sx are related to a superficial bruise.  Pt does not recall any injury, so will check CBC to check her platelets.  Pt is hypertensive.  Will give her an additional dose of cozaar.  Can increase dose to 100 mg daily and have her follow up with her PCP  Platelets are normal.  Stable for dc    Dorie Rank, MD 01/19/16 2122

## 2016-01-19 NOTE — Discharge Instructions (Signed)
Hypertension Hypertension, commonly called high blood pressure, is when the force of blood pumping through your arteries is too strong. Your arteries are the blood vessels that carry blood from your heart throughout your body. A blood pressure reading consists of a higher number over a lower number, such as 110/72. The higher number (systolic) is the pressure inside your arteries when your heart pumps. The lower number (diastolic) is the pressure inside your arteries when your heart relaxes. Ideally you want your blood pressure below 120/80. Hypertension forces your heart to work harder to pump blood. Your arteries may become narrow or stiff. Having untreated or uncontrolled hypertension can cause heart attack, stroke, kidney disease, and other problems. RISK FACTORS Some risk factors for high blood pressure are controllable. Others are not.  Risk factors you cannot control include:   Race. You may be at higher risk if you are African American.  Age. Risk increases with age.  Gender. Men are at higher risk than women before age 45 years. After age 65, women are at higher risk than men. Risk factors you can control include:  Not getting enough exercise or physical activity.  Being overweight.  Getting too much fat, sugar, calories, or salt in your diet.  Drinking too much alcohol. SIGNS AND SYMPTOMS Hypertension does not usually cause signs or symptoms. Extremely high blood pressure (hypertensive crisis) may cause headache, anxiety, shortness of breath, and nosebleed. DIAGNOSIS To check if you have hypertension, your health care provider will measure your blood pressure while you are seated, with your arm held at the level of your heart. It should be measured at least twice using the same arm. Certain conditions can cause a difference in blood pressure between your right and left arms. A blood pressure reading that is higher than normal on one occasion does not mean that you need treatment. If  it is not clear whether you have high blood pressure, you may be asked to return on a different day to have your blood pressure checked again. Or, you may be asked to monitor your blood pressure at home for 1 or more weeks. TREATMENT Treating high blood pressure includes making lifestyle changes and possibly taking medicine. Living a healthy lifestyle can help lower high blood pressure. You may need to change some of your habits. Lifestyle changes may include:  Following the DASH diet. This diet is high in fruits, vegetables, and whole grains. It is low in salt, red meat, and added sugars.  Keep your sodium intake below 2,300 mg per day.  Getting at least 30-45 minutes of aerobic exercise at least 4 times per week.  Losing weight if necessary.  Not smoking.  Limiting alcoholic beverages.  Learning ways to reduce stress. Your health care provider may prescribe medicine if lifestyle changes are not enough to get your blood pressure under control, and if one of the following is true:  You are 18-59 years of age and your systolic blood pressure is above 140.  You are 60 years of age or older, and your systolic blood pressure is above 150.  Your diastolic blood pressure is above 90.  You have diabetes, and your systolic blood pressure is over 140 or your diastolic blood pressure is over 90.  You have kidney disease and your blood pressure is above 140/90.  You have heart disease and your blood pressure is above 140/90. Your personal target blood pressure may vary depending on your medical conditions, your age, and other factors. HOME CARE INSTRUCTIONS    Have your blood pressure rechecked as directed by your health care provider.   Take medicines only as directed by your health care provider. Follow the directions carefully. Blood pressure medicines must be taken as prescribed. The medicine does not work as well when you skip doses. Skipping doses also puts you at risk for  problems.  Do not smoke.   Monitor your blood pressure at home as directed by your health care provider. SEEK MEDICAL CARE IF:   You think you are having a reaction to medicines taken.  You have recurrent headaches or feel dizzy.  You have swelling in your ankles.  You have trouble with your vision. SEEK IMMEDIATE MEDICAL CARE IF:  You develop a severe headache or confusion.  You have unusual weakness, numbness, or feel faint.  You have severe chest or abdominal pain.  You vomit repeatedly.  You have trouble breathing. MAKE SURE YOU:   Understand these instructions.  Will watch your condition.  Will get help right away if you are not doing well or get worse.   This information is not intended to replace advice given to you by your health care provider. Make sure you discuss any questions you have with your health care provider.   Document Released: 08/19/2005 Document Revised: 01/03/2015 Document Reviewed: 06/11/2013 Elsevier Interactive Patient Education 2016 [employer] Hematoma A hematoma is a collection of blood under the skin, in an organ, in a body space, in a joint space, or in other tissue. The blood can clot to form a lump that you can see and feel. The lump is often firm and may sometimes become sore and tender. Most hematomas get better in a few days to weeks. However, some hematomas may be serious and require medical care. Hematomas can range in size from very small to very large. CAUSES  A hematoma can be caused by a blunt or penetrating injury. It can also be caused by spontaneous leakage from a blood vessel under the skin. Spontaneous leakage from a blood vessel is more likely to occur in older people, especially those taking blood thinners. Sometimes, a hematoma can develop after certain medical procedures. SIGNS AND SYMPTOMS   A firm lump on the body.  Possible pain and tenderness in the area.  Bruising.Blue, dark blue, purple-red, or yellowish  skin may appear at the site of the hematoma if the hematoma is close to the surface of the skin. For hematomas in deeper tissues or body spaces, the signs and symptoms may be subtle. For example, an intra-abdominal hematoma may cause abdominal pain, weakness, fainting, and shortness of breath. An intracranial hematoma may cause a headache or symptoms such as weakness, trouble speaking, or a change in consciousness. DIAGNOSIS  A hematoma can usually be diagnosed based on your medical history and a physical exam. Imaging tests may be needed if your health care provider suspects a hematoma in deeper tissues or body spaces, such as the abdomen, head, or chest. These tests may include ultrasonography or a CT scan.  TREATMENT  Hematomas usually go away on their own over time. Rarely does the blood need to be drained out of the body. Large hematomas or those that may affect vital organs will sometimes need surgical drainage or monitoring. HOME CARE INSTRUCTIONS   Apply ice to the injured area:   Put ice in a plastic bag.   Place a towel between your skin and the bag.   Leave the ice on for 20 minutes, 2-3 times a day  for the first 1 to 2 days.   After the first 2 days, switch to using warm compresses on the hematoma.   Elevate the injured area to help decrease pain and swelling. Wrapping the area with an elastic bandage may also be helpful. Compression helps to reduce swelling and promotes shrinking of the hematoma. Make sure the bandage is not wrapped too tight.   If your hematoma is on a lower extremity and is painful, crutches may be helpful for a couple days.   Only take over-the-counter or prescription medicines as directed by your health care provider. SEEK IMMEDIATE MEDICAL CARE IF:   You have increasing pain, or your pain is not controlled with medicine.   You have a fever.   You have worsening swelling or discoloration.   Your skin over the hematoma breaks or starts  bleeding.   Your hematoma is in your chest or abdomen and you have weakness, shortness of breath, or a change in consciousness.  Your hematoma is on your scalp (caused by a fall or injury) and you have a worsening headache or a change in alertness or consciousness. MAKE SURE YOU:   Understand these instructions.  Will watch your condition.  Will get help right away if you are not doing well or get worse.   This information is not intended to replace advice given to you by your health care provider. Make sure you discuss any questions you have with your health care provider.   Document Released: 04/02/2004 Document Revised: 04/21/2013 Document Reviewed: 01/27/2013 Elsevier Interactive Patient Education [employer].

## 2016-01-19 NOTE — ED Notes (Signed)
Patient complaining of upper left leg pain starting today. Patient has redness and swelling noted to upper left leg. Denies injury.

## 2016-03-26 DIAGNOSIS — E1165 Type 2 diabetes mellitus with hyperglycemia: Secondary | ICD-10-CM | POA: Diagnosis not present

## 2016-03-26 DIAGNOSIS — E118 Type 2 diabetes mellitus with unspecified complications: Secondary | ICD-10-CM | POA: Diagnosis not present

## 2016-03-26 DIAGNOSIS — E876 Hypokalemia: Secondary | ICD-10-CM | POA: Diagnosis not present

## 2016-03-26 DIAGNOSIS — K219 Gastro-esophageal reflux disease without esophagitis: Secondary | ICD-10-CM | POA: Diagnosis not present

## 2016-03-26 DIAGNOSIS — Z0000 Encounter for general adult medical examination without abnormal findings: Secondary | ICD-10-CM | POA: Diagnosis not present

## 2016-03-26 DIAGNOSIS — I10 Essential (primary) hypertension: Secondary | ICD-10-CM | POA: Diagnosis not present

## 2016-04-09 DIAGNOSIS — T465X1A Poisoning by other antihypertensive drugs, accidental (unintentional), initial encounter: Secondary | ICD-10-CM | POA: Diagnosis not present

## 2016-04-09 DIAGNOSIS — J45909 Unspecified asthma, uncomplicated: Secondary | ICD-10-CM | POA: Diagnosis not present

## 2016-04-09 DIAGNOSIS — E7800 Pure hypercholesterolemia, unspecified: Secondary | ICD-10-CM | POA: Diagnosis not present

## 2016-04-09 DIAGNOSIS — Z7951 Long term (current) use of inhaled steroids: Secondary | ICD-10-CM | POA: Diagnosis not present

## 2016-04-09 DIAGNOSIS — Z79899 Other long term (current) drug therapy: Secondary | ICD-10-CM | POA: Diagnosis not present

## 2016-04-09 DIAGNOSIS — T466X1A Poisoning by antihyperlipidemic and antiarteriosclerotic drugs, accidental (unintentional), initial encounter: Secondary | ICD-10-CM | POA: Diagnosis not present

## 2016-04-09 DIAGNOSIS — T383X1A Poisoning by insulin and oral hypoglycemic [antidiabetic] drugs, accidental (unintentional), initial encounter: Secondary | ICD-10-CM | POA: Diagnosis not present

## 2016-04-09 DIAGNOSIS — Z7982 Long term (current) use of aspirin: Secondary | ICD-10-CM | POA: Diagnosis not present

## 2016-04-09 DIAGNOSIS — Z7984 Long term (current) use of oral hypoglycemic drugs: Secondary | ICD-10-CM | POA: Diagnosis not present

## 2016-04-09 DIAGNOSIS — K219 Gastro-esophageal reflux disease without esophagitis: Secondary | ICD-10-CM | POA: Diagnosis not present

## 2016-04-09 DIAGNOSIS — I10 Essential (primary) hypertension: Secondary | ICD-10-CM | POA: Diagnosis not present

## 2016-04-09 DIAGNOSIS — E119 Type 2 diabetes mellitus without complications: Secondary | ICD-10-CM | POA: Diagnosis not present

## 2016-09-24 DIAGNOSIS — E118 Type 2 diabetes mellitus with unspecified complications: Secondary | ICD-10-CM | POA: Diagnosis not present

## 2016-09-24 DIAGNOSIS — Z0000 Encounter for general adult medical examination without abnormal findings: Secondary | ICD-10-CM | POA: Diagnosis not present

## 2016-09-24 DIAGNOSIS — I10 Essential (primary) hypertension: Secondary | ICD-10-CM | POA: Diagnosis not present

## 2016-09-24 DIAGNOSIS — E1165 Type 2 diabetes mellitus with hyperglycemia: Secondary | ICD-10-CM | POA: Diagnosis not present

## 2016-09-24 DIAGNOSIS — K219 Gastro-esophageal reflux disease without esophagitis: Secondary | ICD-10-CM | POA: Diagnosis not present

## 2016-09-24 DIAGNOSIS — E876 Hypokalemia: Secondary | ICD-10-CM | POA: Diagnosis not present

## 2016-12-24 DIAGNOSIS — I10 Essential (primary) hypertension: Secondary | ICD-10-CM | POA: Diagnosis not present

## 2016-12-24 DIAGNOSIS — K219 Gastro-esophageal reflux disease without esophagitis: Secondary | ICD-10-CM | POA: Diagnosis not present

## 2016-12-24 DIAGNOSIS — E1165 Type 2 diabetes mellitus with hyperglycemia: Secondary | ICD-10-CM | POA: Diagnosis not present

## 2017-02-12 DIAGNOSIS — S60031A Contusion of right middle finger without damage to nail, initial encounter: Secondary | ICD-10-CM | POA: Diagnosis not present

## 2017-02-12 DIAGNOSIS — M79644 Pain in right finger(s): Secondary | ICD-10-CM | POA: Diagnosis not present

## 2017-02-12 DIAGNOSIS — S6991XA Unspecified injury of right wrist, hand and finger(s), initial encounter: Secondary | ICD-10-CM | POA: Diagnosis not present

## 2017-03-26 DIAGNOSIS — E118 Type 2 diabetes mellitus with unspecified complications: Secondary | ICD-10-CM | POA: Diagnosis not present

## 2017-03-26 DIAGNOSIS — E785 Hyperlipidemia, unspecified: Secondary | ICD-10-CM | POA: Diagnosis not present

## 2017-03-26 DIAGNOSIS — K219 Gastro-esophageal reflux disease without esophagitis: Secondary | ICD-10-CM | POA: Diagnosis not present

## 2017-03-26 DIAGNOSIS — E876 Hypokalemia: Secondary | ICD-10-CM | POA: Diagnosis not present

## 2017-03-26 DIAGNOSIS — E1165 Type 2 diabetes mellitus with hyperglycemia: Secondary | ICD-10-CM | POA: Diagnosis not present

## 2017-03-26 DIAGNOSIS — I10 Essential (primary) hypertension: Secondary | ICD-10-CM | POA: Diagnosis not present

## 2017-06-26 DIAGNOSIS — J4540 Moderate persistent asthma, uncomplicated: Secondary | ICD-10-CM | POA: Diagnosis not present

## 2017-06-26 DIAGNOSIS — I10 Essential (primary) hypertension: Secondary | ICD-10-CM | POA: Diagnosis not present

## 2017-06-26 DIAGNOSIS — Z23 Encounter for immunization: Secondary | ICD-10-CM | POA: Diagnosis not present

## 2017-06-26 DIAGNOSIS — E1165 Type 2 diabetes mellitus with hyperglycemia: Secondary | ICD-10-CM | POA: Diagnosis not present

## 2017-09-25 ENCOUNTER — Other Ambulatory Visit (HOSPITAL_COMMUNITY): Payer: Self-pay | Admitting: Internal Medicine

## 2017-09-25 ENCOUNTER — Ambulatory Visit (HOSPITAL_COMMUNITY): Payer: Medicare Other

## 2017-09-25 DIAGNOSIS — Z1389 Encounter for screening for other disorder: Secondary | ICD-10-CM | POA: Diagnosis not present

## 2017-09-25 DIAGNOSIS — Z1331 Encounter for screening for depression: Secondary | ICD-10-CM | POA: Diagnosis not present

## 2017-09-25 DIAGNOSIS — E118 Type 2 diabetes mellitus with unspecified complications: Secondary | ICD-10-CM | POA: Diagnosis not present

## 2017-09-25 DIAGNOSIS — I10 Essential (primary) hypertension: Secondary | ICD-10-CM | POA: Diagnosis not present

## 2017-09-25 DIAGNOSIS — Z1231 Encounter for screening mammogram for malignant neoplasm of breast: Secondary | ICD-10-CM

## 2017-09-25 DIAGNOSIS — Z0001 Encounter for general adult medical examination with abnormal findings: Secondary | ICD-10-CM | POA: Diagnosis not present

## 2017-09-25 DIAGNOSIS — E876 Hypokalemia: Secondary | ICD-10-CM | POA: Diagnosis not present

## 2017-09-25 DIAGNOSIS — Z0000 Encounter for general adult medical examination without abnormal findings: Secondary | ICD-10-CM | POA: Diagnosis not present

## 2017-09-25 DIAGNOSIS — E1165 Type 2 diabetes mellitus with hyperglycemia: Secondary | ICD-10-CM | POA: Diagnosis not present

## 2017-09-26 ENCOUNTER — Ambulatory Visit (HOSPITAL_COMMUNITY)
Admission: RE | Admit: 2017-09-26 | Discharge: 2017-09-26 | Disposition: A | Discharge status: Home / Self Care | Payer: Medicare Other | Source: Ambulatory Visit | Attending: Internal Medicine | Admitting: Internal Medicine

## 2017-09-26 DIAGNOSIS — Z1231 Encounter for screening mammogram for malignant neoplasm of breast: Secondary | ICD-10-CM | POA: Insufficient documentation

## 2017-10-14 DIAGNOSIS — E11319 Type 2 diabetes mellitus with unspecified diabetic retinopathy without macular edema: Secondary | ICD-10-CM | POA: Diagnosis not present

## 2017-10-14 DIAGNOSIS — H524 Presbyopia: Secondary | ICD-10-CM | POA: Diagnosis not present

## 2017-12-15 DIAGNOSIS — I10 Essential (primary) hypertension: Secondary | ICD-10-CM | POA: Diagnosis not present

## 2017-12-16 DIAGNOSIS — R5383 Other fatigue: Secondary | ICD-10-CM | POA: Diagnosis not present

## 2017-12-16 DIAGNOSIS — E039 Hypothyroidism, unspecified: Secondary | ICD-10-CM | POA: Diagnosis not present

## 2017-12-16 DIAGNOSIS — E118 Type 2 diabetes mellitus with unspecified complications: Secondary | ICD-10-CM | POA: Diagnosis not present

## 2017-12-16 DIAGNOSIS — E538 Deficiency of other specified B group vitamins: Secondary | ICD-10-CM | POA: Diagnosis not present

## 2017-12-16 DIAGNOSIS — I10 Essential (primary) hypertension: Secondary | ICD-10-CM | POA: Diagnosis not present

## 2017-12-16 DIAGNOSIS — E559 Vitamin D deficiency, unspecified: Secondary | ICD-10-CM | POA: Diagnosis not present

## 2017-12-16 DIAGNOSIS — E1165 Type 2 diabetes mellitus with hyperglycemia: Secondary | ICD-10-CM | POA: Diagnosis not present

## 2018-03-04 ENCOUNTER — Encounter: Payer: Self-pay | Admitting: Gastroenterology

## 2018-03-10 ENCOUNTER — Encounter: Payer: Self-pay | Admitting: Gastroenterology

## 2018-03-25 DIAGNOSIS — E559 Vitamin D deficiency, unspecified: Secondary | ICD-10-CM | POA: Diagnosis not present

## 2018-03-25 DIAGNOSIS — E876 Hypokalemia: Secondary | ICD-10-CM | POA: Diagnosis not present

## 2018-03-25 DIAGNOSIS — E118 Type 2 diabetes mellitus with unspecified complications: Secondary | ICD-10-CM | POA: Diagnosis not present

## 2018-03-25 DIAGNOSIS — E1165 Type 2 diabetes mellitus with hyperglycemia: Secondary | ICD-10-CM | POA: Diagnosis not present

## 2018-03-25 DIAGNOSIS — I10 Essential (primary) hypertension: Secondary | ICD-10-CM | POA: Diagnosis not present

## 2018-04-07 ENCOUNTER — Other Ambulatory Visit: Payer: Self-pay

## 2018-04-07 NOTE — Patient Outreach (Signed)
[facility]) Care Management  04/07/2018  Mary Robinson 1956/04/02 [id number]   Medication Adherence call to Mary Robinson spoke with patient  she is due on Simvastatin 20 mg and Losartan 100 mg patient said she is no longer taking Simvastatin and Losartan she had already order it from [facility] this is her new pharmacy and is waiting for doctor to approve the medication. Mary Robinson is showing past due under [place].  [facility] Direct Dial [phone]  Fax [phone] [email or web address]

## 2018-06-25 DIAGNOSIS — I10 Essential (primary) hypertension: Secondary | ICD-10-CM | POA: Diagnosis not present

## 2018-06-25 DIAGNOSIS — E785 Hyperlipidemia, unspecified: Secondary | ICD-10-CM | POA: Diagnosis not present

## 2018-06-25 DIAGNOSIS — Z23 Encounter for immunization: Secondary | ICD-10-CM | POA: Diagnosis not present

## 2018-06-25 DIAGNOSIS — E1165 Type 2 diabetes mellitus with hyperglycemia: Secondary | ICD-10-CM | POA: Diagnosis not present

## 2018-09-24 DIAGNOSIS — E1165 Type 2 diabetes mellitus with hyperglycemia: Secondary | ICD-10-CM | POA: Diagnosis not present

## 2018-09-24 DIAGNOSIS — E876 Hypokalemia: Secondary | ICD-10-CM | POA: Diagnosis not present

## 2018-09-24 DIAGNOSIS — Z23 Encounter for immunization: Secondary | ICD-10-CM | POA: Diagnosis not present

## 2018-09-24 DIAGNOSIS — Z0001 Encounter for general adult medical examination with abnormal findings: Secondary | ICD-10-CM | POA: Diagnosis not present

## 2018-09-24 DIAGNOSIS — Z0000 Encounter for general adult medical examination without abnormal findings: Secondary | ICD-10-CM | POA: Diagnosis not present

## 2018-09-24 DIAGNOSIS — Z1389 Encounter for screening for other disorder: Secondary | ICD-10-CM | POA: Diagnosis not present

## 2018-09-24 DIAGNOSIS — I10 Essential (primary) hypertension: Secondary | ICD-10-CM | POA: Diagnosis not present

## 2018-09-24 DIAGNOSIS — E118 Type 2 diabetes mellitus with unspecified complications: Secondary | ICD-10-CM | POA: Diagnosis not present

## 2018-11-02 ENCOUNTER — Other Ambulatory Visit (HOSPITAL_COMMUNITY): Payer: Self-pay | Admitting: Internal Medicine

## 2018-11-02 DIAGNOSIS — Z1231 Encounter for screening mammogram for malignant neoplasm of breast: Secondary | ICD-10-CM

## 2018-11-18 ENCOUNTER — Other Ambulatory Visit: Payer: Self-pay

## 2018-11-18 ENCOUNTER — Ambulatory Visit (HOSPITAL_COMMUNITY)
Admission: RE | Admit: 2018-11-18 | Discharge: 2018-11-18 | Disposition: A | Discharge status: Home / Self Care | Payer: Medicare Other | Source: Ambulatory Visit | Attending: Internal Medicine | Admitting: Internal Medicine

## 2018-11-18 DIAGNOSIS — Z1231 Encounter for screening mammogram for malignant neoplasm of breast: Secondary | ICD-10-CM | POA: Insufficient documentation

## 2019-03-06 DIAGNOSIS — Z79899 Other long term (current) drug therapy: Secondary | ICD-10-CM | POA: Diagnosis not present

## 2019-03-06 DIAGNOSIS — Z7982 Long term (current) use of aspirin: Secondary | ICD-10-CM | POA: Diagnosis not present

## 2019-03-06 DIAGNOSIS — E119 Type 2 diabetes mellitus without complications: Secondary | ICD-10-CM | POA: Diagnosis not present

## 2019-03-06 DIAGNOSIS — Z7984 Long term (current) use of oral hypoglycemic drugs: Secondary | ICD-10-CM | POA: Diagnosis not present

## 2019-03-06 DIAGNOSIS — I10 Essential (primary) hypertension: Secondary | ICD-10-CM | POA: Diagnosis not present

## 2019-03-06 DIAGNOSIS — K219 Gastro-esophageal reflux disease without esophagitis: Secondary | ICD-10-CM | POA: Diagnosis not present

## 2019-03-06 DIAGNOSIS — R0602 Shortness of breath: Secondary | ICD-10-CM | POA: Diagnosis not present

## 2019-04-13 DIAGNOSIS — I10 Essential (primary) hypertension: Secondary | ICD-10-CM | POA: Diagnosis not present

## 2019-04-13 DIAGNOSIS — E1165 Type 2 diabetes mellitus with hyperglycemia: Secondary | ICD-10-CM | POA: Diagnosis not present

## 2019-04-13 DIAGNOSIS — J4530 Mild persistent asthma, uncomplicated: Secondary | ICD-10-CM | POA: Diagnosis not present

## 2019-05-14 DIAGNOSIS — E1165 Type 2 diabetes mellitus with hyperglycemia: Secondary | ICD-10-CM | POA: Diagnosis not present

## 2019-05-14 DIAGNOSIS — I10 Essential (primary) hypertension: Secondary | ICD-10-CM | POA: Diagnosis not present

## 2019-06-13 DIAGNOSIS — E1165 Type 2 diabetes mellitus with hyperglycemia: Secondary | ICD-10-CM | POA: Diagnosis not present

## 2019-06-13 DIAGNOSIS — I10 Essential (primary) hypertension: Secondary | ICD-10-CM | POA: Diagnosis not present

## 2019-06-18 DIAGNOSIS — Z23 Encounter for immunization: Secondary | ICD-10-CM | POA: Diagnosis not present

## 2019-07-05 DIAGNOSIS — I10 Essential (primary) hypertension: Secondary | ICD-10-CM | POA: Diagnosis not present

## 2019-07-05 DIAGNOSIS — E1165 Type 2 diabetes mellitus with hyperglycemia: Secondary | ICD-10-CM | POA: Diagnosis not present

## 2019-07-05 DIAGNOSIS — J4530 Mild persistent asthma, uncomplicated: Secondary | ICD-10-CM | POA: Diagnosis not present

## 2019-08-04 DIAGNOSIS — K219 Gastro-esophageal reflux disease without esophagitis: Secondary | ICD-10-CM | POA: Diagnosis not present

## 2019-08-04 DIAGNOSIS — E1165 Type 2 diabetes mellitus with hyperglycemia: Secondary | ICD-10-CM | POA: Diagnosis not present

## 2019-08-04 DIAGNOSIS — I10 Essential (primary) hypertension: Secondary | ICD-10-CM | POA: Diagnosis not present

## 2019-09-04 DIAGNOSIS — K219 Gastro-esophageal reflux disease without esophagitis: Secondary | ICD-10-CM | POA: Diagnosis not present

## 2019-09-04 DIAGNOSIS — I10 Essential (primary) hypertension: Secondary | ICD-10-CM | POA: Diagnosis not present

## 2019-10-08 DIAGNOSIS — E785 Hyperlipidemia, unspecified: Secondary | ICD-10-CM | POA: Diagnosis not present

## 2019-10-08 DIAGNOSIS — I10 Essential (primary) hypertension: Secondary | ICD-10-CM | POA: Diagnosis not present

## 2019-10-08 DIAGNOSIS — E1165 Type 2 diabetes mellitus with hyperglycemia: Secondary | ICD-10-CM | POA: Diagnosis not present

## 2019-10-08 DIAGNOSIS — Z1389 Encounter for screening for other disorder: Secondary | ICD-10-CM | POA: Diagnosis not present

## 2019-10-08 DIAGNOSIS — Z0001 Encounter for general adult medical examination with abnormal findings: Secondary | ICD-10-CM | POA: Diagnosis not present

## 2019-10-11 ENCOUNTER — Other Ambulatory Visit (HOSPITAL_COMMUNITY): Payer: Self-pay | Admitting: Internal Medicine

## 2019-10-11 DIAGNOSIS — Z1231 Encounter for screening mammogram for malignant neoplasm of breast: Secondary | ICD-10-CM

## 2019-11-05 DIAGNOSIS — I10 Essential (primary) hypertension: Secondary | ICD-10-CM | POA: Diagnosis not present

## 2019-11-05 DIAGNOSIS — E1165 Type 2 diabetes mellitus with hyperglycemia: Secondary | ICD-10-CM | POA: Diagnosis not present

## 2019-11-09 ENCOUNTER — Encounter: Payer: Self-pay | Admitting: Gastroenterology

## 2019-11-09 ENCOUNTER — Other Ambulatory Visit: Payer: Self-pay

## 2019-11-09 ENCOUNTER — Encounter: Payer: Self-pay | Admitting: *Deleted

## 2019-11-09 ENCOUNTER — Ambulatory Visit: Payer: Medicare Other | Admitting: Gastroenterology

## 2019-11-09 VITALS — BP 148/90 | HR 93 | Temp 96.8°F | Ht 64.0 in | Wt 215.6 lb

## 2019-11-09 DIAGNOSIS — R002 Palpitations: Secondary | ICD-10-CM | POA: Diagnosis not present

## 2019-11-09 DIAGNOSIS — K219 Gastro-esophageal reflux disease without esophagitis: Secondary | ICD-10-CM

## 2019-11-09 DIAGNOSIS — R1900 Intra-abdominal and pelvic swelling, mass and lump, unspecified site: Secondary | ICD-10-CM | POA: Diagnosis not present

## 2019-11-09 DIAGNOSIS — R0600 Dyspnea, unspecified: Secondary | ICD-10-CM

## 2019-11-09 DIAGNOSIS — R6881 Early satiety: Secondary | ICD-10-CM

## 2019-11-09 DIAGNOSIS — R198 Other specified symptoms and signs involving the digestive system and abdomen: Secondary | ICD-10-CM | POA: Diagnosis not present

## 2019-11-09 LAB — BASIC METABOLIC PANEL WITH GFR
BUN/Creatinine Ratio: 20 (calc) (ref 6–22)
BUN: 21 mg/dL (ref 7–25)
CO2: 29 mmol/L (ref 20–32)
Calcium: 9.6 mg/dL (ref 8.6–10.4)
Chloride: 101 mmol/L (ref 98–110)
Creat: 1.05 mg/dL — ABNORMAL HIGH (ref 0.50–0.99)
GFR, Est African American: 64 mL/min/{1.73_m2} (ref >=60)
GFR, Est Non African American: 55 mL/min/{1.73_m2} — ABNORMAL LOW (ref >=60)
Glucose, Bld: 152 mg/dL — ABNORMAL HIGH (ref 65–139)
Potassium: 4.6 mmol/L (ref 3.5–5.3)
Sodium: 138 mmol/L (ref 135–146)

## 2019-11-09 NOTE — Patient Instructions (Addendum)
We are arranging a CT in the near future to evaluate the bulge, which I feel is likely a hernia. Please have blood work done to check your kidney function prior.   Start taking Metamucil daily. Let me know if this helps even out your bowel habits.  Continue omeprazole (Prilosec) once each morning, 30 minutes before breakfast. You can take Pepcid in the evening for any breakthrough reflux. Do not eat or drink for 2-3 hours prior to laying down to avoid nighttime reflux.  I am referring you to the heart doctor. Please ask Dr. Legrand Rams if you can have a sleep test arranged to evaluate for sleep apnea.   We will see you back in 4-6 weeks!  I enjoyed seeing you again today! As you know, I value our relationship and want to provide genuine, compassionate, and quality care. I welcome your feedback. If you receive a survey regarding your visit,  I greatly appreciate you taking time to fill this out. See you next time!  Annitta Needs, PhD, ANP-BC [facility]   Gastroesophageal Reflux Disease, Adult Gastroesophageal reflux (GER) happens when acid from the stomach flows up into the tube that connects the mouth and the stomach (esophagus). Normally, food travels down the esophagus and stays in the stomach to be digested. With GER, food and stomach acid sometimes move back up into the esophagus. You may have a disease called gastroesophageal reflux disease (GERD) if the reflux:  Happens often.  Causes frequent or very bad symptoms.  Causes problems such as damage to the esophagus. When this happens, the esophagus becomes sore and swollen (inflamed). Over time, GERD can make small holes (ulcers) in the lining of the esophagus. What are the causes? This condition is caused by a problem with the muscle between the esophagus and the stomach. When this muscle is weak or not normal, it does not close properly to keep food and acid from coming back up from the stomach. The muscle can be weak  because of:  Tobacco use.  Pregnancy.  Having a certain type of hernia (hiatal hernia).  Alcohol use.  Certain foods and drinks, such as coffee, chocolate, onions, and peppermint. What increases the risk? You are more likely to develop this condition if you:  Are overweight.  Have a disease that affects your connective tissue.  Use NSAID medicines. What are the signs or symptoms? Symptoms of this condition include:  Heartburn.  Difficult or painful swallowing.  The feeling of having a lump in the throat.  A bitter taste in the mouth.  Bad breath.  Having a lot of saliva.  Having an upset or bloated stomach.  Belching.  Chest pain. Different conditions can cause chest pain. Make sure you see your doctor if you have chest pain.  Shortness of breath or noisy breathing (wheezing).  Ongoing (chronic) cough or a cough at night.  Wearing away of the surface of teeth (tooth enamel).  Weight loss. How is this treated? Treatment will depend on how bad your symptoms are. Your doctor may suggest:  Changes to your diet.  Medicine.  Surgery. Follow these instructions at home: Eating and drinking   Follow a diet as told by your doctor. You may need to avoid foods and drinks such as: ? Coffee and tea (with or without caffeine). ? Drinks that contain alcohol. ? Energy drinks and sports drinks. ? Bubbly (carbonated) drinks or sodas. ? Chocolate and cocoa. ? Peppermint and mint flavorings. ? Garlic and onions. ? Horseradish. ?  Spicy and acidic foods. These include peppers, chili powder, curry powder, vinegar, hot sauces, and BBQ sauce. ? Citrus fruit juices and citrus fruits, such as oranges, lemons, and limes. ? Tomato-based foods. These include red sauce, chili, salsa, and pizza with red sauce. ? Fried and fatty foods. These include donuts, french fries, potato chips, and high-fat dressings. ? High-fat meats. These include hot dogs, rib eye steak, sausage, ham,  and bacon. ? High-fat dairy items, such as whole milk, butter, and cream cheese.  Eat small meals often. Avoid eating large meals.  Avoid drinking large amounts of liquid with your meals.  Avoid eating meals during the 2-3 hours before bedtime.  Avoid lying down right after you eat.  Do not exercise right after you eat. Lifestyle   Do not use any products that contain nicotine or tobacco. These include cigarettes, e-cigarettes, and chewing tobacco. If you need help quitting, ask your doctor.  Try to lower your stress. If you need help doing this, ask your doctor.  If you are overweight, lose an amount of weight that is healthy for you. Ask your doctor about a safe weight loss goal. General instructions  Pay attention to any changes in your symptoms.  Take over-the-counter and prescription medicines only as told by your doctor. Do not take aspirin, ibuprofen, or other NSAIDs unless your doctor says it is okay.  Wear loose clothes. Do not wear anything tight around your waist.  Raise (elevate) the head of your bed about 6 inches (15 cm).  Avoid bending over if this makes your symptoms worse.  Keep all follow-up visits as told by your doctor. This is important. Contact a doctor if:  You have new symptoms.  You lose weight and you do not know why.  You have trouble swallowing or it hurts to swallow.  You have wheezing or a cough that keeps happening.  Your symptoms do not get better with treatment.  You have a hoarse voice. Get help right away if:  You have pain in your arms, neck, jaw, teeth, or back.  You feel sweaty, dizzy, or light-headed.  You have chest pain or shortness of breath.  You throw up (vomit) and your throw-up looks like blood or coffee grounds.  You pass out (faint).  Your poop (stool) is bloody or black.  You cannot swallow, drink, or eat. Summary  If a person has gastroesophageal reflux disease (GERD), food and stomach acid move back up  into the esophagus and cause symptoms or problems such as damage to the esophagus.  Treatment will depend on how bad your symptoms are.  Follow a diet as told by your doctor.  Take all medicines only as told by your doctor. This information is not intended to replace advice given to you by your health care provider. Make sure you discuss any questions you have with your health care provider. Document Revised: 02/25/2018 Document Reviewed: 02/25/2018 Elsevier Patient Education  [employer].

## 2019-11-09 NOTE — Progress Notes (Signed)
Primary Care Physician:  Rosita Fire, MD Primary Gastroenterologist:  Dr. Gala Romney   Chief Complaint  Patient presents with  . abdominal swelling    mid upper abd    HPI:   Mary Robinson is a 64 y.o. female presenting today due to abdominal swelling. Last seen in 2014 with history of multiple tubular adenomas in 2011, normal surveillance in 2014. Chronic constipation. She was worried about abdominal bulging in 2014 as well. CT in 2014 with fatty liver, supraumbilical fat-containing hernia with second hernia in umbilical region containing omental fat, small midline infraumbilical new ventral hernia. She underwent laparoscopic repair of multiple ventral hernia in the midline with mesh  by Dr. Dalbert Batman in Sept 2014.   Remote history of H.pylori gastritis s/p treatment in 2011. Need to document eradication. Doesn't feel she can be off PPI for 2 weeks to do this. Not eating late at night.   Chronic GERD. Feeling full, bloated, gassy. Has dry cough. Sharp pain at times in RLQ. Alternating diarrhea and constipation. No appetite at times ranging to good appetite. Easily loses 3 lbs then gains back. Nausea. Tired more easily. Not able to walk as much without giving out of breath. Used to shop long time without issues. Symptoms of SOB within the past year. Snores a night and worried about apnea. No dysphagia.   Taking omeprazole for GERD. If she misses days, she will start feeling acid. Within last month has had a dry cough. Notices it early in the mornings. No new medication changes.   Sometimes will go 2 days without a BM, then sometimes gets up with diarrhea. At times will take Metamucil. Metformin causing looser stool at times. Takes metformin BID. Feels like she has even amounts of constipation and diarrhea. Feels less gassy and bloated if had a good BM. No rectal bleeding.   Nausea at times within the last month, early satiety for the past few months. Believes her A1c was under 7 awhile ago  but has had blood work since then. No lower extremity edema. Denies chest pain. Sometimes feels like heart is fluttering. Scares her and wonders if she is going to have a heart attack but no chest pain. No cardiologist. In November her magnesium was low, now on supplements. Since that episode, feels tired and can't do much activity without giving up. Trying to stay busy. Wonders if she has sleep apnea. Wakes up tired. Snoring at night.   Past Medical History:  Diagnosis Date  . Anemia   . Anxiety   . Arthritis    "knees; neck, low back, R shoulder ( for which she rec'd cortisone injection once" (05/04/2013)  . Asthma   . Bursitis   . Chest pain, unspecified   . Chronic back pain    "all over; starts in my lower back" (05/04/2013)  . Chronic constipation   . Depression   . Diastasis recti   . GERD (gastroesophageal reflux disease)   . Helicobacter pylori gastritis    s/p treatment may 2011  . HTN (hypertension)    pt. seen by cardiac ( Dr. Dannielle Burn in [place]) for cardiac workup in 2010, after she had BS of 900+, told all was WNL  . Hyperlipidemia   . Obesity   . S/P colonoscopy May 2011   multiple tubular adenomas: next TCS May 2014  . S/P endoscopy May 2011   severe erosive reflux esophagitis, noncritial schatzki's ring, +h.pylori  . Seasonal allergies   . Sleep apnea    "  small amount"; denies use of mask on 05/04/2013  . Type II diabetes mellitus ([place])    treated /w oral hypoglycemics, BS was high as 900 in 2010, treated at [facility] & initially started on Insulin :"that only lasted a few months"; is currently controlled on oral only/pt report 05/04/2013  . Umbilical hernia     Past Surgical History:  Procedure Laterality Date  . CHOLECYSTECTOMY  1999  . COLONOSCOPY  May 2011   Dr. Gala Romney: long, torturous colon, multiple colonic polyps. TUBULAR ADENOMAS  . COLONOSCOPY N/A 04/15/2013   normal rectum, torturous colon.   Marland Kitchen DILATION AND CURETTAGE OF UTERUS  1970's  .  ESOPHAGOGASTRODUODENOSCOPY  May 2011   Dr. Gala Romney: severe erosive reflux esophagitis, non-critical Schatzki's ring, diffuse gastric erosions. H. PYLORI GASTRITIS  . HERNIA REPAIR  05/04/2013   laparoscopic incisional hernia repair w/mesh per notes(05/04/2013)  . INCISIONAL HERNIA REPAIR N/A 05/04/2013   Procedure: LAPAROSCOPIC INCISIONAL HERNIA ;  Surgeon: Adin Hector, MD;  Location: [place];  Service: General;  Laterality: N/A;  . INSERTION OF MESH N/A 05/04/2013   Procedure: INSERTION OF MESH;  Surgeon: Adin Hector, MD;  Location: [place];  Service: General;  Laterality: N/A;  . TUBAL LIGATION  1970's    Current Outpatient Medications  Medication Sig Dispense Refill  . acetaminophen (TYLENOL) 500 MG tablet Take 500-1,000 mg by mouth every 6 (six) hours as needed.    Marland Kitchen amLODipine (NORVASC) 10 MG tablet Take 10 mg by mouth daily with breakfast.     . aspirin 81 MG tablet Take 81 mg by mouth daily.      . hydrochlorothiazide (HYDRODIURIL) 25 MG tablet Take 25 mg by mouth daily.    Marland Kitchen loratadine (CLARITIN) 10 MG tablet Take 10 mg by mouth daily.      Marland Kitchen losartan (COZAAR) 100 MG tablet Take 1 tablet (100 mg total) by mouth once. (Patient taking differently: Take 100 mg by mouth daily. ) 30 tablet 0  . magnesium oxide (MAG-OX) 400 MG tablet Take 1 tablet by mouth daily.    . metFORMIN (GLUCOPHAGE) 1000 MG tablet Take 1,000 mg by mouth 2 (two) times daily.    Marland Kitchen omeprazole (PRILOSEC) 20 MG capsule Take 20 mg by mouth daily with breakfast.     . polyethylene glycol (MIRALAX / GLYCOLAX) packet Take 17 g by mouth daily as needed (for constipation). 30 packet 5  . simvastatin (ZOCOR) 20 MG tablet Take 20 mg by mouth daily.    Marland Kitchen spironolactone (ALDACTONE) 25 MG tablet Take 25 mg by mouth 2 (two) times daily.    . Vitamin D, Ergocalciferol, (DRISDOL) 1.25 MG (50000 UNIT) CAPS capsule Take 50,000 Units by mouth every 30 (thirty) days.    Marland Kitchen acetaminophen (TYLENOL) 650 MG CR tablet Take 650 mg by mouth daily  as needed for pain.     Marland Kitchen Fluticasone-Salmeterol (ADVAIR DISKUS) 100-50 MCG/DOSE AEPB Inhale 1 puff into the lungs 2 (two) times daily.     . metFORMIN (GLUCOPHAGE) 850 MG tablet Take 850 mg by mouth 2 (two) times daily with a meal.      . saxagliptin HCl (ONGLYZA) 5 MG TABS tablet Take 5 mg by mouth daily with breakfast.     . simvastatin (ZOCOR) 40 MG tablet Take 40 mg by mouth at bedtime.       No current facility-administered medications for this visit.    Allergies as of 11/09/2019  . (No Known Allergies)    Family History  Problem Relation Age of Onset  . Colon cancer Maternal Uncle   . Colon cancer Paternal Uncle   . COPD Mother   . Hypertension Mother   . Seizures Mother   . Kidney disease Mother        renal failure  . Heart disease Father   . Diabetes Sister   . Hypertension Sister   . Heart disease Sister   . Diabetes Brother   . Hypertension Brother   . Hypertension Daughter   . Hypertension Son   . Diabetes Sister   . Hypertension Sister   . Diabetes Sister   . Hypertension Sister     Social History   Socioeconomic History  . Marital status: Divorced    Spouse name: Not on file  . Number of children: Not on file  . Years of education: Not on file  . Highest education level: Not on file  Occupational History  . Not on file  Tobacco Use  . Smoking status: Never Smoker  . Smokeless tobacco: Never Used  Substance and Sexual Activity  . Alcohol use: No  . Drug use: No  . Sexual activity: Never    Birth control/protection: Post-menopausal  Other Topics Concern  . Not on file  Social History Narrative  . Not on file   Social Determinants of Health   Financial Resource Strain:   . Difficulty of Paying Living Expenses: Not on file  Food Insecurity:   . Worried About Charity fundraiser in the Last Year: Not on file  . Ran Out of Food in the Last Year: Not on file  Transportation Needs:   . Lack of Transportation (Medical): Not on file  . Lack of  Transportation (Non-Medical): Not on file  Physical Activity:   . Days of Exercise per Week: Not on file  . Minutes of Exercise per Session: Not on file  Stress:   . Feeling of Stress : Not on file  Social Connections:   . Frequency of Communication with Friends and Family: Not on file  . Frequency of Social Gatherings with Friends and Family: Not on file  . Attends Religious Services: Not on file  . Active Member of Clubs or Organizations: Not on file  . Attends Archivist Meetings: Not on file  . Marital Status: Not on file  Intimate Partner Violence:   . Fear of Current or Ex-Partner: Not on file  . Emotionally Abused: Not on file  . Physically Abused: Not on file  . Sexually Abused: Not on file    Review of Systems: Gen: see HPI CV: see HPI Resp: Denies shortness of breath at rest or with exertion. Denies wheezing or cough.  GI: see HPI GU : Denies urinary burning, urinary frequency, urinary hesitancy MS: Denies joint pain, muscle weakness, cramps, or limitation of movement.  Derm: Denies rash, itching, dry skin Psych: Denies depression, anxiety, memory loss, and confusion Heme: see HPI  Physical Exam: BP (!) 148/90   Pulse 93   Temp (!) 96.8 F (36 C) (Temporal)   Ht 5\' 4"  (1.626 m)   Wt 215 lb 9.6 oz (97.8 kg)   BMI 37.01 kg/m  General:   Alert and oriented. Pleasant and cooperative. Well-nourished and well-developed.  Head:  Normocephalic and atraumatic. Eyes:  Without icterus, sclera clear and conjunctiva pink.  Ears:  Normal auditory acuity. Mouth:  Mask in place  Lungs:  Clear to auscultation bilaterally. No wheezes, rales, or rhonchi. No distress.  Heart:  S1, S2 present without murmurs appreciated.  Abdomen:  +BS, soft, non-tender and non-distended. Query midline ventral hernia. Round.  Rectal:  Deferred  Msk:  Symmetrical without gross deformities. Normal posture. Extremities:  Without edema. Neurologic:  Alert and  oriented x4;  grossly  normal neurologically. Psych:  Alert and cooperative. Normal mood and affect.  ASSESSMENT: Mary Robinson is a 64 y.o. female presenting today with history of abdominal swelling, prior multiple ventral hernias in midline s/p repair with mesh in 2014, presenting with concerns for recurrent hernia, chronic GERD, alternating constipation and diarrhea.   Possible recurrent hernia: will pursue CT in future with BMP prior.   GERD: discussed dietary/behavior changes. Take Pepcid as needed in evenings. No alarm signs/symptoms.  Alternating constipation/diarrhea: with unproductive stools. Trial of Metamucil daily. Call if no improvement.   History of H.pylori: need to document eradication, but she is unable to come off PPI currently. Will revisit this after current concerns addressed.   Palpitations: refer to cardiology. I have also asked her to contact PCP regarding sleep study.    PLAN:  CT abd/pelvis with contrast in near future, BMP prior  Continue PPI daily, add pepcid prn in evenings  Supplemental fiber daily  Cardiology referral due to palpitations  Recommend sleep study: patient will discuss with PCP  Return for close follow-up in 4-6 weeks  Annitta Needs, PhD, ANP-BC [facility]

## 2019-11-24 ENCOUNTER — Other Ambulatory Visit: Payer: Self-pay

## 2019-11-24 ENCOUNTER — Ambulatory Visit (HOSPITAL_COMMUNITY): Payer: Medicare Other

## 2019-11-24 ENCOUNTER — Ambulatory Visit (HOSPITAL_COMMUNITY)
Admission: RE | Admit: 2019-11-24 | Discharge: 2019-11-24 | Disposition: A | Discharge status: Home / Self Care | Payer: Medicare Other | Source: Ambulatory Visit | Attending: Internal Medicine | Admitting: Internal Medicine

## 2019-11-24 DIAGNOSIS — Z1231 Encounter for screening mammogram for malignant neoplasm of breast: Secondary | ICD-10-CM | POA: Insufficient documentation

## 2019-12-01 ENCOUNTER — Other Ambulatory Visit: Payer: Self-pay

## 2019-12-01 ENCOUNTER — Ambulatory Visit (HOSPITAL_COMMUNITY)
Admission: RE | Admit: 2019-12-01 | Discharge: 2019-12-01 | Disposition: A | Discharge status: Home / Self Care | Payer: Medicare Other | Source: Ambulatory Visit | Attending: Gastroenterology | Admitting: Gastroenterology

## 2019-12-01 DIAGNOSIS — K429 Umbilical hernia without obstruction or gangrene: Secondary | ICD-10-CM | POA: Diagnosis not present

## 2019-12-01 DIAGNOSIS — R1900 Intra-abdominal and pelvic swelling, mass and lump, unspecified site: Secondary | ICD-10-CM | POA: Diagnosis not present

## 2019-12-01 MED ORDER — IOHEXOL 300 MG/ML  SOLN
100.0000 mL | Freq: Once | INTRAMUSCULAR | Status: AC | PRN
  Administered 2019-12-01: 100 mL via INTRAVENOUS

## 2019-12-06 DIAGNOSIS — E1165 Type 2 diabetes mellitus with hyperglycemia: Secondary | ICD-10-CM | POA: Diagnosis not present

## 2019-12-06 DIAGNOSIS — I10 Essential (primary) hypertension: Secondary | ICD-10-CM | POA: Diagnosis not present

## 2019-12-09 ENCOUNTER — Ambulatory Visit (INDEPENDENT_AMBULATORY_CARE_PROVIDER_SITE_OTHER): Payer: Medicare Other

## 2019-12-09 ENCOUNTER — Ambulatory Visit: Payer: Medicare Other | Admitting: Cardiology

## 2019-12-09 ENCOUNTER — Other Ambulatory Visit: Payer: Self-pay

## 2019-12-09 ENCOUNTER — Encounter: Payer: Self-pay | Admitting: Cardiology

## 2019-12-09 VITALS — BP 136/88 | HR 102 | Temp 98.7°F | Ht 64.0 in | Wt 220.0 lb

## 2019-12-09 DIAGNOSIS — R002 Palpitations: Secondary | ICD-10-CM

## 2019-12-09 DIAGNOSIS — R0602 Shortness of breath: Secondary | ICD-10-CM | POA: Diagnosis not present

## 2019-12-09 DIAGNOSIS — I10 Essential (primary) hypertension: Secondary | ICD-10-CM

## 2019-12-09 DIAGNOSIS — R9431 Abnormal electrocardiogram [ECG] [EKG]: Secondary | ICD-10-CM | POA: Diagnosis not present

## 2019-12-09 DIAGNOSIS — G4733 Obstructive sleep apnea (adult) (pediatric): Secondary | ICD-10-CM | POA: Diagnosis not present

## 2019-12-09 NOTE — Progress Notes (Signed)
Cardiology Office Note  Date: 12/09/2019   ID: Mary Robinson, DOB December 12, 1955, MRN [id number]  PCP:  Rosita Fire, MD  Cardiologist:  Rozann Lesches, MD Electrophysiologist:  None   Chief Complaint  Patient presents with  . Palpitations    History of Present Illness: Mary Robinson is a 64 y.o. female referred for cardiology consultation by Ms. Cyndi Bender NP for the evaluation of palpitations.  She states that for, quite a long time" she has been experiencing intermittent episodes of a fast or skipping heartbeat, mainly in the evenings when she first settles down to go to sleep.  She does not feel this during other activities, has had no syncope.  She does mention a feeling of fatigue and shortness of breath with activities, but this is not associated with a sense of palpitations.  She has no known history of ischemic heart disease or cardiac arrhythmia.  I personally reviewed her ECG today which shows normal sinus rhythm, rule out old inferior infarct pattern.  I reviewed her medications which include aspirin, Norvasc, HCTZ, Cozaar, Aldactone, and Zocor.  She states that she follows with Dr. Legrand Rams, I do not have access to her lipid panel.  Recent CT of the abdomen and pelvis showed an incidentally noted left lower lobe pulmonary nodule, evidence of previous hernia mesh repair, will lower lumbar degenerative arthropathy, and aortoiliac atherosclerosis.  Past Medical History:  Diagnosis Date  . Anemia   . Anxiety   . Arthritis   . Asthma   . Bursitis   . Chronic back pain   . Chronic constipation   . Depression   . Diastasis recti   . Essential hypertension   . GERD (gastroesophageal reflux disease)   . Helicobacter pylori gastritis    Treated in May 2011  . Hyperlipidemia   . Obesity   . S/P colonoscopy 12/2009   Multiple tubular adenomas: next TCS May 2014  . S/P endoscopy 12/2009   Severe erosive reflux esophagitis, noncritial schatzki's ring, +h.pylori  . Seasonal  allergies   . Sleep apnea   . Type II diabetes mellitus ([place])   . Umbilical hernia     Past Surgical History:  Procedure Laterality Date  . CHOLECYSTECTOMY  1999  . COLONOSCOPY  May 2011   Dr. Gala Romney: long, torturous colon, multiple colonic polyps. TUBULAR ADENOMAS  . COLONOSCOPY N/A 04/15/2013   normal rectum, torturous colon.   Marland Kitchen DILATION AND CURETTAGE OF UTERUS  1970's  . ESOPHAGOGASTRODUODENOSCOPY  May 2011   Dr. Gala Romney: severe erosive reflux esophagitis, non-critical Schatzki's ring, diffuse gastric erosions. H. PYLORI GASTRITIS  . HERNIA REPAIR  05/04/2013   laparoscopic incisional hernia repair w/mesh per notes(05/04/2013)  . INCISIONAL HERNIA REPAIR N/A 05/04/2013   Procedure: LAPAROSCOPIC INCISIONAL HERNIA ;  Surgeon: Adin Hector, MD;  Location: [place];  Service: General;  Laterality: N/A;  . INSERTION OF MESH N/A 05/04/2013   Procedure: INSERTION OF MESH;  Surgeon: Adin Hector, MD;  Location: [place];  Service: General;  Laterality: N/A;  . TUBAL LIGATION  1970's    Current Outpatient Medications  Medication Sig Dispense Refill  . acetaminophen (TYLENOL) 500 MG tablet Take 500-1,000 mg by mouth every 6 (six) hours as needed.    Marland Kitchen amLODipine (NORVASC) 10 MG tablet Take 10 mg by mouth daily with breakfast.     . aspirin 81 MG tablet Take 81 mg by mouth daily.      . hydrochlorothiazide (HYDRODIURIL) 25 MG tablet Take 25  mg by mouth daily.    Marland Kitchen loratadine (CLARITIN) 10 MG tablet Take 10 mg by mouth daily.      Marland Kitchen losartan (COZAAR) 100 MG tablet Take 1 tablet (100 mg total) by mouth once. (Patient taking differently: Take 100 mg by mouth daily. ) 30 tablet 0  . magnesium oxide (MAG-OX) 400 MG tablet Take 1 tablet by mouth daily.    . metFORMIN (GLUCOPHAGE) 1000 MG tablet Take 1,000 mg by mouth 2 (two) times daily.    Marland Kitchen omeprazole (PRILOSEC) 20 MG capsule Take 20 mg by mouth daily with breakfast.     . polyethylene glycol (MIRALAX / GLYCOLAX) packet Take 17 g by mouth daily as  needed (for constipation). 30 packet 5  . simvastatin (ZOCOR) 20 MG tablet Take 20 mg by mouth daily.    Marland Kitchen spironolactone (ALDACTONE) 25 MG tablet Take 25 mg by mouth 2 (two) times daily.    . TRADJENTA 5 MG TABS tablet Take 5 mg by mouth daily.    . Vitamin D, Ergocalciferol, (DRISDOL) 1.25 MG (50000 UNIT) CAPS capsule Take 50,000 Units by mouth every 30 (thirty) days.     No current facility-administered medications for this visit.   Allergies:  Patient has no known allergies.   Social History: The patient  reports that she has never smoked. She has never used smokeless tobacco. She reports that she does not drink alcohol or use drugs.   Family History: The patient's family history includes COPD in her mother; Chronic Renal Failure in her mother; Colon cancer in her maternal uncle and paternal uncle; Diabetes in her brother, sister, sister, and sister; Heart disease in her father and sister; Hypertension in her brother, daughter, mother, sister, sister, sister, and son; Seizures in her mother.   ROS:   No sudden dizziness or syncope.  Physical Exam: VS:  BP 136/88   Pulse (!) 102   Temp 98.7 F (37.1 C)   Ht 5\' 4"  (1.626 m)   Wt 220 lb (99.8 kg)   SpO2 95%   BMI 37.76 kg/m , BMI Body mass index is 37.76 kg/m.  Wt Readings from Last 3 Encounters:  12/09/19 220 lb (99.8 kg)  11/09/19 215 lb 9.6 oz (97.8 kg)  01/19/16 183 lb (83 kg)    General: Patient appears comfortable at rest. HEENT: Conjunctiva and lids normal, wearing a mask Neck: Supple, no elevated JVP or carotid bruits, no thyromegaly. Lungs: Clear to auscultation, nonlabored breathing at rest. Cardiac: Regular rate and rhythm, no S3, soft systolic murmur. Abdomen: Soft, nontender, bowel sounds present. Extremities: No pitting edema, distal pulses 2+. Skin: Warm and dry. Musculoskeletal: No kyphosis. Neuropsychiatric: Alert and oriented x3, affect grossly appropriate.  ECG:  An ECG dated 04/30/2013 was personally  reviewed today and demonstrated:  Sinus rhythm with nonspecific ST-T changes.  Recent Labwork: 11/09/2019: BUN 21; Creat 1.05; Potassium 4.6; Sodium 138   Other Studies Reviewed Today:  No prior cardiac testing available for review today.  Assessment and Plan:  1.  Intermittent sense of palpitations as discussed above, no associated syncope.  She is in sinus rhythm by ECG today.  States that symptoms are intermittent and have been noticeable for quite some time.  No history of ischemic heart disease or cardiac arrhythmia, cannot exclude old inferior infarct pattern by ECG however.  We will obtain an echocardiogram for cardiac structural evaluation and also a 7-day ZIO AT.  Further plans to follow.  2.  Essential hypertension, currently on Norvasc, HCTZ,  losartan, and Aldactone.  Systolic is in the [id number] today.  3.  Mixed hyperlipidemia by history, on Zocor with follow-up by Dr. Legrand Rams.  4.  History of OSA, has been referred for follow-up sleep testing per discussion.  Medication Adjustments/Labs and Tests Ordered: Current medicines are reviewed at length with the patient today.  Concerns regarding medicines are outlined above.   Tests Ordered: Orders Placed This Encounter  Procedures  . CARDIAC EVENT MONITOR  . EKG 12-Lead  . ECHOCARDIOGRAM COMPLETE    Medication Changes: No orders of the defined types were placed in this encounter.   Disposition:  Follow up test results and determine next step.  Signed, Satira Sark, MD, [facility] 12/09/2019 1:34 PM    [facility] at [facility] [address], [place], [place] [address] Phone: [phone]; Fax: [phone]

## 2019-12-09 NOTE — Patient Instructions (Signed)
Medication Instructions:  Your physician recommends that you continue on your current medications as directed. Please refer to the Current Medication list given to you today.  *If you need a refill on your cardiac medications before your next appointment, please call your pharmacy*   Lab Work: NONE   If you have labs (blood work) drawn today and your tests are completely normal, you will receive your results only by: Marland Kitchen MyChart Message (if you have MyChart) OR . A paper copy in the mail If you have any lab test that is abnormal or we need to change your treatment, we will call you to review the results.   Testing/Procedures: Your physician has requested that you have an echocardiogram. Echocardiography is a painless test that uses sound waves to create images of your heart. It provides your doctor with information about the size and shape of your heart and how well your heart's chambers and valves are working. This procedure takes approximately one hour. There are no restrictions for this procedure.  Your physician has recommended that you wear an event monitor. Event monitors are medical devices that record the heart's electrical activity. Doctors most often [place] these monitors to diagnose arrhythmias. Arrhythmias are problems with the speed or rhythm of the heartbeat. The monitor is a small, portable device. You can wear one while you do your normal daily activities. This is usually used to diagnose what is causing palpitations/syncope (passing out).   Follow-Up: At [facility], you and your health needs are our priority.  As part of our continuing mission to provide you with exceptional heart care, we have created designated Provider Care Teams.  These Care Teams include your primary Cardiologist (physician) and Advanced Practice Providers (APPs -  Physician Assistants and Nurse Practitioners) who all work together to provide you with the care you need, when you need it.  We recommend  signing up for the patient portal called "MyChart".  Sign up information is provided on this After Visit Summary.  MyChart is used to connect with patients for Virtual Visits (Telemedicine).  Patients are able to view lab/test results, encounter notes, upcoming appointments, etc.  Non-urgent messages can be sent to your provider as well.   To learn more about what you can do with MyChart, go to [email or web address].    Your next appointment:    Pending test results   The format for your next appointment:   Either In Person or Virtual  Provider:   Rozann Lesches, MD   Other Instructions Thank you for choosing [facility]!

## 2019-12-10 DIAGNOSIS — R002 Palpitations: Secondary | ICD-10-CM | POA: Diagnosis not present

## 2019-12-13 NOTE — Progress Notes (Addendum)
Referring Provider: Rosita Fire, MD Primary Care Physician:  Rosita Fire, MD Primary GI: Dr. Gala Romney    Chief Complaint  Patient presents with  . Gastroesophageal Reflux    f/u  . Abdominal Pain    occas will have sharp pain prior to BM but after BM will go away    HPI:   Mary Robinson is a 64 y.o. female presenting today with a history of chronic constipation, laparoscopic repair of multiple ventral hernias in the midline with mesh  by Dr. Dalbert Batman in Sept 2014. Remote history of H.pylori gastritis s/p treatment in 2011, with need to document eradication but unable to come off PPI for 2 weeks. Declining breath test at this time.  Chronic GERD.  Alternating constipation and diarrhea. Due to palpitations reported at last visit, I referred her to cardiology. She will be wearing a monitor and completing an ECHO in the future.  CT scan completed after last visit with small fat-containing supraumbilical hernia. Rectus diastasis without obvious hernia and mesh in place. Noncontrast CT due in March 2022 due to pulmonary nodule.   Started taking Metamucil, which has helped to even out her bowel movements. Has at least 1 bm every day. No rectal bleeding. Has sharp pain just prior to BM but resolved after BM. States it just reminds her when she needs to go.   GERD: trying not to eat after 6pm. Prilosec once daily. Symptoms much improved. Now taking 30 minutes before breakfast instead of with food.   Overall, she feels much improved and relieved after the CT scan, as she was worried about a cancer due to the bulging in her abdomen.   History of colon polyps with surveillance colonoscopy due.    Past Medical History:  Diagnosis Date  . Anemia   . Anxiety   . Arthritis   . Asthma   . Bursitis   . Chronic back pain   . Chronic constipation   . Depression   . Diastasis recti   . Essential hypertension   . GERD (gastroesophageal reflux disease)   . Helicobacter pylori  gastritis    Treated in May 2011  . Hyperlipidemia   . Obesity   . S/P colonoscopy 12/2009   Multiple tubular adenomas: next TCS May 2014  . S/P endoscopy 12/2009   Severe erosive reflux esophagitis, noncritial schatzki's ring, +h.pylori  . Seasonal allergies   . Sleep apnea   . Type II diabetes mellitus ([place])   . Umbilical hernia     Past Surgical History:  Procedure Laterality Date  . CHOLECYSTECTOMY  1999  . COLONOSCOPY  May 2011   Dr. Gala Romney: long, torturous colon, multiple colonic polyps. TUBULAR ADENOMAS  . COLONOSCOPY N/A 04/15/2013   normal rectum, torturous colon.  tubular adenomas. Surveillance due 2019.   Marland Kitchen DILATION AND CURETTAGE OF UTERUS  1970's  . ESOPHAGOGASTRODUODENOSCOPY  May 2011   Dr. Gala Romney: severe erosive reflux esophagitis, non-critical Schatzki's ring, diffuse gastric erosions. H. PYLORI GASTRITIS  . HERNIA REPAIR  05/04/2013   laparoscopic incisional hernia repair w/mesh per notes(05/04/2013)  . INCISIONAL HERNIA REPAIR N/A 05/04/2013   Procedure: LAPAROSCOPIC INCISIONAL HERNIA ;  Surgeon: Adin Hector, MD;  Location: [facility];  Service: General;  Laterality: N/A;  . INSERTION OF MESH N/A 05/04/2013   Procedure: INSERTION OF MESH;  Surgeon: Adin Hector, MD;  Location: [facility];  Service: General;  Laterality: N/A;  . TUBAL LIGATION  1970's    Current  Outpatient Medications  Medication Sig Dispense Refill  . acetaminophen (TYLENOL) 500 MG tablet Take 500-1,000 mg by mouth every 6 (six) hours as needed.    Marland Kitchen amLODipine (NORVASC) 10 MG tablet Take 10 mg by mouth daily with breakfast.     . aspirin 81 MG tablet Take 81 mg by mouth daily.      . hydrochlorothiazide (HYDRODIURIL) 25 MG tablet Take 25 mg by mouth daily.    Marland Kitchen loratadine (CLARITIN) 10 MG tablet Take 10 mg by mouth daily.      Marland Kitchen losartan (COZAAR) 100 MG tablet Take 1 tablet (100 mg total) by mouth once. (Patient taking differently: Take 100 mg by mouth daily. ) 30 tablet 0  . magnesium oxide (MAG-OX)  400 MG tablet Take 1 tablet by mouth daily.    . metFORMIN (GLUCOPHAGE) 1000 MG tablet Take 1,000 mg by mouth daily. At night    . omeprazole (PRILOSEC) 20 MG capsule Take 20 mg by mouth daily with breakfast.     . polyethylene glycol (MIRALAX / GLYCOLAX) packet Take 17 g by mouth daily as needed (for constipation). 30 packet 5  . simvastatin (ZOCOR) 20 MG tablet Take 20 mg by mouth daily.    Marland Kitchen spironolactone (ALDACTONE) 25 MG tablet Take 25 mg by mouth 2 (two) times daily.    . TRADJENTA 5 MG TABS tablet Take 5 mg by mouth daily.    . Vitamin D, Ergocalciferol, (DRISDOL) 1.25 MG (50000 UNIT) CAPS capsule Take 50,000 Units by mouth every 30 (thirty) days.     No current facility-administered medications for this visit.    Allergies as of 12/14/2019  . (No Known Allergies)    Family History  Problem Relation Age of Onset  . Colon cancer Maternal Uncle   . Colon cancer Paternal Uncle   . COPD Mother   . Hypertension Mother   . Seizures Mother   . Chronic Renal Failure Mother   . Heart disease Father   . Diabetes Sister   . Hypertension Sister   . Heart disease Sister   . Diabetes Brother   . Hypertension Brother   . Hypertension Daughter   . Hypertension Son   . Diabetes Sister   . Hypertension Sister   . Diabetes Sister   . Hypertension Sister     Social History   Socioeconomic History  . Marital status: Single    Spouse name: Not on file  . Number of children: Not on file  . Years of education: Not on file  . Highest education level: Not on file  Occupational History  . Occupation: retired  Tobacco Use  . Smoking status: Never Smoker  . Smokeless tobacco: Never Used  Substance and Sexual Activity  . Alcohol use: No  . Drug use: No  . Sexual activity: Never    Birth control/protection: Post-menopausal  Other Topics Concern  . Not on file  Social History Narrative  . Not on file   Social Determinants of Health   Financial Resource Strain:   . Difficulty  of Paying Living Expenses:   Food Insecurity:   . Worried About Charity fundraiser in the Last Year:   . Arboriculturist in the Last Year:   Transportation Needs:   . Film/video editor (Medical):   Marland Kitchen Lack of Transportation (Non-Medical):   Physical Activity:   . Days of Exercise per Week:   . Minutes of Exercise per Session:   Stress:   .  Feeling of Stress :   Social Connections:   . Frequency of Communication with Friends and Family:   . Frequency of Social Gatherings with Friends and Family:   . Attends Religious Services:   . Active Member of Clubs or Organizations:   . Attends Archivist Meetings:   Marland Kitchen Marital Status:     Review of Systems: Gen: Denies fever, chills, anorexia. Denies fatigue, weakness, weight loss.  CV: Denies chest pain, palpitations, syncope, peripheral edema, and claudication. Resp: Denies dyspnea at rest, cough, wheezing, coughing up blood, and pleurisy. GI: see HPI Derm: Denies rash, itching, dry skin Psych: Denies depression, anxiety, memory loss, confusion. No homicidal or suicidal ideation.  Heme: Denies bruising, bleeding, and enlarged lymph nodes.  Physical Exam: BP 133/79   Pulse 94   Temp (!) 96.9 F (36.1 C) (Oral)   Ht 5\' 4"  (1.626 m)   Wt 217 lb 12.8 oz (98.8 kg)   BMI 37.39 kg/m  General:   Alert and oriented. No distress noted. Pleasant and cooperative.  Head:  Normocephalic and atraumatic. Eyes:  Conjuctiva clear without scleral icterus. Mouth:  Mask in place Abdomen:  +BS, soft, round but non-distended. Small supraumbilical hernia. Rectus diastasis. Msk:  Symmetrical without gross deformities. Normal posture. Extremities:  Without edema. Neurologic:  Alert and  oriented x4 Psych:  Alert and cooperative. Normal mood and affect.  ASSESSMENT: SHIRA PADUANO is a 64 y.o. female with history of GERD, alternating bowel habits, remote history of H.pylori, and colon polyps, returning in close follow-up after CT.   CT  completed: no recurrence of hernia but does have rectus diastasis. Small fat-containing supraumbilical hernia asymptomatic.   GERD: doing well with Prilosec once daily and dietary/behavior modification. Much improved fro last visit.   Alternating bowel habits: improved with Metamucil once daily. No rectal bleeding.  Remote history of H.pylori in 2011 s/p treatment: no eradication on file. She is unable to come off PPI for 2 weeks and declining breath test.  History of colon polyps: last colonoscopy in 2014. After visit this was determined when reviewing chart, and she is actually overdue now. Was lost to follow-up in past. Will reach out to her regarding colonoscopy in near future for surveillance purposes.    Palpitations: wearing heart monitor and ECHO to be completed. Keep follow-up with cardiology.    PLAN:    Will reach out to patient regarding surveillance colonoscopy due to history of polyps: I contacted patient after the appointment and reviewed risks and benefits with stated understanding. She desires to proceed.   Proceed with TCS with Dr. Gala Romney in near future: the risks, benefits, and alternatives have been discussed with the patient in detail. The patient states understanding and desires to proceed.   Continue Prilosec once daily, diet/behavior modifications  Continue Metamucil daily  Return in 1 year or sooner if needed.   Annitta Needs, PhD, ANP-BC [facility]

## 2019-12-14 ENCOUNTER — Telehealth: Payer: Self-pay | Admitting: Gastroenterology

## 2019-12-14 ENCOUNTER — Encounter: Payer: Self-pay | Admitting: Gastroenterology

## 2019-12-14 ENCOUNTER — Other Ambulatory Visit: Payer: Self-pay

## 2019-12-14 ENCOUNTER — Ambulatory Visit: Payer: Medicare Other | Admitting: Gastroenterology

## 2019-12-14 VITALS — BP 133/79 | HR 94 | Temp 96.9°F | Ht 64.0 in | Wt 217.8 lb

## 2019-12-14 DIAGNOSIS — K219 Gastro-esophageal reflux disease without esophagitis: Secondary | ICD-10-CM | POA: Diagnosis not present

## 2019-12-14 DIAGNOSIS — K5900 Constipation, unspecified: Secondary | ICD-10-CM

## 2019-12-14 NOTE — Telephone Encounter (Signed)
Called pt and she said that she is interested in pursuing a surveillance colonoscopy.

## 2019-12-14 NOTE — Telephone Encounter (Signed)
Can you please call patient and let her know that she is actually due for surveillance colonoscopy? (Was due in 2019). I realized this while finishing her note after her appt. Is she willing to pursue this?

## 2019-12-14 NOTE — Patient Instructions (Signed)
Continue Metamucil as you are doing.  Continue omeprazole once daily. I am proud of you for all the healthy choices you are making!  Please call if any concerns. Otherwise, we will see you back in 1 year!  I enjoyed seeing you again today! As you know, I value our relationship and want to provide genuine, compassionate, and quality care. I welcome your feedback. If you receive a survey regarding your visit,  I greatly appreciate you taking time to fill this out. See you next time!  Annitta Needs, PhD, ANP-BC [facility]

## 2019-12-15 NOTE — Telephone Encounter (Signed)
Called pt, TCS w/RMR scheduled for 03/08/20 at 9:30am. Orders entered.

## 2019-12-15 NOTE — Telephone Encounter (Signed)
[facility] clinical pool:  Please arrange colonoscopy due to history of polyps. No Propofol needed.   Needs to hold metformin and Tradjenta the day of the procedure.

## 2019-12-21 ENCOUNTER — Other Ambulatory Visit: Payer: Self-pay

## 2019-12-22 ENCOUNTER — Ambulatory Visit: Payer: Medicare Other | Admitting: Cardiology

## 2019-12-22 DIAGNOSIS — I10 Essential (primary) hypertension: Secondary | ICD-10-CM | POA: Diagnosis not present

## 2019-12-22 DIAGNOSIS — G4733 Obstructive sleep apnea (adult) (pediatric): Secondary | ICD-10-CM | POA: Diagnosis not present

## 2019-12-22 DIAGNOSIS — E1140 Type 2 diabetes mellitus with diabetic neuropathy, unspecified: Secondary | ICD-10-CM | POA: Diagnosis not present

## 2019-12-22 DIAGNOSIS — R5383 Other fatigue: Secondary | ICD-10-CM | POA: Diagnosis not present

## 2019-12-24 NOTE — Telephone Encounter (Signed)
Endo scheduler advised COVID test can't be scheduled prior to procedure at this time d/t testing site hours (site will be closed 03/06/20 d/t holiday). Letter mailed with procedure instructions.

## 2019-12-27 ENCOUNTER — Other Ambulatory Visit: Payer: Self-pay

## 2019-12-27 ENCOUNTER — Ambulatory Visit (HOSPITAL_COMMUNITY)
Admission: RE | Admit: 2019-12-27 | Discharge: 2019-12-27 | Disposition: A | Discharge status: Home / Self Care | Payer: Medicare Other | Source: Ambulatory Visit | Attending: Cardiology | Admitting: Cardiology

## 2019-12-27 DIAGNOSIS — R0602 Shortness of breath: Secondary | ICD-10-CM | POA: Insufficient documentation

## 2019-12-27 NOTE — Progress Notes (Signed)
*  PRELIMINARY RESULTS* Echocardiogram 2D Echocardiogram has been performed.  Mary Robinson 12/27/2019, 3:53 PM

## 2020-01-04 NOTE — Telephone Encounter (Signed)
COVID testing site hours changed. COVID test scheduled for 03/07/20 at 9:00am. Appt letter mailed to pt.

## 2020-01-05 DIAGNOSIS — K219 Gastro-esophageal reflux disease without esophagitis: Secondary | ICD-10-CM | POA: Diagnosis not present

## 2020-01-05 DIAGNOSIS — E1165 Type 2 diabetes mellitus with hyperglycemia: Secondary | ICD-10-CM | POA: Diagnosis not present

## 2020-02-05 DIAGNOSIS — K219 Gastro-esophageal reflux disease without esophagitis: Secondary | ICD-10-CM | POA: Diagnosis not present

## 2020-02-05 DIAGNOSIS — E1165 Type 2 diabetes mellitus with hyperglycemia: Secondary | ICD-10-CM | POA: Diagnosis not present

## 2020-03-06 DIAGNOSIS — E1165 Type 2 diabetes mellitus with hyperglycemia: Secondary | ICD-10-CM | POA: Diagnosis not present

## 2020-03-06 DIAGNOSIS — I10 Essential (primary) hypertension: Secondary | ICD-10-CM | POA: Diagnosis not present

## 2020-03-06 DIAGNOSIS — J4530 Mild persistent asthma, uncomplicated: Secondary | ICD-10-CM | POA: Diagnosis not present

## 2020-03-07 ENCOUNTER — Other Ambulatory Visit: Payer: Self-pay

## 2020-03-07 ENCOUNTER — Other Ambulatory Visit (HOSPITAL_COMMUNITY)
Admission: RE | Admit: 2020-03-07 | Discharge: 2020-03-07 | Disposition: A | Discharge status: Home / Self Care | Payer: Medicare Other | Source: Ambulatory Visit | Attending: Internal Medicine | Admitting: Internal Medicine

## 2020-03-07 DIAGNOSIS — Z01812 Encounter for preprocedural laboratory examination: Secondary | ICD-10-CM | POA: Insufficient documentation

## 2020-03-07 DIAGNOSIS — E1165 Type 2 diabetes mellitus with hyperglycemia: Secondary | ICD-10-CM | POA: Diagnosis not present

## 2020-03-07 DIAGNOSIS — I10 Essential (primary) hypertension: Secondary | ICD-10-CM | POA: Diagnosis not present

## 2020-03-07 DIAGNOSIS — Z20822 Contact with and (suspected) exposure to covid-19: Secondary | ICD-10-CM | POA: Diagnosis not present

## 2020-03-07 LAB — SARS CORONAVIRUS 2 (TAT 6-24 HRS): SARS Coronavirus 2: NEGATIVE

## 2020-03-08 ENCOUNTER — Encounter (HOSPITAL_COMMUNITY)
Admission: RE | Disposition: A | Discharge status: Home / Self Care | Payer: Self-pay | Source: Home / Self Care | Attending: Internal Medicine

## 2020-03-08 ENCOUNTER — Telehealth: Payer: Self-pay

## 2020-03-08 ENCOUNTER — Ambulatory Visit (HOSPITAL_COMMUNITY)
Admission: RE | Admit: 2020-03-08 | Discharge: 2020-03-08 | Disposition: A | Discharge status: Home / Self Care | Payer: Medicare Other | Attending: Internal Medicine | Admitting: Internal Medicine

## 2020-03-08 ENCOUNTER — Encounter: Payer: Self-pay | Admitting: Internal Medicine

## 2020-03-08 ENCOUNTER — Telehealth: Payer: Self-pay | Admitting: Internal Medicine

## 2020-03-08 DIAGNOSIS — Z86010 Personal history of colonic polyps: Secondary | ICD-10-CM | POA: Diagnosis not present

## 2020-03-08 DIAGNOSIS — Z539 Procedure and treatment not carried out, unspecified reason: Secondary | ICD-10-CM | POA: Diagnosis not present

## 2020-03-08 DIAGNOSIS — Z1211 Encounter for screening for malignant neoplasm of colon: Secondary | ICD-10-CM | POA: Diagnosis not present

## 2020-03-08 SURGERY — COLONOSCOPY
Anesthesia: Moderate Sedation

## 2020-03-08 NOTE — Telephone Encounter (Signed)
Noted. FYI Routing to [place]. Previous message sent .

## 2020-03-08 NOTE — Telephone Encounter (Signed)
Pt was scheduled with RMR this morning 03/08/20 (TCS). Procedure will be cancelled due to poor prep. RMR wants to re-evaluate Miralax Prep. Please schedule face to face ov to r/s TCS.

## 2020-03-08 NOTE — Telephone Encounter (Signed)
Scheduled patient and sent letter  

## 2020-03-08 NOTE — Telephone Encounter (Signed)
Noted  

## 2020-03-08 NOTE — Telephone Encounter (Signed)
Patient called me last night around 2000 hrs.  Finished her entire prep no bowel movement.  I advised her to take 4 more doses of MiraLAX 17 g every 1 hour x4 hours. Came to the Endo unit today.  Nurse reported she was still having some brown stool with form.  Advised to cancel the procedure return to the office and regroup.  She will need to more substantial prep at's next attempt.

## 2020-04-06 DIAGNOSIS — K219 Gastro-esophageal reflux disease without esophagitis: Secondary | ICD-10-CM | POA: Diagnosis not present

## 2020-04-06 DIAGNOSIS — E1165 Type 2 diabetes mellitus with hyperglycemia: Secondary | ICD-10-CM | POA: Diagnosis not present

## 2020-04-27 ENCOUNTER — Ambulatory Visit: Payer: Medicare Other | Admitting: Nurse Practitioner

## 2020-05-08 DIAGNOSIS — E1165 Type 2 diabetes mellitus with hyperglycemia: Secondary | ICD-10-CM | POA: Diagnosis not present

## 2020-05-08 DIAGNOSIS — K219 Gastro-esophageal reflux disease without esophagitis: Secondary | ICD-10-CM | POA: Diagnosis not present

## 2020-06-07 DIAGNOSIS — E1165 Type 2 diabetes mellitus with hyperglycemia: Secondary | ICD-10-CM | POA: Diagnosis not present

## 2020-06-07 DIAGNOSIS — K219 Gastro-esophageal reflux disease without esophagitis: Secondary | ICD-10-CM | POA: Diagnosis not present

## 2020-06-12 DIAGNOSIS — Z23 Encounter for immunization: Secondary | ICD-10-CM | POA: Diagnosis not present

## 2020-07-08 DIAGNOSIS — E1165 Type 2 diabetes mellitus with hyperglycemia: Secondary | ICD-10-CM | POA: Diagnosis not present

## 2020-07-08 DIAGNOSIS — K219 Gastro-esophageal reflux disease without esophagitis: Secondary | ICD-10-CM | POA: Diagnosis not present

## 2020-07-25 DIAGNOSIS — H2513 Age-related nuclear cataract, bilateral: Secondary | ICD-10-CM | POA: Diagnosis not present

## 2020-07-25 DIAGNOSIS — E119 Type 2 diabetes mellitus without complications: Secondary | ICD-10-CM | POA: Diagnosis not present

## 2020-08-15 DIAGNOSIS — E1165 Type 2 diabetes mellitus with hyperglycemia: Secondary | ICD-10-CM | POA: Diagnosis not present

## 2020-08-15 DIAGNOSIS — E785 Hyperlipidemia, unspecified: Secondary | ICD-10-CM | POA: Diagnosis not present

## 2020-08-15 DIAGNOSIS — I10 Essential (primary) hypertension: Secondary | ICD-10-CM | POA: Diagnosis not present

## 2020-09-15 DIAGNOSIS — I10 Essential (primary) hypertension: Secondary | ICD-10-CM | POA: Diagnosis not present

## 2020-09-15 DIAGNOSIS — E1165 Type 2 diabetes mellitus with hyperglycemia: Secondary | ICD-10-CM | POA: Diagnosis not present

## 2020-10-10 DIAGNOSIS — Z1389 Encounter for screening for other disorder: Secondary | ICD-10-CM | POA: Diagnosis not present

## 2020-10-10 DIAGNOSIS — E785 Hyperlipidemia, unspecified: Secondary | ICD-10-CM | POA: Diagnosis not present

## 2020-10-10 DIAGNOSIS — E1165 Type 2 diabetes mellitus with hyperglycemia: Secondary | ICD-10-CM | POA: Diagnosis not present

## 2020-10-10 DIAGNOSIS — Z0001 Encounter for general adult medical examination with abnormal findings: Secondary | ICD-10-CM | POA: Diagnosis not present

## 2020-10-10 DIAGNOSIS — I10 Essential (primary) hypertension: Secondary | ICD-10-CM | POA: Diagnosis not present

## 2020-10-10 LAB — LIPID PANEL
Cholesterol: 139 (ref 0–200)
HDL: 50 (ref 35–70)
LDL Cholesterol: 63
Triglycerides: 190 — AB (ref 40–160)

## 2020-10-10 LAB — COMPREHENSIVE METABOLIC PANEL
GFR calc Af Amer: 69
GFR calc non Af Amer: 59

## 2020-10-10 LAB — BASIC METABOLIC PANEL
BUN: 20 (ref 4–21)
Creatinine: 1 (ref 0.5–1.1)

## 2020-10-10 LAB — VITAMIN D 25 HYDROXY (VIT D DEFICIENCY, FRACTURES): Vit D, 25-Hydroxy: 61

## 2020-10-10 LAB — HEMOGLOBIN A1C: Hemoglobin A1C: 9.3

## 2020-11-01 ENCOUNTER — Encounter: Payer: Self-pay | Admitting: Internal Medicine

## 2020-11-07 DIAGNOSIS — I10 Essential (primary) hypertension: Secondary | ICD-10-CM | POA: Diagnosis not present

## 2020-11-07 DIAGNOSIS — E1165 Type 2 diabetes mellitus with hyperglycemia: Secondary | ICD-10-CM | POA: Diagnosis not present

## 2020-11-23 ENCOUNTER — Other Ambulatory Visit: Payer: Self-pay

## 2020-11-23 ENCOUNTER — Ambulatory Visit: Payer: Medicare Other | Admitting: "Endocrinology

## 2020-11-23 ENCOUNTER — Encounter: Payer: Self-pay | Admitting: "Endocrinology

## 2020-11-23 VITALS — BP 124/58 | HR 88 | Ht 64.0 in | Wt 228.0 lb

## 2020-11-23 DIAGNOSIS — E782 Mixed hyperlipidemia: Secondary | ICD-10-CM

## 2020-11-23 DIAGNOSIS — I10 Essential (primary) hypertension: Secondary | ICD-10-CM

## 2020-11-23 DIAGNOSIS — E1165 Type 2 diabetes mellitus with hyperglycemia: Secondary | ICD-10-CM | POA: Insufficient documentation

## 2020-11-23 MED ORDER — RYBELSUS 3 MG PO TABS: 3.0000 mg | ORAL_TABLET | Freq: Every day | ORAL | 1 refills | Status: DC

## 2020-11-23 NOTE — Progress Notes (Signed)
Endocrinology Consult Note       11/23/2020, 12:21 PM   Subjective:    Patient ID: Mary Robinson, female    DOB: Feb 01, 1956.  Mary Robinson is being seen in consultation for management of currently uncontrolled symptomatic diabetes requested by  Rosita Fire, MD.   Past Medical History:  Diagnosis Date   Anemia    Anxiety    Arthritis    Asthma    Bursitis    Chronic back pain    Chronic constipation    Depression    Diastasis recti    Essential hypertension    GERD (gastroesophageal reflux disease)    Helicobacter pylori gastritis    Treated in May 2011   Hyperlipidemia    Obesity    S/P colonoscopy 12/2009   Multiple tubular adenomas: next TCS May 2014   S/P endoscopy 12/2009   Severe erosive reflux esophagitis, noncritial schatzki's ring, +h.pylori   Seasonal allergies    Sleep apnea    Type II diabetes mellitus ([place])    Umbilical hernia     Past Surgical History:  Procedure Laterality Date   CHOLECYSTECTOMY  1999   COLONOSCOPY  May 2011   Dr. Gala Romney: long, torturous colon, multiple colonic polyps. TUBULAR ADENOMAS   COLONOSCOPY N/A 04/15/2013   normal rectum, torturous colon.  tubular adenomas. Surveillance due 2019.    DILATION AND CURETTAGE OF UTERUS  1970's   ESOPHAGOGASTRODUODENOSCOPY  May 2011   Dr. Gala Romney: severe erosive reflux esophagitis, non-critical Schatzki's ring, diffuse gastric erosions. H. PYLORI GASTRITIS   HERNIA REPAIR  05/04/2013   laparoscopic incisional hernia repair w/mesh per notes(05/04/2013)   INCISIONAL HERNIA REPAIR N/A 05/04/2013   Procedure: LAPAROSCOPIC INCISIONAL HERNIA ;  Surgeon: Adin Hector, MD;  Location: [facility];  Service: General;  Laterality: N/A;   INSERTION OF MESH N/A 05/04/2013   Procedure: INSERTION OF MESH;  Surgeon: Adin Hector, MD;  Location: [facility];  Service: General;  Laterality: N/A;   TUBAL LIGATION   1970's    Social History   Socioeconomic History   Marital status: Single    Spouse name: Not on file   Number of children: Not on file   Years of education: Not on file   Highest education level: Not on file  Occupational History   Occupation: retired  Tobacco Use   Smoking status: Never Smoker   Smokeless tobacco: Never Used  Scientific laboratory technician Use: Never used  Substance and Sexual Activity   Alcohol use: No   Drug use: No   Sexual activity: Never    Birth control/protection: Post-menopausal  Other Topics Concern   Not on file  Social History Narrative   Not on file   Social Determinants of Health   Financial Resource Strain: Not on file  Food Insecurity: Not on file  Transportation Needs: Not on file  Physical Activity: Not on file  Stress: Not on file  Social Connections: Not on file    Family History  Problem Relation Age of Onset   Colon cancer Maternal Uncle    Colon cancer Paternal Uncle  COPD Mother    Hypertension Mother    Seizures Mother    Chronic Renal Failure Mother    Hyperlipidemia Mother    Stroke Mother    Kidney disease Mother    Heart failure Mother    Heart disease Father    Diabetes Sister    Hypertension Sister    Heart disease Sister    Diabetes Brother    Hypertension Brother    Hypertension Daughter    Hypertension Son    Diabetes Sister    Hypertension Sister    Diabetes Sister    Hypertension Sister     Outpatient Encounter Medications as of 11/23/2020  Medication Sig   Albuterol Sulfate, sensor, (PROAIR DIGIHALER) 108 (90 Base) MCG/ACT AEPB Inhale into the lungs as needed.   Semaglutide (RYBELSUS) 3 MG TABS Take 3 mg by mouth daily before breakfast.   acetaminophen (TYLENOL) 500 MG tablet Take 500-1,000 mg by mouth every 6 (six) hours as needed for moderate pain.    amLODipine (NORVASC) 10 MG tablet Take 10 mg by mouth daily with breakfast.    aspirin 81 MG tablet Take 81 mg  by mouth daily.     hydrochlorothiazide (HYDRODIURIL) 25 MG tablet Take 25 mg by mouth daily.   loratadine (CLARITIN) 10 MG tablet Take 10 mg by mouth daily.     losartan (COZAAR) 100 MG tablet Take 1 tablet (100 mg total) by mouth once. (Patient taking differently: Take 100 mg by mouth daily. )   magnesium oxide (MAG-OX) 400 MG tablet Take 400 mg by mouth daily.    metFORMIN (GLUCOPHAGE) 1000 MG tablet Take 1,000 mg by mouth 2 (two) times daily.   omeprazole (PRILOSEC) 20 MG capsule Take 20 mg by mouth daily with breakfast.    polyethylene glycol (MIRALAX / GLYCOLAX) packet Take 17 g by mouth daily as needed (for constipation).   simvastatin (ZOCOR) 20 MG tablet Take 20 mg by mouth daily.   spironolactone (ALDACTONE) 25 MG tablet Take 50 mg by mouth once.   Vitamin D, Ergocalciferol, (DRISDOL) 1.25 MG (50000 UNIT) CAPS capsule Take 50,000 Units by mouth every 30 (thirty) days.   [DISCONTINUED] metFORMIN (GLUCOPHAGE) 850 MG tablet Take 850 mg by mouth daily. At night   [DISCONTINUED] TRADJENTA 5 MG TABS tablet Take 5 mg by mouth daily.   No facility-administered encounter medications on file as of 11/23/2020.    ALLERGIES: No Known Allergies  VACCINATION STATUS: Immunization History  Administered Date(s) Administered   Influenza-Unspecified 06/29/2014, 07/03/2018    Diabetes She presents for her initial diabetic visit. She has type 2 diabetes mellitus. Her disease course has been worsening. There are no hypoglycemic associated symptoms. Pertinent negatives for hypoglycemia include no confusion, headaches, pallor or seizures. Associated symptoms include fatigue, polydipsia and polyuria. Pertinent negatives for diabetes include no chest pain and no polyphagia. There are no hypoglycemic complications. Symptoms are worsening. Risk factors for coronary artery disease include dyslipidemia, diabetes mellitus, family history, obesity, hypertension, sedentary lifestyle and  post-menopausal. Current diabetic treatments: She is currently on Metformin 1000 mg p.o. twice daily, Tradjenta 5 mg p.o. daily. Her weight is increasing steadily. She is following a generally unhealthy diet. When asked about meal planning, she reported none. She has not had a previous visit with a dietitian. She rarely participates in exercise. Her home blood glucose trend is decreasing steadily. (She did not bring any logs nor meter with her.  She reports that her fasting glycemic profile is getting better after she increase  her Metformin to twice a day from once a day.) An ACE inhibitor/angiotensin II receptor blocker is being taken. She does not see a podiatrist.Eye exam is current.  Hyperlipidemia This is a chronic problem. The current episode started more than 1 year ago. The problem is uncontrolled. Exacerbating diseases include diabetes and obesity. Pertinent negatives include no chest pain, myalgias or shortness of breath. Current antihyperlipidemic treatment includes statins. Risk factors for coronary artery disease include diabetes mellitus, dyslipidemia, hypertension, a sedentary lifestyle, post-menopausal, obesity and family history.  Hypertension This is a chronic problem. The current episode started more than 1 year ago. The problem is controlled. Pertinent negatives include no chest pain, headaches, palpitations or shortness of breath. Risk factors for coronary artery disease include dyslipidemia, diabetes mellitus, obesity, sedentary lifestyle, family history and post-menopausal state. Past treatments include angiotensin blockers and calcium channel blockers.     Review of Systems  Constitutional: Positive for fatigue. Negative for chills, fever and unexpected weight change.  HENT: Negative for trouble swallowing and voice change.   Eyes: Negative for visual disturbance.  Respiratory: Negative for cough, shortness of breath and wheezing.   Cardiovascular: Negative for chest pain,  palpitations and leg swelling.  Gastrointestinal: Negative for diarrhea, nausea and vomiting.  Endocrine: Positive for polydipsia and polyuria. Negative for cold intolerance, heat intolerance and polyphagia.  Musculoskeletal: Negative for arthralgias and myalgias.  Skin: Negative for color change, pallor, rash and wound.  Neurological: Negative for seizures and headaches.  Psychiatric/Behavioral: Negative for confusion and suicidal ideas.    Objective:    Vitals with BMI 11/23/2020 12/14/2019 12/09/2019  Height 5\' 4"  5\' 4"  5\' 4"   Weight 228 lbs 217 lbs 13 oz 220 lbs  BMI 39.12 36.64 40.34  Systolic 742 595 638  Diastolic 58 79 88  Pulse 88 94 102    BP (!) 124/58    Pulse 88    Ht 5\' 4"  (1.626 m)    Wt 228 lb (103.4 kg)    BMI 39.14 kg/m   Wt Readings from Last 3 Encounters:  11/23/20 228 lb (103.4 kg)  12/14/19 217 lb 12.8 oz (98.8 kg)  12/09/19 220 lb (99.8 kg)     Physical Exam Constitutional:      Appearance: She is well-developed.  HENT:     Head: Normocephalic and atraumatic.  Neck:     Thyroid: No thyromegaly.     Trachea: No tracheal deviation.  Cardiovascular:     Rate and Rhythm: Normal rate.  Pulmonary:     Effort: Pulmonary effort is normal.  Abdominal:     General: Bowel sounds are normal.     Palpations: Abdomen is soft.     Tenderness: There is no abdominal tenderness. There is no guarding.  Musculoskeletal:        General: Normal range of motion.     Cervical back: Normal range of motion and neck supple.  Skin:    General: Skin is warm and dry.     Coloration: Skin is not pale.     Findings: No erythema or rash.  Neurological:     General: No focal deficit present.     Mental Status: She is alert and oriented to person, place, and time.     Cranial Nerves: No cranial nerve deficit.     Coordination: Coordination normal.     Deep Tendon Reflexes: Reflexes are normal and symmetric.     Comments: Normal monofilament sensation test on bilateral lower  extremities.  Psychiatric:        Judgment: Judgment normal.       CMP ( most recent) CMP     Component Value Date/Time   NA 138 11/09/2019 1020   K 4.6 11/09/2019 1020   CL 101 11/09/2019 1020   CO2 29 11/09/2019 1020   GLUCOSE 152 (H) 11/09/2019 1020   BUN 20 10/10/2020 0000   CREATININE 1.0 10/10/2020 0000   CREATININE 1.05 (H) 11/09/2019 1020   CALCIUM 9.6 11/09/2019 1020   PROT 7.9 04/30/2013 1025   ALBUMIN 3.8 04/30/2013 1025   AST 34 04/30/2013 1025   ALT 38 (H) 04/30/2013 1025   ALKPHOS 72 04/30/2013 1025   BILITOT 0.2 (L) 04/30/2013 1025   GFRNONAA 59 10/10/2020 0000   GFRNONAA 55 (L) 11/09/2019 1020   GFRAA 69 10/10/2020 0000   GFRAA 64 11/09/2019 1020     Diabetic Labs (most recent): Lab Results  Component Value Date   HGBA1C 9.3 10/10/2020   HGBA1C 7.9 (H) 05/04/2013     Lipid Panel ( most recent) Lipid Panel     Component Value Date/Time   CHOL 139 10/10/2020 0000   TRIG 190 (A) 10/10/2020 0000   HDL 50 10/10/2020 0000   LDLCALC 63 10/10/2020 0000      Assessment & Plan:   1. Uncontrolled type 2 diabetes mellitus with hyperglycemia ([place]) - SUMIYA MAMARIL has currently uncontrolled symptomatic type 2 DM since  65 years of age,  with most recent A1c of 9.3 %. Recent labs reviewed. - I had a long discussion with her about the progressive nature of diabetes and the pathology behind its complications. -her diabetes is complicated by obesity/sedentary life and she remains at a high risk for more acute and chronic complications which include CAD, CVA, CKD, retinopathy, and neuropathy. These are all discussed in detail with her.  - I have counseled her on diet  and weight management  by adopting a carbohydrate restricted/protein rich diet. Patient is encouraged to switch to  unprocessed or minimally processed     complex starch and increased protein intake (animal or plant source), fruits, and vegetables. -  she is advised to stick to a routine  mealtimes to eat 3 meals  a day and avoid unnecessary snacks ( to snack only to correct hypoglycemia).   - she acknowledges that there is a room for improvement in her food and drink choices. - Suggestion is made for her to avoid simple carbohydrates  from her diet including Cakes, Sweet Desserts, Ice Cream, Soda (diet and regular), Sweet Tea, Candies, Chips, Cookies, Store Bought Juices, Alcohol in Excess of  1-2 drinks a day, Artificial Sweeteners,  Coffee Creamer, and "Sugar-free" Products. This will help patient to have more stable blood glucose profile and potentially avoid unintended weight gain.  - she will be scheduled with Jearld Fenton, RDN, CDE for diabetes education.  - I have approached her with the following individualized plan to manage  her diabetes and patient agrees:   - she will like to avoid injectable treatment.  -Reportedly, she is improving her glycemic profile after she increased her Metformin to twice a day.  She is approached to start monitoring blood glucose twice a day-daily before breakfast and before bedtime.  - she is encouraged to call clinic for blood glucose levels less than 70 or above 200 mg per DL at fasting . - she is advised to continue Metformin 1000 mg p.o. twice daily, allowed to finish her current supplies  of Tradjenta 5 mg p.o. daily.  After that, she will be starting Rybelsus 3 mg p.o. daily before breakfast, to be increased if she tolerates it during her next visit. Rybelsus will give her more weight loss advantage over Tradjenta.  - Specific targets for  A1c;  LDL, HDL,  and Triglycerides were discussed with the patient.  2) Blood Pressure /Hypertension:  her blood pressure is  controlled to target.   she is advised to continue her current medications including amlodipine 10 mg p.o. daily, losartan 100 mg p.o. daily at breakfast.   3) Lipids/Hyperlipidemia:   Review of her recent lipid panel showed  controlled  LDL at 63 .  she  is advised to  continue   simvastatin 20 mg daily at bedtime.  Side effects and precautions discussed with her.  4)  Weight/Diet:  Body mass index is 39.14 kg/m.  -   clearly complicating her diabetes care.   she is  a candidate for weight loss. I discussed with her the fact that loss of 5 - 10% of her  current body weight will have the most impact on her diabetes management.  Exercise, and detailed carbohydrates information provided  -  detailed on discharge instructions.  5) Chronic Care/Health Maintenance:  -she   on ACEI/ARB and Statin medications and  is encouraged to initiate and continue to follow up with Ophthalmology, Dentist,  Podiatrist at least yearly or according to recommendations, and advised to   stay away from smoking. I have recommended yearly flu vaccine and pneumonia vaccine at least every 5 years; moderate intensity exercise for up to 150 minutes weekly; and  sleep for at least 7 hours a day.  - she is  advised to maintain close follow up with Rosita Fire, MD for primary care needs, as well as her other providers for optimal and coordinated care.   - Time spent in this patient care: 65 min, of which > 50% was spent in  counseling  her about her currently uncontrolled type 2 diabetes, hyperlipidemia, hypertension, obesity and the rest reviewing her blood glucose logs , discussing her hypoglycemia and hyperglycemia episodes, reviewing her current and  previous labs / studies  ( including abstraction from other facilities) and medications  doses and developing a  long term treatment plan based on the latest standards of care/ guidelines; and documenting her care.    Please refer to Patient Instructions for Blood Glucose Monitoring and Insulin/Medications Dosing Guide"  in media tab for additional information. Please  also refer to " Patient Self Inventory" in the Media  tab for reviewed elements of pertinent patient history.  Everrett Coombe participated in the discussions, expressed  understanding, and voiced agreement with the above plans.  All questions were answered to her satisfaction. she is encouraged to contact clinic should she have any questions or concerns prior to her return visit.   Follow up plan: - Return in about 7 weeks (around 01/08/2021) for Bring Meter and Logs- A1c in Office.  Glade Lloyd, MD [facility] [address] [place], [place] [address] Phone: [phone]  Fax: [phone]    11/23/2020, 12:21 PM  This note was partially dictated with voice recognition software. Similar sounding words can be transcribed inadequately or may not  be corrected upon review.

## 2020-11-23 NOTE — Patient Instructions (Signed)

## 2020-12-04 ENCOUNTER — Telehealth: Payer: Self-pay | Admitting: "Endocrinology

## 2020-12-04 NOTE — Telephone Encounter (Signed)
Pt is calling and states she is having side effects from her new medicine Dr Dorris Fetch just started on and is wanting the nurse to call and discuss.

## 2020-12-04 NOTE — Telephone Encounter (Signed)
Pt states she's experiencing diarrhea, nausea, "severe" fatigue, lack of appetite and shortness of breath upon activity since starting Rybelsus. States her BG readings are Tuesday 136 before breakfast, 145 at bedtime, Wednesday 146,152, Thursday 136,137, Friday 147, 173, Saturday 115, 157, Sunday 139,155 and this morning fasting 169.

## 2020-12-04 NOTE — Telephone Encounter (Signed)
She can hold Rybelsus to observe. She may restart Tradjenta  5mg  po daily if she has some left.

## 2020-12-05 NOTE — Telephone Encounter (Signed)
She is on the maximum dose of Metformin.  She can finish her Tradjenta supply she has.  We will decide on her next visit if she needs additional intervention.

## 2020-12-05 NOTE — Telephone Encounter (Signed)
Discussed with pt, understanding voiced. 

## 2020-12-05 NOTE — Telephone Encounter (Signed)
Pt states she held the Rybelsus this morning and has already noticed improvement in her symptoms. Stated she did start taking her tradjenta 5mg  daily and has enough for approximately a week. Asked if once she finished the tradjenta if she could take an increased dose of metformin instead of having to add an extra medication. Pt takes metformin 1000mg  twice daily.

## 2020-12-08 DIAGNOSIS — E1165 Type 2 diabetes mellitus with hyperglycemia: Secondary | ICD-10-CM | POA: Diagnosis not present

## 2020-12-08 DIAGNOSIS — K219 Gastro-esophageal reflux disease without esophagitis: Secondary | ICD-10-CM | POA: Diagnosis not present

## 2020-12-13 ENCOUNTER — Encounter: Payer: Medicare Other | Attending: "Endocrinology | Admitting: Nutrition

## 2020-12-13 ENCOUNTER — Other Ambulatory Visit: Payer: Self-pay

## 2020-12-13 ENCOUNTER — Encounter: Payer: Self-pay | Admitting: Nutrition

## 2020-12-13 VITALS — Ht 64.0 in | Wt 224.8 lb

## 2020-12-13 DIAGNOSIS — E669 Obesity, unspecified: Secondary | ICD-10-CM | POA: Diagnosis present

## 2020-12-13 DIAGNOSIS — I10 Essential (primary) hypertension: Secondary | ICD-10-CM | POA: Insufficient documentation

## 2020-12-13 DIAGNOSIS — E1165 Type 2 diabetes mellitus with hyperglycemia: Secondary | ICD-10-CM | POA: Insufficient documentation

## 2020-12-13 DIAGNOSIS — E782 Mixed hyperlipidemia: Secondary | ICD-10-CM | POA: Diagnosis not present

## 2020-12-13 NOTE — Progress Notes (Signed)
Medical Nutrition Therapy  Appointment Start time:  0800  Appointment End time:  0900  Primary concerns today: Dm Type 2 Referral diagnosis: E11.8 Preferred learning style:  no preference indicated Learning readiness:  change in progress   NUTRITION ASSESSMENT  She couldn't tolerate Rebelsus. Had vomiting, diarrhea,  Anthropometrics    Wt Readings from Last 3 Encounters:  12/13/20 224 lb 12.8 oz (102 kg)  11/23/20 228 lb (103.4 kg)  12/14/19 217 lb 12.8 oz (98.8 kg)   Ht Readings from Last 3 Encounters:  12/13/20 5\' 4"  (1.626 m)  11/23/20 5\' 4"  (1.626 m)  12/14/19 5\' 4"  (1.626 m)   Body mass index is 38.59 kg/m. @BMIFA @ Facility age limit for growth percentiles is 20 years. Facility age limit for growth percentiles is 20 years.   Clinical Medical Hx: GERD, Type 2 DM, HTN Medications: Metformin 1000 BID,  Labs:   Lab Results  Component Value Date   HGBA1C 9.3 10/10/2020   FBS130-150's.    Bedtime: Doesn't check usually Notable Signs/Symptoms: increased thirst and frequent urination and fatigue.  Lifestyle & Dietary Hx Lives by herself. Struggles with affording food that she needs to eat. Has a lot of stress in her life.  Estimated daily fluid intake:  oz Supplements: Vit D Sleep: varies Stress / self-care:  Current average weekly physical activity: walks some  24-Hr Dietary Recall First Meal: Scrambled egg, bacon, coffee-creamer, and toast and waffle,  Snack:  Second Meal: cabbage.pinto beans, cornbread,water Snack:  Third Meal: Pork chop, cabbage, potato salad, coffee and water Snack:  Beverages: water,   She notes she use to snack or graze a lot but working on cutting it out. Likes to eat snacks after supper.  Estimated Energy Needs Calories: 1200 Carbohydrate: 135g Protein: 90g Fat: 33g   NUTRITION DIAGNOSIS  NB-1.1 Food and nutrition-related knowledge deficit As related to Diabetes Type 2.  As evidenced by A1C 9.3%.   NUTRITION INTERVENTION   Nutrition education (E-1) on the following topics:  . Nutrition and Diabetes education provided on My Plate, CHO counting, meal planning, portion sizes, timing of meals, avoiding snacks between meals unless having a low blood sugar, target ranges for A1C and blood sugars, signs/symptoms and treatment of hyper/hypoglycemia, monitoring blood sugars, taking medications as prescribed, benefits of exercising 30 minutes per day and prevention of complications of DM. Marland Kitchen   Handouts Provided Include   The Plate Method  Meal Plan Card  Diabetes Instructions.  Learning Style & Readiness for Change Teaching method utilized: Visual & Auditory  Demonstrated degree of understanding via: Teach Back  Barriers to learning/adherence to lifestyle change: none  Goals Established by Pt Goals  Follow My Plate Eat meals on time Use grocery store flyer to buy foods on sale to plan meals Drink 100 oz of water per day Walk 30 minutes a day Avoid snacks Increase fresh fruits and vegetables. Lose 1 lb per week Keep FBS less than 130 and less than 150 at bedtime. Avoid salt seasonings.   MONITORING & EVALUATION Dietary intake, weekly physical activity, and blood sugars in 1 month.  Next Steps  Patient is to work on better meal planning.

## 2020-12-13 NOTE — Patient Instructions (Signed)
Goals  Follow My Plate Eat meals on time Use grocery store flyer to buy foods on sale to plan meals Drink 100 oz of water per day Walk 30 minutes a day Avoid snacks Increase fresh fruits and vegetables. Lose 1 lb per week Keep FBS less than 130 and less than 150 at bedtime. Avoid salt seasonings.

## 2021-01-02 ENCOUNTER — Encounter: Payer: Self-pay | Admitting: Nutrition

## 2021-01-07 DIAGNOSIS — E1165 Type 2 diabetes mellitus with hyperglycemia: Secondary | ICD-10-CM | POA: Diagnosis not present

## 2021-01-07 DIAGNOSIS — I10 Essential (primary) hypertension: Secondary | ICD-10-CM | POA: Diagnosis not present

## 2021-01-11 ENCOUNTER — Other Ambulatory Visit (HOSPITAL_COMMUNITY): Payer: Self-pay | Admitting: Internal Medicine

## 2021-01-11 ENCOUNTER — Other Ambulatory Visit: Payer: Self-pay

## 2021-01-11 ENCOUNTER — Ambulatory Visit: Payer: Medicare Other | Admitting: "Endocrinology

## 2021-01-11 ENCOUNTER — Ambulatory Visit (HOSPITAL_COMMUNITY)
Admission: RE | Admit: 2021-01-11 | Discharge: 2021-01-11 | Disposition: A | Discharge status: Home / Self Care | Payer: Medicare Other | Source: Ambulatory Visit | Attending: Internal Medicine | Admitting: Internal Medicine

## 2021-01-11 ENCOUNTER — Encounter: Payer: Medicare Other | Attending: "Endocrinology | Admitting: Nutrition

## 2021-01-11 ENCOUNTER — Encounter: Payer: Self-pay | Admitting: "Endocrinology

## 2021-01-11 ENCOUNTER — Encounter: Payer: Self-pay | Admitting: Nutrition

## 2021-01-11 VITALS — BP 138/76 | HR 96 | Ht 64.0 in | Wt 218.2 lb

## 2021-01-11 VITALS — Ht 64.0 in | Wt 218.0 lb

## 2021-01-11 DIAGNOSIS — I10 Essential (primary) hypertension: Secondary | ICD-10-CM | POA: Diagnosis not present

## 2021-01-11 DIAGNOSIS — E782 Mixed hyperlipidemia: Secondary | ICD-10-CM | POA: Diagnosis not present

## 2021-01-11 DIAGNOSIS — Z1231 Encounter for screening mammogram for malignant neoplasm of breast: Secondary | ICD-10-CM | POA: Diagnosis not present

## 2021-01-11 DIAGNOSIS — E1165 Type 2 diabetes mellitus with hyperglycemia: Secondary | ICD-10-CM

## 2021-01-11 DIAGNOSIS — E669 Obesity, unspecified: Secondary | ICD-10-CM

## 2021-01-11 LAB — POCT GLYCOSYLATED HEMOGLOBIN (HGB A1C): HbA1c, POC (controlled diabetic range): 7.7 % — AB (ref 0.0–7.0)

## 2021-01-11 MED ORDER — ACCU-CHEK AVIVA PLUS VI STRP: ORAL_STRIP | 2 refills | Status: DC

## 2021-01-11 NOTE — Progress Notes (Signed)
Endocrinology Consult Note       01/11/2021, 12:14 PM   Subjective:    Patient ID: Mary Robinson, female    DOB: 30-Mar-1956.  Mary Robinson is being seen in consultation for management of currently uncontrolled symptomatic diabetes requested by  Rosita Fire, MD.   Past Medical History:  Diagnosis Date  . Anemia   . Anxiety   . Arthritis   . Asthma   . Bursitis   . Chronic back pain   . Chronic constipation   . Depression   . Diastasis recti   . Essential hypertension   . GERD (gastroesophageal reflux disease)   . Helicobacter pylori gastritis    Treated in May 2011  . Hyperlipidemia   . Obesity   . S/P colonoscopy 12/2009   Multiple tubular adenomas: next TCS May 2014  . S/P endoscopy 12/2009   Severe erosive reflux esophagitis, noncritial schatzki's ring, +h.pylori  . Seasonal allergies   . Sleep apnea   . Type II diabetes mellitus ([place])   . Umbilical hernia     Past Surgical History:  Procedure Laterality Date  . CHOLECYSTECTOMY  1999  . COLONOSCOPY  May 2011   Dr. Gala Romney: long, torturous colon, multiple colonic polyps. TUBULAR ADENOMAS  . COLONOSCOPY N/A 04/15/2013   normal rectum, torturous colon.  tubular adenomas. Surveillance due 2019.   Marland Kitchen DILATION AND CURETTAGE OF UTERUS  1970's  . ESOPHAGOGASTRODUODENOSCOPY  May 2011   Dr. Gala Romney: severe erosive reflux esophagitis, non-critical Schatzki's ring, diffuse gastric erosions. H. PYLORI GASTRITIS  . HERNIA REPAIR  05/04/2013   laparoscopic incisional hernia repair w/mesh per notes(05/04/2013)  . INCISIONAL HERNIA REPAIR N/A 05/04/2013   Procedure: LAPAROSCOPIC INCISIONAL HERNIA ;  Surgeon: Adin Hector, MD;  Location: [facility];  Service: General;  Laterality: N/A;  . INSERTION OF MESH N/A 05/04/2013   Procedure: INSERTION OF MESH;  Surgeon: Adin Hector, MD;  Location: [facility];  Service: General;  Laterality: N/A;  . TUBAL LIGATION   1970's    Social History   Socioeconomic History  . Marital status: Single    Spouse name: Not on file  . Number of children: Not on file  . Years of education: Not on file  . Highest education level: Not on file  Occupational History  . Occupation: retired  Tobacco Use  . Smoking status: Never Smoker  . Smokeless tobacco: Never Used  Vaping Use  . Vaping Use: Never used  Substance and Sexual Activity  . Alcohol use: No  . Drug use: No  . Sexual activity: Never    Birth control/protection: Post-menopausal  Other Topics Concern  . Not on file  Social History Narrative  . Not on file   Social Determinants of Health   Financial Resource Strain: Not on file  Food Insecurity: Not on file  Transportation Needs: Not on file  Physical Activity: Not on file  Stress: Not on file  Social Connections: Not on file    Family History  Problem Relation Age of Onset  . Colon cancer Maternal Uncle   . Colon cancer Paternal Uncle   .  COPD Mother   . Hypertension Mother   . Seizures Mother   . Chronic Renal Failure Mother   . Hyperlipidemia Mother   . Stroke Mother   . Kidney disease Mother   . Heart failure Mother   . Heart disease Father   . Diabetes Sister   . Hypertension Sister   . Heart disease Sister   . Diabetes Brother   . Hypertension Brother   . Hypertension Daughter   . Hypertension Son   . Diabetes Sister   . Hypertension Sister   . Diabetes Sister   . Hypertension Sister     Outpatient Encounter Medications as of 01/11/2021  Medication Sig  . glucose blood (ACCU-CHEK AVIVA PLUS) test strip Test glucose 2 times a day  . acetaminophen (TYLENOL) 500 MG tablet Take 500-1,000 mg by mouth every 6 (six) hours as needed for moderate pain.   . Albuterol Sulfate, sensor, (PROAIR DIGIHALER) 108 (90 Base) MCG/ACT AEPB Inhale into the lungs as needed.  Marland Kitchen amLODipine (NORVASC) 10 MG tablet Take 10 mg by mouth daily with breakfast.   . aspirin 81 MG tablet Take 81 mg  by mouth daily.    . hydrochlorothiazide (HYDRODIURIL) 25 MG tablet Take 25 mg by mouth daily.  Marland Kitchen loratadine (CLARITIN) 10 MG tablet Take 10 mg by mouth daily.    Marland Kitchen losartan (COZAAR) 100 MG tablet Take 1 tablet (100 mg total) by mouth once. (Patient not taking: Reported on 01/11/2021)  . magnesium oxide (MAG-OX) 400 MG tablet Take 400 mg by mouth daily.   . metFORMIN (GLUCOPHAGE) 1000 MG tablet Take 1,000 mg by mouth 2 (two) times daily.  Marland Kitchen omeprazole (PRILOSEC) 20 MG capsule Take 20 mg by mouth daily with breakfast.   . polyethylene glycol (MIRALAX / GLYCOLAX) packet Take 17 g by mouth daily as needed (for constipation).  . simvastatin (ZOCOR) 20 MG tablet Take 20 mg by mouth daily.  Marland Kitchen spironolactone (ALDACTONE) 25 MG tablet Take 50 mg by mouth once.  . Vitamin D, Ergocalciferol, (DRISDOL) 1.25 MG (50000 UNIT) CAPS capsule Take 50,000 Units by mouth every 30 (thirty) days.  . [DISCONTINUED] Semaglutide (RYBELSUS) 3 MG TABS Take 3 mg by mouth daily before breakfast. (Patient not taking: Reported on 01/11/2021)   No facility-administered encounter medications on file as of 01/11/2021.    ALLERGIES: No Known Allergies  VACCINATION STATUS: Immunization History  Administered Date(s) Administered  . Influenza-Unspecified 06/29/2014, 07/03/2018    Diabetes She presents for her follow-up diabetic visit. She has type 2 diabetes mellitus. Her disease course has been improving. There are no hypoglycemic associated symptoms. Pertinent negatives for hypoglycemia include no confusion, headaches, pallor or seizures. Pertinent negatives for diabetes include no chest pain, no fatigue, no polydipsia, no polyphagia and no polyuria. There are no hypoglycemic complications. Symptoms are improving. Risk factors for coronary artery disease include dyslipidemia, diabetes mellitus, family history, obesity, hypertension, sedentary lifestyle and post-menopausal. Current diabetic treatments: She is currently on  Metformin 1000 mg p.o. twice daily, Tradjenta 5 mg p.o. daily. Her weight is decreasing steadily. She is following a generally unhealthy diet. When asked about meal planning, she reported none. She has not had a previous visit with a dietitian. She rarely participates in exercise. Her home blood glucose trend is decreasing steadily. (She presents with significant improvement in her glycemic profile to near target levels.  Her point-of-care A1c today 7.7%, improving from 9.3%.    She did not tolerate Rybelsus which she discontinued.  ) An ACE  inhibitor/angiotensin II receptor blocker is being taken. She does not see a podiatrist.Eye exam is current.  Hyperlipidemia This is a chronic problem. The current episode started more than 1 year ago. The problem is uncontrolled. Exacerbating diseases include diabetes and obesity. Pertinent negatives include no chest pain, myalgias or shortness of breath. Current antihyperlipidemic treatment includes statins. Risk factors for coronary artery disease include diabetes mellitus, dyslipidemia, hypertension, a sedentary lifestyle, post-menopausal, obesity and family history.  Hypertension This is a chronic problem. The current episode started more than 1 year ago. The problem is controlled. Pertinent negatives include no chest pain, headaches, palpitations or shortness of breath. Risk factors for coronary artery disease include dyslipidemia, diabetes mellitus, obesity, sedentary lifestyle, family history and post-menopausal state. Past treatments include angiotensin blockers and calcium channel blockers.     Review of Systems  Constitutional: Negative for chills, fatigue, fever and unexpected weight change.  HENT: Negative for trouble swallowing and voice change.   Eyes: Negative for visual disturbance.  Respiratory: Negative for cough, shortness of breath and wheezing.   Cardiovascular: Negative for chest pain, palpitations and leg swelling.  Gastrointestinal:  Negative for diarrhea, nausea and vomiting.  Endocrine: Negative for cold intolerance, heat intolerance, polydipsia, polyphagia and polyuria.  Musculoskeletal: Negative for arthralgias and myalgias.  Skin: Negative for color change, pallor, rash and wound.  Neurological: Negative for seizures and headaches.  Psychiatric/Behavioral: Negative for confusion and suicidal ideas.    Objective:    Vitals with BMI 01/11/2021 01/11/2021 12/13/2020  Height 5\' 4"  5\' 4"  5\' 4"   Weight 218 lbs 218 lbs 3 oz 224 lbs 13 oz  BMI 37.4 70.35 00.93  Systolic - 818 -  Diastolic - 76 -  Pulse - 96 -    BP 138/76   Pulse 96   Ht 5\' 4"  (1.626 m)   Wt 218 lb 3.2 oz (99 kg)   BMI 37.45 kg/m   Wt Readings from Last 3 Encounters:  01/11/21 218 lb (98.9 kg)  01/11/21 218 lb 3.2 oz (99 kg)  12/13/20 224 lb 12.8 oz (102 kg)     Physical Exam Constitutional:      Appearance: She is well-developed.  HENT:     Head: Normocephalic and atraumatic.  Neck:     Thyroid: No thyromegaly.     Trachea: No tracheal deviation.  Cardiovascular:     Rate and Rhythm: Normal rate.  Pulmonary:     Effort: Pulmonary effort is normal.  Abdominal:     General: Bowel sounds are normal.     Palpations: Abdomen is soft.     Tenderness: There is no abdominal tenderness. There is no guarding.  Musculoskeletal:        General: Normal range of motion.     Cervical back: Normal range of motion and neck supple.  Skin:    General: Skin is warm and dry.     Coloration: Skin is not pale.     Findings: No erythema or rash.  Neurological:     General: No focal deficit present.     Mental Status: She is alert and oriented to person, place, and time.     Cranial Nerves: No cranial nerve deficit.     Coordination: Coordination normal.     Deep Tendon Reflexes: Reflexes are normal and symmetric.     Comments: Normal monofilament sensation test on bilateral lower extremities.    Psychiatric:        Judgment: Judgment normal.  CMP ( most recent) CMP     Component Value Date/Time   NA 138 11/09/2019 1020   K 4.6 11/09/2019 1020   CL 101 11/09/2019 1020   CO2 29 11/09/2019 1020   GLUCOSE 152 (H) 11/09/2019 1020   BUN 20 10/10/2020 0000   CREATININE 1.0 10/10/2020 0000   CREATININE 1.05 (H) 11/09/2019 1020   CALCIUM 9.6 11/09/2019 1020   PROT 7.9 04/30/2013 1025   ALBUMIN 3.8 04/30/2013 1025   AST 34 04/30/2013 1025   ALT 38 (H) 04/30/2013 1025   ALKPHOS 72 04/30/2013 1025   BILITOT 0.2 (L) 04/30/2013 1025   GFRNONAA 59 10/10/2020 0000   GFRNONAA 55 (L) 11/09/2019 1020   GFRAA 69 10/10/2020 0000   GFRAA 64 11/09/2019 1020     Diabetic Labs (most recent): Lab Results  Component Value Date   HGBA1C 7.7 (A) 01/11/2021   HGBA1C 9.3 10/10/2020   HGBA1C 7.9 (H) 05/04/2013     Lipid Panel ( most recent) Lipid Panel     Component Value Date/Time   CHOL 139 10/10/2020 0000   TRIG 190 (A) 10/10/2020 0000   HDL 50 10/10/2020 0000   LDLCALC 63 10/10/2020 0000      Assessment & Plan:   1. Uncontrolled type 2 diabetes mellitus with hyperglycemia ([place]) - Mary Robinson has currently uncontrolled symptomatic type 2 DM since  65 years of age.  She presents with significant improvement in her glycemic profile to near target levels.  Her point-of-care A1c today 7.7%, improving from 9.3%.   She did not tolerate Rybelsus which she discontinued.  -Recent labs reviewed. - I had a long discussion with her about the progressive nature of diabetes and the pathology behind its complications. -her diabetes is complicated by obesity/sedentary life and she remains at a high risk for more acute and chronic complications which include CAD, CVA, CKD, retinopathy, and neuropathy. These are all discussed in detail with her.  - I have counseled her on diet  and weight management  by adopting a carbohydrate restricted/protein rich diet. Patient is encouraged to switch to  unprocessed or minimally processed      complex starch and increased protein intake (animal or plant source), fruits, and vegetables. -  she is advised to stick to a routine mealtimes to eat 3 meals  a day and avoid unnecessary snacks ( to snack only to correct hypoglycemia).   - she acknowledges that there is a room for improvement in her food and drink choices. - Suggestion is made for her to avoid simple carbohydrates  from her diet including Cakes, Sweet Desserts, Ice Cream, Soda (diet and regular), Sweet Tea, Candies, Chips, Cookies, Store Bought Juices, Alcohol in Excess of  1-2 drinks a day, Artificial Sweeteners,  Coffee Creamer, and "Sugar-free" Products, Lemonade. This will help patient to have more stable blood glucose profile and potentially avoid unintended weight gain.   - she has been scheduled with Jearld Fenton, RDN, CDE for diabetes education.  - I have approached her with the following individualized plan to manage  her diabetes and patient agrees:   - she will like to avoid injectable treatment.  She did not tolerate Rybelsus. -She presents with near target glycemic profile only on metformin.  She is advised to continue metformin 1000 mg p.o. twice daily, associated with monitoring of blood glucose twice a day-daily before breakfast and at bedtime.  - she is encouraged to call clinic for blood glucose levels less than 70 or above  200 mg per DL at fasting .   - Specific targets for  A1c;  LDL, HDL,  and Triglycerides were discussed with the patient.  2) Blood Pressure /Hypertension: Her blood pressure is controlled to target. she is advised to continue her current medications including amlodipine 10 mg p.o. daily, losartan 100 mg p.o. daily at breakfast.   3) Lipids/Hyperlipidemia:   Review of her recent lipid panel showed  controlled  LDL at 63 .  she  is advised to continue simvastatin 20 mg p.o. daily at bedtime.  Side effects and precautions discussed with her.     4)  Weight/Diet:  Body mass index is  37.45 kg/m.  -   clearly complicating her diabetes care.  She lost 6 pounds since last visit,  she is  a candidate for more weight loss. I discussed with her the fact that loss of 5 - 10% of her  current body weight will have the most impact on her diabetes management.  Exercise, and detailed carbohydrates information provided  -  detailed on discharge instructions.  5) Chronic Care/Health Maintenance:  -she   on ACEI/ARB and Statin medications and  is encouraged to initiate and continue to follow up with Ophthalmology, Dentist,  Podiatrist at least yearly or according to recommendations, and advised to   stay away from smoking. I have recommended yearly flu vaccine and pneumonia vaccine at least every 5 years; moderate intensity exercise for up to 150 minutes weekly; and  sleep for at least 7 hours a day.  - she is  advised to maintain close follow up with Rosita Fire, MD for primary care needs, as well as her other providers for optimal and coordinated care.     I spent 42 minutes in the care of the patient today including review of labs from [facility], Lipids, Thyroid Function, Hematology (current and previous including abstractions from other facilities); face-to-face time discussing  her blood glucose readings/logs, discussing hypoglycemia and hyperglycemia episodes and symptoms, medications doses, her options of short and long term treatment based on the latest standards of care / guidelines;  discussion about incorporating lifestyle medicine;  and documenting the encounter.    Please refer to Patient Instructions for Blood Glucose Monitoring and Insulin/Medications Dosing Guide"  in media tab for additional information. Please  also refer to " Patient Self Inventory" in the Media  tab for reviewed elements of pertinent patient history.  Mary Robinson participated in the discussions, expressed understanding, and voiced agreement with the above plans.  All questions were answered to her  satisfaction. she is encouraged to contact clinic should she have any questions or concerns prior to her return visit.    Follow up plan: - Return in about 3 months (around 04/16/2021) for Bring Meter and Logs- A1c in Office.  Glade Lloyd, MD [facility] [address] [place], [place] [address] Phone: [phone]  Fax: [phone]    01/11/2021, 12:14 PM  This note was partially dictated with voice recognition software. Similar sounding words can be transcribed inadequately or may not  be corrected upon review.

## 2021-01-11 NOTE — Progress Notes (Signed)
Requesting Rx for accu-chek aviva plus test strips.

## 2021-01-11 NOTE — Patient Instructions (Signed)
Goals  Walk 30 minutes 4 times per week. LOok up new recipes to try. CHeck out the [employer] and talk on treadmill  With sister to socialize and exercise. Lose 1/2-1 lb per week.

## 2021-01-11 NOTE — Progress Notes (Signed)
Medical Nutrition Therapy  Dm and Obesity follow up 1030-1100 am Changes made: Cut out snacks, processed foods. has more energy. Drinking water and eating a lot more vegetables and fresh fruits. Being a lot more active. Feels a lot better. Lost 10 lbs in the last month. A1C down to 7.7% from 9.3%  Has stopped her Losartan due to BP doing so well.  Anthropometrics    Wt Readings from Last 3 Encounters:  01/11/21 218 lb 3.2 oz (99 kg)  12/13/20 224 lb 12.8 oz (102 kg)  11/23/20 228 lb (103.4 kg)   Ht Readings from Last 3 Encounters:  01/11/21 5\' 4"  (1.626 m)  12/13/20 5\' 4"  (1.626 m)  11/23/20 5\' 4"  (1.626 m)   There is no height or weight on file to calculate BMI. @BMIFA @ Facility age limit for growth percentiles is 20 years. Facility age limit for growth percentiles is 20 years.   Clinical Medical Hx: GERD, Type 2 DM, HTN Medications: Metformin 1000 BID,  Labs:   Lab Results  Component Value Date   HGBA1C 7.7 (A) 01/11/2021   [id number].    Bedtime: 130'-160's Notable Signs/Symptoms:Feeling much better. Better sleep, more energy. Not hungry to graze or snack.  Lifestyle & Dietary Hx Lives by herself. Struggles with affording food that she needs to eat. Has a lot of stress in her life.  Estimated daily fluid intake: 64  oz Supplements: Vit D Sleep: varies Stress / self-care: reduced since eating better Current average weekly physical activity: walks some  24-Hr Dietary Recall First Meal: raisin bran cereal or cherrios with egg or oatmeal Snack:  Second Meal: cabbage, corn, greens, water  Third Meal: Usually leftovers from lunch or night before.  Beverages: water,    Estimated Energy Needs Calories: 1200 Carbohydrate: 135g Protein: 90g Fat: 33g   NUTRITION DIAGNOSIS  NB-1.1 Food and nutrition-related knowledge deficit As related to Diabetes Type 2.  As evidenced by A1C 9.3%.   NUTRITION INTERVENTION  Nutrition education (E-1) on the following  topics:  . Nutrition and Diabetes education provided on My Plate, CHO counting, meal planning, portion sizes, timing of meals, avoiding snacks between meals unless having a low blood sugar, target ranges for A1C and blood sugars, signs/symptoms and treatment of hyper/hypoglycemia, monitoring blood sugars, taking medications as prescribed, benefits of exercising 30 minutes per day and prevention of complications of DM. Marland Kitchen   Handouts Provided Include   Learning Style & Readiness for Change Teaching method utilized: Visual & Auditory  Demonstrated degree of understanding via: Teach Back  Barriers to learning/adherence to lifestyle change: none  Previous goals:  Follow My Plate-done Eat meals on time-done Use grocery store flyer to buy foods on sale to plan meals-done Drink 100 oz of water per day-getting close Walk 30 minutes a day-working on being more active. Avoid snacks-done Increase fresh fruits and vegetables. Lose 1 lb per week-done Keep FBS less than 130 and less than 150 at bedtime- just about there.. Avoid salt seasonings-uses only Mrs Deliah Boston now  New goals set by patient:  Goals  Walk 30 minutes 4 times per week. LOok up new recipes to try. CHeck out the [employer] and talk on treadmill  With sister to socialize and exercise. Lose 1/2-1 lb per week.    MONITORING & EVALUATION Dietary intake, weekly physical activity, and blood sugars in 1 month.  Next Steps  Keep up the great job and increase exercise.

## 2021-01-11 NOTE — Patient Instructions (Signed)

## 2021-01-24 ENCOUNTER — Ambulatory Visit: Payer: Medicare Other | Admitting: "Endocrinology

## 2021-02-07 DIAGNOSIS — E1165 Type 2 diabetes mellitus with hyperglycemia: Secondary | ICD-10-CM | POA: Diagnosis not present

## 2021-02-07 DIAGNOSIS — I10 Essential (primary) hypertension: Secondary | ICD-10-CM | POA: Diagnosis not present

## 2021-03-09 DIAGNOSIS — E1165 Type 2 diabetes mellitus with hyperglycemia: Secondary | ICD-10-CM | POA: Diagnosis not present

## 2021-03-09 DIAGNOSIS — I10 Essential (primary) hypertension: Secondary | ICD-10-CM | POA: Diagnosis not present

## 2021-04-02 DIAGNOSIS — E1165 Type 2 diabetes mellitus with hyperglycemia: Secondary | ICD-10-CM | POA: Diagnosis not present

## 2021-04-02 DIAGNOSIS — J4530 Mild persistent asthma, uncomplicated: Secondary | ICD-10-CM | POA: Diagnosis not present

## 2021-04-02 DIAGNOSIS — I10 Essential (primary) hypertension: Secondary | ICD-10-CM | POA: Diagnosis not present

## 2021-04-12 DIAGNOSIS — E1165 Type 2 diabetes mellitus with hyperglycemia: Secondary | ICD-10-CM | POA: Diagnosis not present

## 2021-04-12 DIAGNOSIS — E785 Hyperlipidemia, unspecified: Secondary | ICD-10-CM | POA: Diagnosis not present

## 2021-04-12 DIAGNOSIS — I10 Essential (primary) hypertension: Secondary | ICD-10-CM | POA: Diagnosis not present

## 2021-04-12 DIAGNOSIS — R5383 Other fatigue: Secondary | ICD-10-CM | POA: Diagnosis not present

## 2021-04-12 DIAGNOSIS — G4700 Insomnia, unspecified: Secondary | ICD-10-CM | POA: Diagnosis not present

## 2021-04-12 DIAGNOSIS — K219 Gastro-esophageal reflux disease without esophagitis: Secondary | ICD-10-CM | POA: Diagnosis not present

## 2021-04-25 ENCOUNTER — Other Ambulatory Visit: Payer: Self-pay

## 2021-04-25 ENCOUNTER — Ambulatory Visit (INDEPENDENT_AMBULATORY_CARE_PROVIDER_SITE_OTHER): Payer: Medicare Other | Admitting: "Endocrinology

## 2021-04-25 ENCOUNTER — Encounter: Payer: Self-pay | Admitting: "Endocrinology

## 2021-04-25 ENCOUNTER — Encounter: Payer: Self-pay | Admitting: Nutrition

## 2021-04-25 ENCOUNTER — Encounter: Payer: Medicare Other | Attending: "Endocrinology | Admitting: Nutrition

## 2021-04-25 VITALS — Ht 64.0 in | Wt 207.0 lb

## 2021-04-25 VITALS — BP 136/78 | HR 88 | Ht 64.0 in | Wt 207.6 lb

## 2021-04-25 DIAGNOSIS — E782 Mixed hyperlipidemia: Secondary | ICD-10-CM | POA: Insufficient documentation

## 2021-04-25 DIAGNOSIS — I10 Essential (primary) hypertension: Secondary | ICD-10-CM

## 2021-04-25 DIAGNOSIS — E1165 Type 2 diabetes mellitus with hyperglycemia: Secondary | ICD-10-CM | POA: Insufficient documentation

## 2021-04-25 DIAGNOSIS — E669 Obesity, unspecified: Secondary | ICD-10-CM | POA: Diagnosis present

## 2021-04-25 NOTE — Progress Notes (Signed)
Medical Nutrition Therapy  Dm and Obesity follow up [id number] am  Changes made:  [facility] [employer] [facility]. No low blood sugars. So is just now walking for exercise.  Cut out snacks, processed foods. has more energy. Drinking water and eating a lot more vegetables and fresh fruits. Being a lot more active. Feels a lot better. Lost  11 lbs since last visit. Has lost a total of 21 lbs. Has been eating more plant based foods and avoiding processed foods   BS before bed 170's,  In am 130--140's. Feels much better. A1C down to 7.2% from 7.7% BP has improved, weight has improved since she is eating better and exercising.  Anthropometrics    Wt Readings from Last 3 Encounters:  04/25/21 207 lb 9.6 oz (94.2 kg)  01/11/21 218 lb (98.9 kg)  01/11/21 218 lb 3.2 oz (99 kg)   Ht Readings from Last 3 Encounters:  04/25/21 '5\' 4"'$  (1.626 m)  01/11/21 '5\' 4"'$  (1.626 m)  01/11/21 '5\' 4"'$  (1.626 m)   There is no height or weight on file to calculate BMI. '@BMIFA'$ @ Facility age limit for growth percentiles is 20 years. Facility age limit for growth percentiles is 20 years.   Clinical Medical Hx: GERD, Type 2 DM, HTN Medications: Metformin 1000 BID,  Labs:   Lab Results  Component Value Date   HGBA1C 7.7 (A) 01/11/2021   [id number].    Bedtime: 130'-160's Notable Signs/Symptoms:Feeling much better. Better sleep, more energy. Not hungry to graze or snack.  Lifestyle & Dietary Hx Lives by herself. Struggles with affording food that she needs to eat. Her stress is a lot better. She bought a house in [place]   Estimated daily fluid intake: 64  oz Supplements: Vit D Sleep: varies Stress / self-care: reduced since eating better Current average weekly physical activity: walks some  24-Hr Dietary Recall First Meal: raisin bran cereal or cherrios with egg or oatmeal Snack:  Second Meal: cabbage, corn, greens, water  Third Meal: Usually leftovers from lunch or  night before.  Beverages: water,    Estimated Energy Needs Calories: 1200 Carbohydrate: 135g Protein: 90g Fat: 33g   NUTRITION DIAGNOSIS  NB-1.1 Food and nutrition-related knowledge deficit As related to Diabetes Type 2.  As evidenced by A1C 9.3%.   NUTRITION INTERVENTION  Nutrition education (E-1) on the following topics:  Nutrition and Diabetes education provided on My Plate, CHO counting, meal planning, portion sizes, timing of meals, avoiding snacks between meals unless having a low blood sugar, target ranges for A1C and blood sugars, signs/symptoms and treatment of hyper/hypoglycemia, monitoring blood sugars, taking medications as prescribed, benefits of exercising 30 minutes per day and prevention of complications of DM.   Handouts Provided Include   Learning Style & Readiness for Change Teaching method utilized: Visual & Auditory  Demonstrated degree of understanding via: Teach Back  Barriers to learning/adherence to lifestyle change: none  Previous goals:  Keep up the great job!   New goals set by patient:  Goals Keep up the great job! Walk 30 minutes 4 times per week. Look up new recipes to try. Focus on plant based foods.    MONITORING & EVALUATION Dietary intake, weekly physical activity, and blood sugars in 4 month.  Next Steps  Keep up the great job and increase exercise as tolerated.

## 2021-04-25 NOTE — Progress Notes (Signed)
04/25/2021, 9:11 AM   Endocrinology follow-up note  Subjective:    Patient ID: Mary Robinson, female    DOB: [birth year].  Mary Robinson is being seen in follow-up after she was seen in consultation for management of currently uncontrolled symptomatic diabetes requested by  Rosita Fire, MD.   Past Medical History:  Diagnosis Date   Anemia    Anxiety    Arthritis    Asthma    Bursitis    Chronic back pain    Chronic constipation    Depression    Diastasis recti    Essential hypertension    GERD (gastroesophageal reflux disease)    Helicobacter pylori gastritis    Treated in May 2011   Hyperlipidemia    Obesity    S/P colonoscopy 12/2009   Multiple tubular adenomas: next TCS May 2014   S/P endoscopy 12/2009   Severe erosive reflux esophagitis, noncritial schatzki's ring, +h.pylori   Seasonal allergies    Sleep apnea    Type II diabetes mellitus ([place])    Umbilical hernia     Past Surgical History:  Procedure Laterality Date   CHOLECYSTECTOMY  1999   COLONOSCOPY  May 2011   Dr. Gala Romney: long, torturous colon, multiple colonic polyps. TUBULAR ADENOMAS   COLONOSCOPY N/A 04/15/2013   normal rectum, torturous colon.  tubular adenomas. Surveillance due 2019.    DILATION AND CURETTAGE OF UTERUS  1970's   ESOPHAGOGASTRODUODENOSCOPY  May 2011   Dr. Gala Romney: severe erosive reflux esophagitis, non-critical Schatzki's ring, diffuse gastric erosions. H. PYLORI GASTRITIS   HERNIA REPAIR  05/04/2013   laparoscopic incisional hernia repair w/mesh per notes(05/04/2013)   INCISIONAL HERNIA REPAIR N/A 05/04/2013   Procedure: LAPAROSCOPIC INCISIONAL HERNIA ;  Surgeon: Adin Hector, MD;  Location: [place];  Service: General;  Laterality: N/A;   INSERTION OF MESH N/A 05/04/2013   Procedure: INSERTION OF MESH;  Surgeon: Adin Hector, MD;  Location: [facility];  Service: General;  Laterality: N/A;   TUBAL  LIGATION  1970's    Social History   Socioeconomic History   Marital status: Single    Spouse name: Not on file   Number of children: Not on file   Years of education: Not on file   Highest education level: Not on file  Occupational History   Occupation: retired  Tobacco Use   Smoking status: Never   Smokeless tobacco: Never  Vaping Use   Vaping Use: Never used  Substance and Sexual Activity   Alcohol use: No   Drug use: No   Sexual activity: Never    Birth control/protection: Post-menopausal  Other Topics Concern   Not on file  Social History Narrative   Not on file   Social Determinants of Health   Financial Resource Strain: Not on file  Food Insecurity: Not on file  Transportation Needs: Not on file  Physical Activity: Not on file  Stress: Not on file  Social Connections: Not on file    Family History  Problem Relation Age of Onset   Colon cancer Maternal Uncle    Colon cancer Paternal Uncle  COPD Mother    Hypertension Mother    Seizures Mother    Chronic Renal Failure Mother    Hyperlipidemia Mother    Stroke Mother    Kidney disease Mother    Heart failure Mother    Heart disease Father    Diabetes Sister    Hypertension Sister    Heart disease Sister    Diabetes Brother    Hypertension Brother    Hypertension Daughter    Hypertension Son    Diabetes Sister    Hypertension Sister    Diabetes Sister    Hypertension Sister     Outpatient Encounter Medications as of 04/25/2021  Medication Sig   acetaminophen (TYLENOL) 500 MG tablet Take 500-1,000 mg by mouth every 6 (six) hours as needed for moderate pain.    Albuterol Sulfate, sensor, (PROAIR DIGIHALER) 108 (90 Base) MCG/ACT AEPB Inhale into the lungs as needed.   amLODipine (NORVASC) 10 MG tablet Take 10 mg by mouth daily with breakfast.    aspirin 81 MG tablet Take 81 mg by mouth daily.     glucose blood (ACCU-CHEK AVIVA PLUS) test strip Test glucose 2 times a day   hydrochlorothiazide  (HYDRODIURIL) 25 MG tablet Take 25 mg by mouth daily.   loratadine (CLARITIN) 10 MG tablet Take 10 mg by mouth daily.     magnesium oxide (MAG-OX) 400 MG tablet Take 400 mg by mouth daily.    metFORMIN (GLUCOPHAGE) 1000 MG tablet Take 1,000 mg by mouth 2 (two) times daily.   omeprazole (PRILOSEC) 20 MG capsule Take 20 mg by mouth daily with breakfast.    polyethylene glycol (MIRALAX / GLYCOLAX) packet Take 17 g by mouth daily as needed (for constipation).   simvastatin (ZOCOR) 20 MG tablet Take 20 mg by mouth daily.   spironolactone (ALDACTONE) 25 MG tablet Take 50 mg by mouth once.   Vitamin D, Ergocalciferol, (DRISDOL) 1.25 MG (50000 UNIT) CAPS capsule Take 50,000 Units by mouth every 30 (thirty) days.   [DISCONTINUED] losartan (COZAAR) 100 MG tablet Take 1 tablet (100 mg total) by mouth once. (Patient not taking: Reported on 01/11/2021)   No facility-administered encounter medications on file as of 04/25/2021.    ALLERGIES: No Known Allergies  VACCINATION STATUS: Immunization History  Administered Date(s) Administered   Influenza-Unspecified 06/29/2014, 07/03/2018    Diabetes She presents for her follow-up diabetic visit. She has type 2 diabetes mellitus. Her disease course has been improving. There are no hypoglycemic associated symptoms. Pertinent negatives for hypoglycemia include no confusion, headaches, pallor or seizures. Pertinent negatives for diabetes include no chest pain, no fatigue, no polydipsia, no polyphagia and no polyuria. There are no hypoglycemic complications. Symptoms are improving. Risk factors for coronary artery disease include dyslipidemia, diabetes mellitus, family history, obesity, hypertension, sedentary lifestyle and post-menopausal. Current diabetic treatments: She is currently on Metformin 1000 mg p.o. twice daily, Tradjenta 5 mg p.o. daily. Her weight is decreasing steadily. She is following a generally unhealthy diet. When asked about meal planning, she  reported none. She has not had a previous visit with a dietitian. She rarely participates in exercise. Her home blood glucose trend is decreasing steadily. Her breakfast blood glucose range is generally 140-180 mg/dl. Her overall blood glucose range is 140-180 mg/dl. (She presents with her meter showing significantly improved glycemic profile averaging 135-165 for the last 30 days.  Her point-of-care A1c 7.2%, improving from 9.3%.    She did not tolerate Rybelsus which she discontinued.  She remains only on  metformin 1000 mg p.o. twice daily.) An ACE inhibitor/angiotensin II receptor blocker is being taken. She does not see a podiatrist.Eye exam is current.  Hyperlipidemia This is a chronic problem. The current episode started more than 1 year ago. The problem is uncontrolled. Exacerbating diseases include diabetes and obesity. Pertinent negatives include no chest pain, myalgias or shortness of breath. Current antihyperlipidemic treatment includes statins. Risk factors for coronary artery disease include diabetes mellitus, dyslipidemia, hypertension, a sedentary lifestyle, post-menopausal, obesity and family history.  Hypertension This is a chronic problem. The current episode started more than 1 year ago. The problem is controlled. Pertinent negatives include no chest pain, headaches, palpitations or shortness of breath. Risk factors for coronary artery disease include dyslipidemia, diabetes mellitus, obesity, sedentary lifestyle, family history and post-menopausal state. Past treatments include angiotensin blockers and calcium channel blockers.    Review of Systems  Constitutional:  Negative for chills, fatigue, fever and unexpected weight change.  HENT:  Negative for trouble swallowing and voice change.   Eyes:  Negative for visual disturbance.  Respiratory:  Negative for cough, shortness of breath and wheezing.   Cardiovascular:  Negative for chest pain, palpitations and leg swelling.   Gastrointestinal:  Negative for diarrhea, nausea and vomiting.  Endocrine: Negative for cold intolerance, heat intolerance, polydipsia, polyphagia and polyuria.  Musculoskeletal:  Negative for arthralgias and myalgias.  Skin:  Negative for color change, pallor, rash and wound.  Neurological:  Negative for seizures and headaches.  Psychiatric/Behavioral:  Negative for confusion and suicidal ideas.    Objective:    Vitals with BMI 04/25/2021 01/11/2021 01/11/2021  Height '5\' 4"'$  '5\' 4"'$  '5\' 4"'$   Weight 207 lbs 10 oz 218 lbs 218 lbs 3 oz  BMI 35.62 0000000 [id number]  Systolic [id number] - 0000000  Diastolic 78 - 76  Pulse 88 - 96    BP 136/78   Pulse 88   Ht '5\' 4"'$  (1.626 m)   Wt 207 lb 9.6 oz (94.2 kg)   BMI 35.63 kg/m   Wt Readings from Last 3 Encounters:  04/25/21 207 lb 9.6 oz (94.2 kg)  01/11/21 218 lb (98.9 kg)  01/11/21 218 lb 3.2 oz (99 kg)     Physical Exam Constitutional:      Appearance: She is well-developed.  HENT:     Head: Normocephalic and atraumatic.  Neck:     Thyroid: No thyromegaly.     Trachea: No tracheal deviation.  Cardiovascular:     Rate and Rhythm: Normal rate.  Pulmonary:     Effort: Pulmonary effort is normal.  Abdominal:     General: Bowel sounds are normal.     Palpations: Abdomen is soft.     Tenderness: There is no abdominal tenderness. There is no guarding.  Musculoskeletal:        General: Normal range of motion.     Cervical back: Normal range of motion and neck supple.  Skin:    General: Skin is warm and dry.     Coloration: Skin is not pale.     Findings: No erythema or rash.  Neurological:     General: No focal deficit present.     Mental Status: She is alert and oriented to person, place, and time.     Cranial Nerves: No cranial nerve deficit.     Coordination: Coordination normal.     Deep Tendon Reflexes: Reflexes are normal and symmetric.     Comments: Normal monofilament sensation test on bilateral lower extremities.  Psychiatric:         Judgment: Judgment normal.      CMP ( most recent) CMP     Component Value Date/Time   NA 138 11/09/2019 1020   K 4.6 11/09/2019 1020   CL 101 11/09/2019 1020   CO2 29 11/09/2019 1020   GLUCOSE 152 (H) 11/09/2019 1020   BUN 20 10/10/2020 0000   CREATININE 1.0 10/10/2020 0000   CREATININE 1.05 (H) 11/09/2019 1020   CALCIUM 9.6 11/09/2019 1020   PROT 7.9 04/30/2013 1025   ALBUMIN 3.8 04/30/2013 1025   AST 34 04/30/2013 1025   ALT 38 (H) 04/30/2013 1025   ALKPHOS 72 04/30/2013 1025   BILITOT 0.2 (L) 04/30/2013 1025   GFRNONAA 59 10/10/2020 0000   GFRNONAA 55 (L) 11/09/2019 1020   GFRAA 69 10/10/2020 0000   GFRAA 64 11/09/2019 1020     Diabetic Labs (most recent): Lab Results  Component Value Date   HGBA1C 7.7 (A) 01/11/2021   HGBA1C 9.3 10/10/2020   HGBA1C 7.9 (H) 05/04/2013     Lipid Panel ( most recent) Lipid Panel     Component Value Date/Time   CHOL 139 10/10/2020 0000   TRIG 190 (A) 10/10/2020 0000   HDL 50 10/10/2020 0000   LDLCALC 63 10/10/2020 0000      Assessment & Plan:   1. Uncontrolled type 2 diabetes mellitus with hyperglycemia ([place]) - SHALIYA TRIBETT has currently uncontrolled symptomatic type 2 DM since  65 years of age.  She presents with her meter showing significantly improved glycemic profile averaging 135-165 for the last 30 days.  Her point-of-care A1c 7.2%, improving from 9.3%.    She did not tolerate Rybelsus which she discontinued.  She remains only on metformin 1000 mg p.o. twice daily.  -Recent labs reviewed. - I had a long discussion with her about the progressive nature of diabetes and the pathology behind its complications. -her diabetes is complicated by obesity/sedentary life and she remains at a high risk for more acute and chronic complications which include CAD, CVA, CKD, retinopathy, and neuropathy. These are all discussed in detail with her.  - I have counseled her on diet  and weight management  by adopting a  carbohydrate restricted/protein rich diet. Patient is encouraged to switch to  unprocessed or minimally processed     complex starch and increased protein intake (animal or plant source), fruits, and vegetables. -  she is advised to stick to a routine mealtimes to eat 3 meals  a day and avoid unnecessary snacks ( to snack only to correct hypoglycemia).   -- she acknowledges that there is a room for improvement in her food and drink choices. - Suggestion is made for her to avoid simple carbohydrates  from her diet including Cakes, Sweet Desserts, Ice Cream, Soda (diet and regular), Sweet Tea, Candies, Chips, Cookies, Store Bought Juices, Alcohol in Excess of  1-2 drinks a day, Artificial Sweeteners,  Coffee Creamer, and "Sugar-free" Products, Lemonade. This will help patient to have more stable blood glucose profile and potentially avoid unintended weight gain.   - she has been scheduled with Jearld Fenton, RDN, CDE for diabetes education.  - I have approached her with the following individualized plan to manage  her diabetes and patient agrees:   - she would like to avoid injectable treatment.  She did not tolerate Rybelsus.    -In light of her presentation with target glycemic profile, she will not need those interventions.  She will  continue metformin 1000 mg p.o. twice daily-daily after breakfast and after supper.  She is urged to continue monitoring blood glucose at least once a day daily before breakfast.   - she is encouraged to call clinic for blood glucose levels less than 70 or above 200 mg per DL at fasting .   - Specific targets for  A1c;  LDL, HDL,  and Triglycerides were discussed with the patient.  2) Blood Pressure /Hypertension: Her blood pressure is controlled to target. she is advised to continue her current medications including amlodipine 10 mg p.o. daily, losartan 100 mg p.o. daily with breakfast.     3) Lipids/Hyperlipidemia:   Review of her recent lipid panel showed   controlled  LDL at 63 .  she  is advised to continue simvastatin 20 mg p.o. daily at bedtime.   Side effects and precautions discussed with her.     4)  Weight/Diet:  Body mass index is 35.63 kg/m.  -   clearly complicating her diabetes care.  She lost 6 pounds since last visit,  she is  a candidate for more weight loss. I discussed with her the fact that loss of 5 - 10% of her  current body weight will have the most impact on her diabetes management.  Exercise, and detailed carbohydrates information provided  -  detailed on discharge instructions.  5) Chronic Care/Health Maintenance:  -she   on ACEI/ARB and Statin medications and  is encouraged to initiate and continue to follow up with Ophthalmology, Dentist,  Podiatrist at least yearly or according to recommendations, and advised to   stay away from smoking. I have recommended yearly flu vaccine and pneumonia vaccine at least every 5 years; moderate intensity exercise for up to 150 minutes weekly; and  sleep for at least 7 hours a day.  - she is  advised to maintain close follow up with Rosita Fire, MD for primary care needs, as well as her other providers for optimal and coordinated care.   I spent 32 minutes in the care of the patient today including review of labs from [place], Lipids, Thyroid Function, Hematology (current and previous including abstractions from other facilities); face-to-face time discussing  her blood glucose readings/logs, discussing hypoglycemia and hyperglycemia episodes and symptoms, medications doses, her options of short and long term treatment based on the latest standards of care / guidelines;  discussion about incorporating lifestyle medicine;  and documenting the encounter.    Please refer to Patient Instructions for Blood Glucose Monitoring and Insulin/Medications Dosing Guide"  in media tab for additional information. Please  also refer to " Patient Self Inventory" in the Media  tab for reviewed elements of  pertinent patient history.  Everrett Coombe participated in the discussions, expressed understanding, and voiced agreement with the above plans.  All questions were answered to her satisfaction. she is encouraged to contact clinic should she have any questions or concerns prior to her return visit.   Follow up plan: - Return in about 4 months (around 08/25/2021) for F/U with Pre-visit Labs, Meter, Logs, A1c here.Glade Lloyd, MD [facility] [address] [place], [place] [address] Phone: [phone]  Fax: [phone]    04/25/2021, 9:11 AM  This note was partially dictated with voice recognition software. Similar sounding words can be transcribed inadequately or may not  be corrected upon review.

## 2021-04-25 NOTE — Patient Instructions (Signed)

## 2021-04-25 NOTE — Patient Instructions (Signed)
Goals Keep up the great job! Walk 30 minutes 4 times per week. Look up new recipes to try. Focus on plant based foods.

## 2021-05-13 DIAGNOSIS — E1165 Type 2 diabetes mellitus with hyperglycemia: Secondary | ICD-10-CM | POA: Diagnosis not present

## 2021-05-13 DIAGNOSIS — I10 Essential (primary) hypertension: Secondary | ICD-10-CM | POA: Diagnosis not present

## 2021-06-12 DIAGNOSIS — E1165 Type 2 diabetes mellitus with hyperglycemia: Secondary | ICD-10-CM | POA: Diagnosis not present

## 2021-06-12 DIAGNOSIS — I10 Essential (primary) hypertension: Secondary | ICD-10-CM | POA: Diagnosis not present

## 2021-07-04 DIAGNOSIS — Z23 Encounter for immunization: Secondary | ICD-10-CM | POA: Diagnosis not present

## 2021-07-13 DIAGNOSIS — E1165 Type 2 diabetes mellitus with hyperglycemia: Secondary | ICD-10-CM | POA: Diagnosis not present

## 2021-07-13 DIAGNOSIS — I10 Essential (primary) hypertension: Secondary | ICD-10-CM | POA: Diagnosis not present

## 2021-08-12 DIAGNOSIS — E1165 Type 2 diabetes mellitus with hyperglycemia: Secondary | ICD-10-CM | POA: Diagnosis not present

## 2021-08-12 DIAGNOSIS — I10 Essential (primary) hypertension: Secondary | ICD-10-CM | POA: Diagnosis not present

## 2021-08-16 IMAGING — MG MM DIGITAL SCREENING BILAT W/ TOMO AND CAD
8 of 15 series · 8 of 40 positions shown · non-contrast
Comparison: Previous exam(s).

CLINICAL DATA: Screening.

EXAM:
DIGITAL SCREENING BILATERAL MAMMOGRAM WITH TOMOSYNTHESIS AND CAD
TECHNIQUE: Bilateral screening digital craniocaudal and mediolateral oblique
mammograms were obtained. Bilateral screening digital breast
tomosynthesis was performed. The images were evaluated with
computer-aided detection.

[R CC synth-2D (1 of 2)]
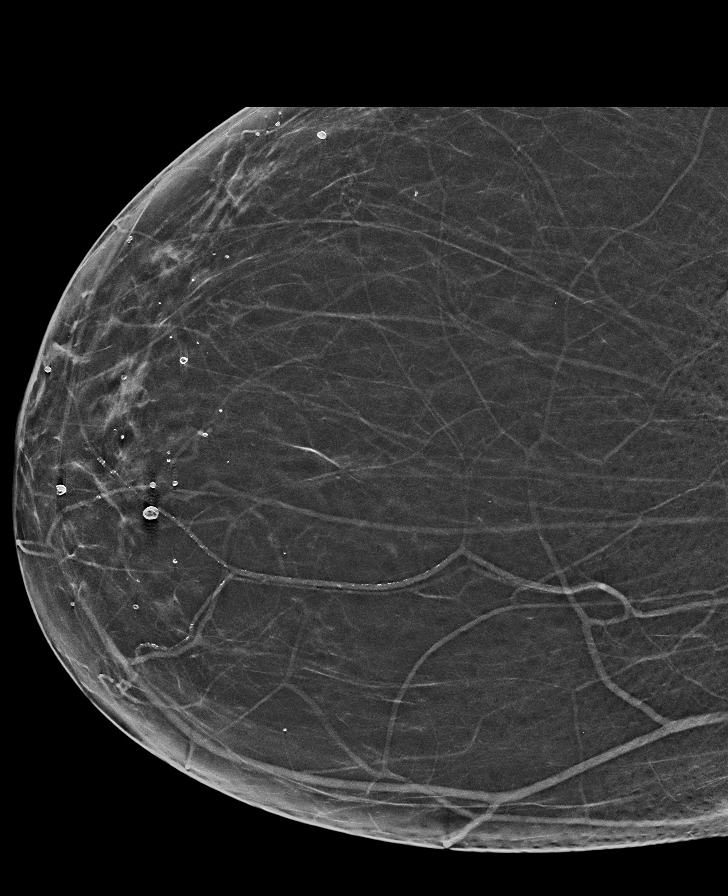

[L MLO synth-2D (1 of 2)]
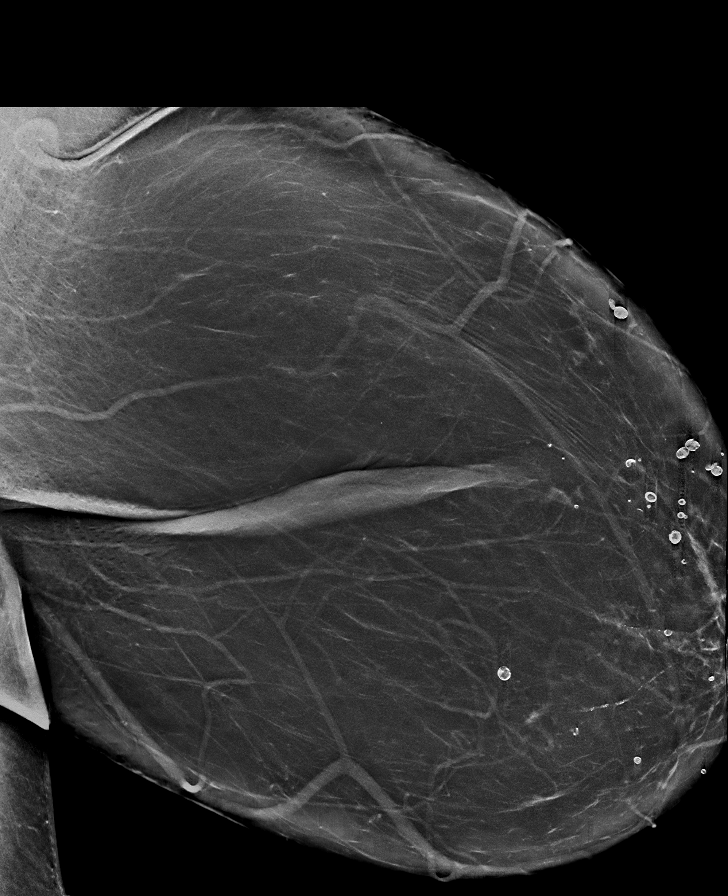

[R MLO synth-2D (1 of 2)]
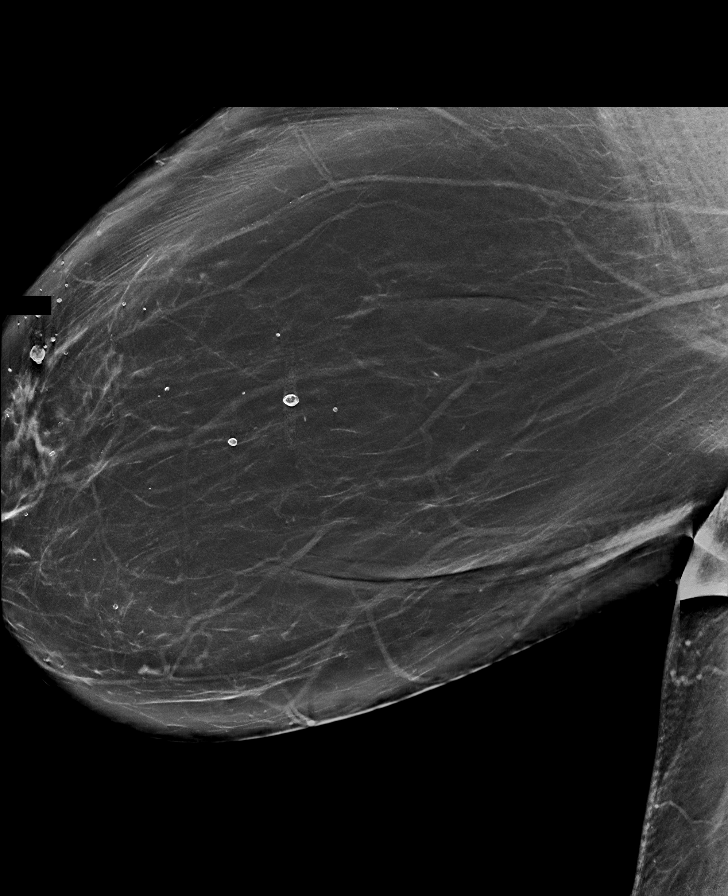

[R MLO synth-2D (2 of 2)]
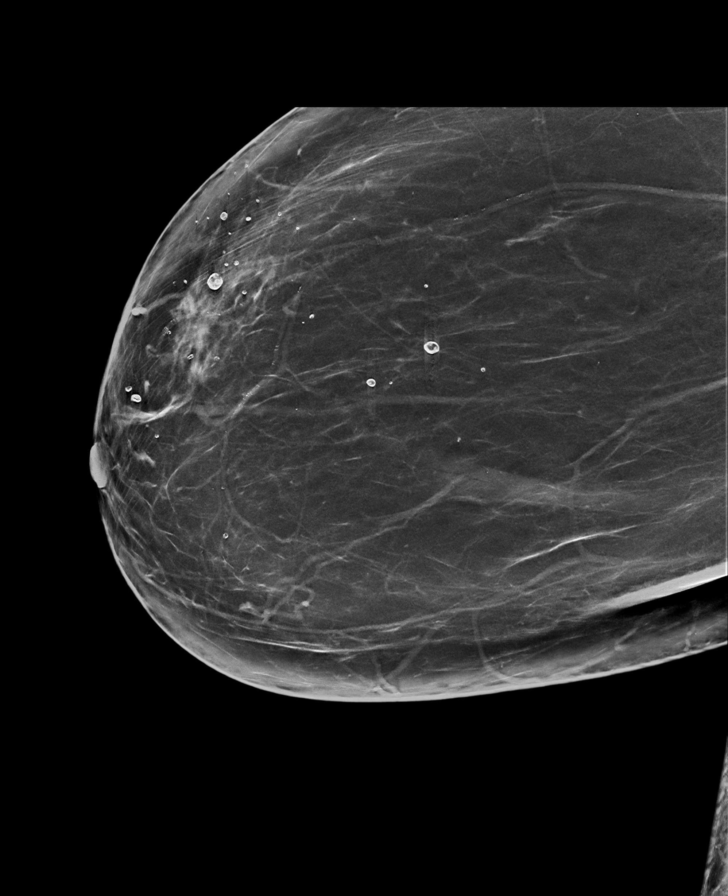

[L CC synth-2D]
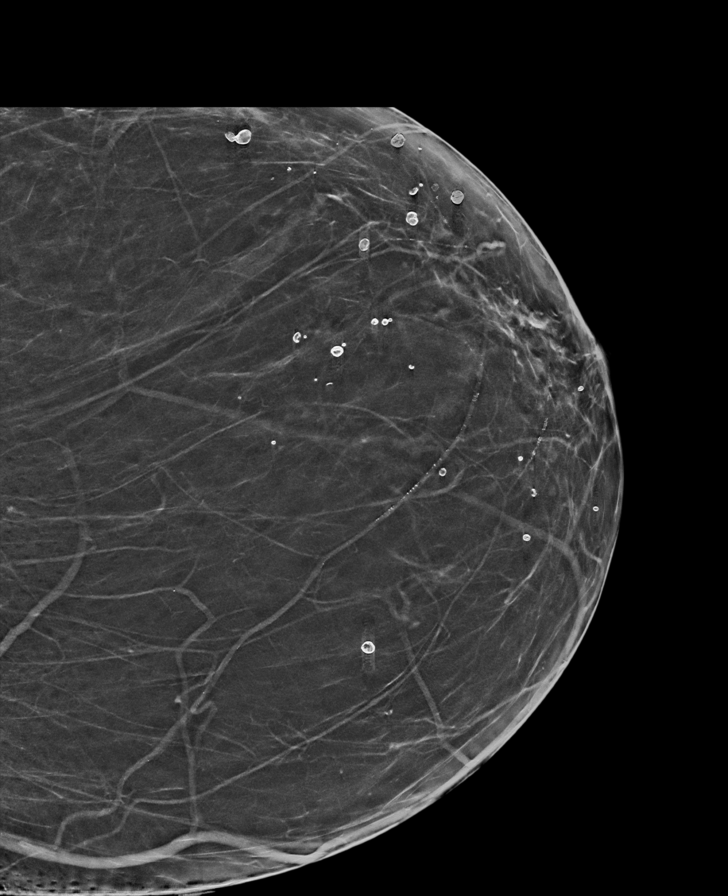

[L MLO synth-2D (2 of 2)]
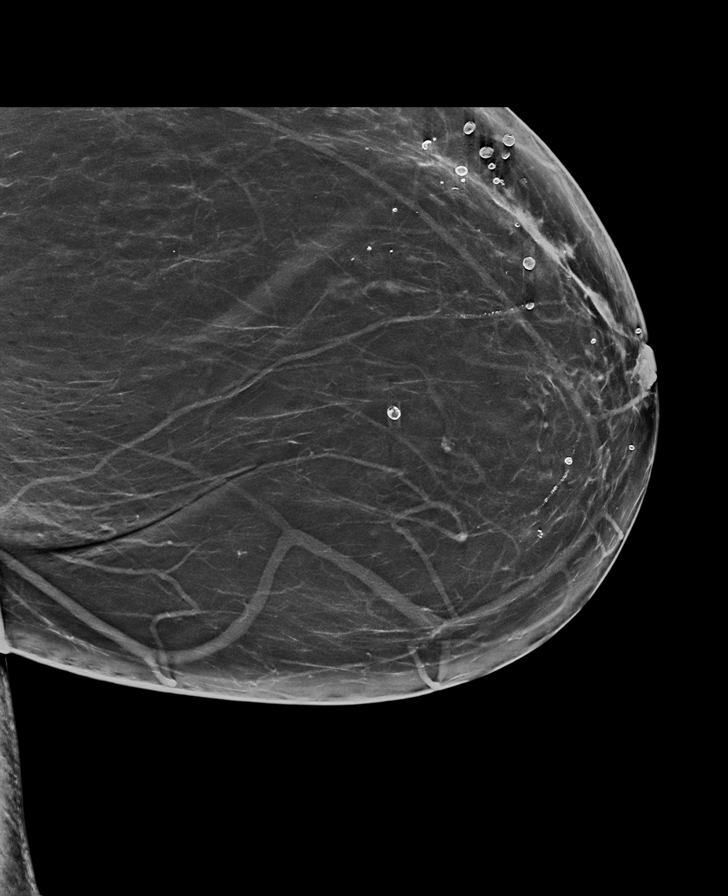

[R CC synth-2D (2 of 2)]
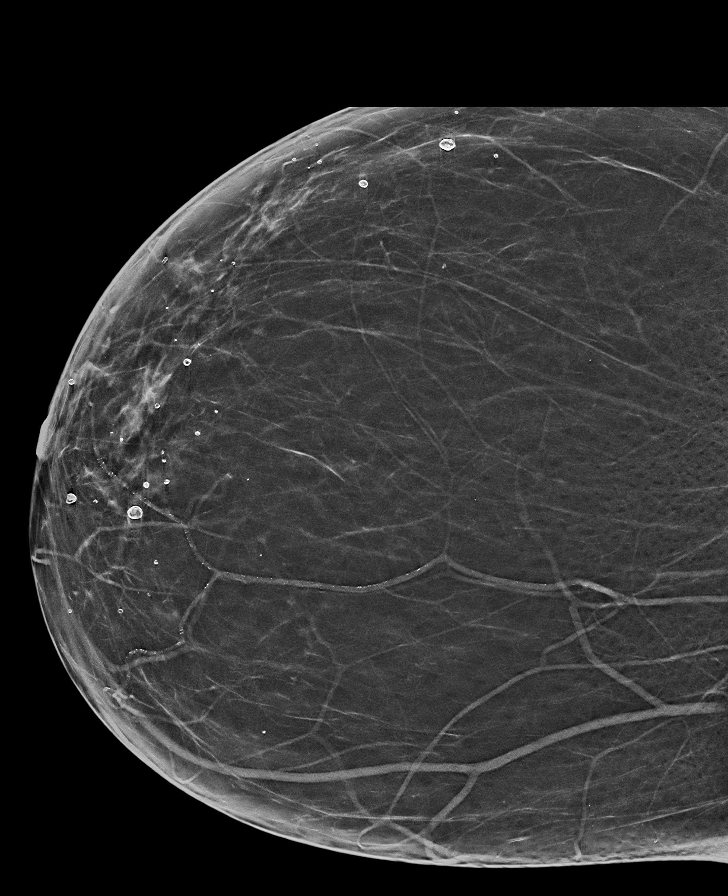

[R MLO tomo · tomo slice 59/86.0]
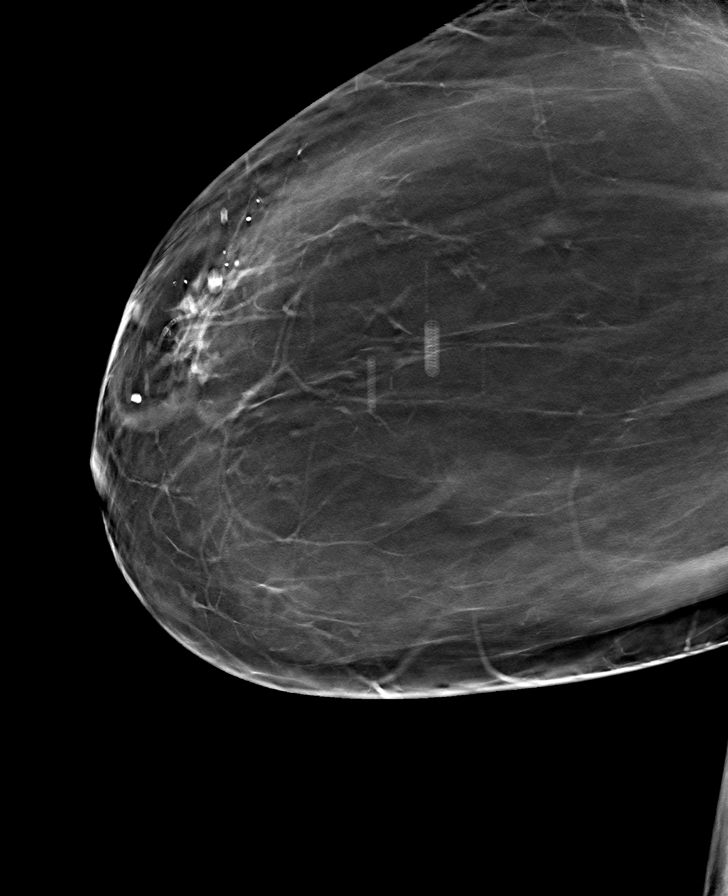

[8 of 40 positions shown; findings below may reference images not displayed]

ACR Breast Density Category b: There are scattered areas of
fibroglandular density.
FINDINGS: There are no findings suspicious for malignancy. The images were
evaluated with computer-aided detection.
IMPRESSION: No mammographic evidence of malignancy. A result letter of this
screening mammogram will be mailed directly to the patient.

RECOMMENDATION:
Screening mammogram in one year. (Code:WJ-I-BG6)

BI-RADS CATEGORY  1: Negative.

## 2021-08-29 DIAGNOSIS — E1165 Type 2 diabetes mellitus with hyperglycemia: Secondary | ICD-10-CM | POA: Diagnosis not present

## 2021-08-30 LAB — COMPREHENSIVE METABOLIC PANEL
ALT: 24 IU/L (ref 0–32)
AST: 23 IU/L (ref 0–40)
Albumin/Globulin Ratio: 1.5 (ref 1.2–2.2)
Albumin: 4.5 g/dL (ref 3.8–4.8)
Alkaline Phosphatase: 80 IU/L (ref 44–121)
BUN/Creatinine Ratio: 19 (ref 12–28)
BUN: 22 mg/dL (ref 8–27)
Bilirubin Total: 0.2 mg/dL (ref 0.0–1.2)
CO2: 25 mmol/L (ref 20–29)
Calcium: 10.2 mg/dL (ref 8.7–10.3)
Chloride: 99 mmol/L (ref 96–106)
Creatinine, Ser: 1.17 mg/dL — ABNORMAL HIGH (ref 0.57–1.00)
Globulin, Total: 3 g/dL (ref 1.5–4.5)
Glucose: 102 mg/dL — ABNORMAL HIGH (ref 70–99)
Potassium: 4.9 mmol/L (ref 3.5–5.2)
Sodium: 141 mmol/L (ref 134–144)
Total Protein: 7.5 g/dL (ref 6.0–8.5)
eGFR: 51 mL/min/{1.73_m2} — ABNORMAL LOW (ref >59)

## 2021-08-30 LAB — T4, FREE: Free T4: 1.1 ng/dL (ref 0.82–1.77)

## 2021-08-30 LAB — TSH: TSH: 1.57 u[IU]/mL (ref 0.450–4.500)

## 2021-09-05 ENCOUNTER — Encounter: Payer: Self-pay | Admitting: "Endocrinology

## 2021-09-05 ENCOUNTER — Other Ambulatory Visit: Payer: Self-pay

## 2021-09-05 ENCOUNTER — Ambulatory Visit (INDEPENDENT_AMBULATORY_CARE_PROVIDER_SITE_OTHER): Payer: Medicare Other | Admitting: "Endocrinology

## 2021-09-05 ENCOUNTER — Encounter: Payer: Self-pay | Admitting: Nutrition

## 2021-09-05 ENCOUNTER — Encounter: Payer: Medicare Other | Attending: "Endocrinology | Admitting: Nutrition

## 2021-09-05 VITALS — Ht 64.0 in | Wt 202.0 lb

## 2021-09-05 VITALS — BP 136/80 | HR 76 | Ht 64.0 in | Wt 202.8 lb

## 2021-09-05 DIAGNOSIS — I10 Essential (primary) hypertension: Secondary | ICD-10-CM

## 2021-09-05 DIAGNOSIS — E782 Mixed hyperlipidemia: Secondary | ICD-10-CM

## 2021-09-05 DIAGNOSIS — E1165 Type 2 diabetes mellitus with hyperglycemia: Secondary | ICD-10-CM | POA: Diagnosis not present

## 2021-09-05 DIAGNOSIS — E669 Obesity, unspecified: Secondary | ICD-10-CM | POA: Diagnosis present

## 2021-09-05 LAB — POCT GLYCOSYLATED HEMOGLOBIN (HGB A1C): HbA1c, POC (controlled diabetic range): 7.1 % — AB (ref 0.0–7.0)

## 2021-09-05 NOTE — Progress Notes (Signed)
09/05/2021, 12:14 PM   Endocrinology follow-up note  Subjective:    Patient ID: Mary Robinson, female    DOB: 11-29-1955.  Mary Robinson is being seen in follow-up after she was seen in consultation for management of currently uncontrolled symptomatic diabetes requested by  Rosita Fire, MD.   Past Medical History:  Diagnosis Date   Anemia    Anxiety    Arthritis    Asthma    Bursitis    Chronic back pain    Chronic constipation    Depression    Diastasis recti    Essential hypertension    GERD (gastroesophageal reflux disease)    Helicobacter pylori gastritis    Treated in May 2011   Hyperlipidemia    Obesity    S/P colonoscopy 12/2009   Multiple tubular adenomas: next TCS May 2014   S/P endoscopy 12/2009   Severe erosive reflux esophagitis, noncritial schatzki's ring, +h.pylori   Seasonal allergies    Sleep apnea    Type II diabetes mellitus ([place])    Umbilical hernia     Past Surgical History:  Procedure Laterality Date   CHOLECYSTECTOMY  1999   COLONOSCOPY  May 2011   Dr. Gala Romney: long, torturous colon, multiple colonic polyps. TUBULAR ADENOMAS   COLONOSCOPY N/A 04/15/2013   normal rectum, torturous colon.  tubular adenomas. Surveillance due 2019.    DILATION AND CURETTAGE OF UTERUS  1970's   ESOPHAGOGASTRODUODENOSCOPY  May 2011   Dr. Gala Romney: severe erosive reflux esophagitis, non-critical Schatzki's ring, diffuse gastric erosions. H. PYLORI GASTRITIS   HERNIA REPAIR  05/04/2013   laparoscopic incisional hernia repair w/mesh per notes(05/04/2013)   INCISIONAL HERNIA REPAIR N/A 05/04/2013   Procedure: LAPAROSCOPIC INCISIONAL HERNIA ;  Surgeon: Adin Hector, MD;  Location: [facility];  Service: General;  Laterality: N/A;   INSERTION OF MESH N/A 05/04/2013   Procedure: INSERTION OF MESH;  Surgeon: Adin Hector, MD;  Location: [facility];  Service: General;  Laterality: N/A;   TUBAL  LIGATION  1970's    Social History   Socioeconomic History   Marital status: Single    Spouse name: Not on file   Number of children: Not on file   Years of education: Not on file   Highest education level: Not on file  Occupational History   Occupation: retired  Tobacco Use   Smoking status: Never   Smokeless tobacco: Never  Vaping Use   Vaping Use: Never used  Substance and Sexual Activity   Alcohol use: No   Drug use: No   Sexual activity: Never    Birth control/protection: Post-menopausal  Other Topics Concern   Not on file  Social History Narrative   Not on file   Social Determinants of Health   Financial Resource Strain: Not on file  Food Insecurity: Not on file  Transportation Needs: Not on file  Physical Activity: Not on file  Stress: Not on file  Social Connections: Not on file    Family History  Problem Relation Age of Onset   Colon cancer Maternal Uncle    Colon cancer Paternal Uncle  COPD Mother    Hypertension Mother    Seizures Mother    Chronic Renal Failure Mother    Hyperlipidemia Mother    Stroke Mother    Kidney disease Mother    Heart failure Mother    Heart disease Father    Diabetes Sister    Hypertension Sister    Heart disease Sister    Diabetes Brother    Hypertension Brother    Hypertension Daughter    Hypertension Son    Diabetes Sister    Hypertension Sister    Diabetes Sister    Hypertension Sister     Outpatient Encounter Medications as of 09/05/2021  Medication Sig   acetaminophen (TYLENOL) 500 MG tablet Take 500-1,000 mg by mouth every 6 (six) hours as needed for moderate pain.    Albuterol Sulfate, sensor, (PROAIR DIGIHALER) 108 (90 Base) MCG/ACT AEPB Inhale into the lungs as needed.   amLODipine (NORVASC) 10 MG tablet Take 10 mg by mouth daily with breakfast.    aspirin 81 MG tablet Take 81 mg by mouth daily.     glucose blood (ACCU-CHEK AVIVA PLUS) test strip Test glucose 2 times a day   hydrochlorothiazide  (HYDRODIURIL) 25 MG tablet Take 25 mg by mouth daily.   loratadine (CLARITIN) 10 MG tablet Take 10 mg by mouth daily.     magnesium oxide (MAG-OX) 400 MG tablet Take 400 mg by mouth daily.    metFORMIN (GLUCOPHAGE) 1000 MG tablet Take 1,000 mg by mouth 2 (two) times daily.   omeprazole (PRILOSEC) 20 MG capsule Take 20 mg by mouth daily with breakfast.    polyethylene glycol (MIRALAX / GLYCOLAX) packet Take 17 g by mouth daily as needed (for constipation).   simvastatin (ZOCOR) 20 MG tablet Take 20 mg by mouth daily.   spironolactone (ALDACTONE) 25 MG tablet Take 50 mg by mouth once.   Vitamin D, Ergocalciferol, (DRISDOL) 1.25 MG (50000 UNIT) CAPS capsule Take 50,000 Units by mouth every 30 (thirty) days.   No facility-administered encounter medications on file as of 09/05/2021.    ALLERGIES: No Known Allergies  VACCINATION STATUS: Immunization History  Administered Date(s) Administered   Influenza-Unspecified 06/29/2014, 07/03/2018    Diabetes She presents for her follow-up diabetic visit. She has type 2 diabetes mellitus. Her disease course has been improving. There are no hypoglycemic associated symptoms. Pertinent negatives for hypoglycemia include no confusion, headaches, pallor or seizures. Pertinent negatives for diabetes include no chest pain, no fatigue, no polydipsia, no polyphagia and no polyuria. There are no hypoglycemic complications. Symptoms are improving. Risk factors for coronary artery disease include dyslipidemia, diabetes mellitus, family history, obesity, hypertension, sedentary lifestyle and post-menopausal. Current diabetic treatments: She is currently on Metformin 1000 mg p.o. twice daily, Tradjenta 5 mg p.o. daily. Her weight is decreasing steadily. She is following a generally unhealthy diet. When asked about meal planning, she reported none. She has not had a previous visit with a dietitian. She rarely participates in exercise. Her home blood glucose trend is  decreasing steadily. Her breakfast blood glucose range is generally 130-140 mg/dl. Her overall blood glucose range is 140-180 mg/dl. Mary Robinson presents with continued improvement in her glycemic profile to near target levels.  Her point-of-care A1c 7.1% generally improving from 9.3%.  She did not document any hypoglycemia.  Her average blood glucose is between 150-165 over the last 30 days.   ) An ACE inhibitor/angiotensin II receptor blocker is being taken. She does not see a podiatrist.Eye exam is current.  Hyperlipidemia This is a chronic problem. The current episode started more than 1 year ago. The problem is uncontrolled. Exacerbating diseases include diabetes and obesity. Pertinent negatives include no chest pain, myalgias or shortness of breath. Current antihyperlipidemic treatment includes statins. Risk factors for coronary artery disease include diabetes mellitus, dyslipidemia, hypertension, a sedentary lifestyle, post-menopausal, obesity and family history.  Hypertension This is a chronic problem. The current episode started more than 1 year ago. The problem is controlled. Pertinent negatives include no chest pain, headaches, palpitations or shortness of breath. Risk factors for coronary artery disease include dyslipidemia, diabetes mellitus, obesity, sedentary lifestyle, family history and post-menopausal state. Past treatments include angiotensin blockers and calcium channel blockers.    Review of Systems  Constitutional:  Negative for chills, fatigue, fever and unexpected weight change.  HENT:  Negative for trouble swallowing and voice change.   Eyes:  Negative for visual disturbance.  Respiratory:  Negative for cough, shortness of breath and wheezing.   Cardiovascular:  Negative for chest pain, palpitations and leg swelling.  Gastrointestinal:  Negative for diarrhea, nausea and vomiting.  Endocrine: Negative for cold intolerance, heat intolerance, polydipsia, polyphagia and polyuria.   Musculoskeletal:  Negative for arthralgias and myalgias.  Skin:  Negative for color change, pallor, rash and wound.  Neurological:  Negative for seizures and headaches.  Psychiatric/Behavioral:  Negative for confusion and suicidal ideas.    Objective:    Vitals with BMI 09/05/2021 09/05/2021 04/25/2021  Height 5\' 4"  5\' 4"  5\' 4"   Weight 202 lbs 202 lbs 13 oz 207 lbs  BMI 34.66 29.79 89.21  Systolic - 194 -  Diastolic - 80 -  Pulse - 76 -    BP 136/80    Pulse 76    Ht 5\' 4"  (1.626 m)    Wt 202 lb 12.8 oz (92 kg)    BMI 34.81 kg/m   Wt Readings from Last 3 Encounters:  09/05/21 202 lb (91.6 kg)  09/05/21 202 lb 12.8 oz (92 kg)  04/25/21 207 lb (93.9 kg)     Physical Exam Constitutional:      Appearance: She is well-developed.  HENT:     Head: Normocephalic and atraumatic.  Neck:     Thyroid: No thyromegaly.     Trachea: No tracheal deviation.  Cardiovascular:     Rate and Rhythm: Normal rate.  Pulmonary:     Effort: Pulmonary effort is normal.  Abdominal:     General: Bowel sounds are normal.     Palpations: Abdomen is soft.     Tenderness: There is no abdominal tenderness. There is no guarding.  Musculoskeletal:        General: Normal range of motion.     Cervical back: Normal range of motion and neck supple.  Skin:    General: Skin is warm and dry.     Coloration: Skin is not pale.     Findings: No erythema or rash.  Neurological:     General: No focal deficit present.     Mental Status: She is alert and oriented to person, place, and time.     Cranial Nerves: No cranial nerve deficit.     Coordination: Coordination normal.     Deep Tendon Reflexes: Reflexes are normal and symmetric.     Comments: Normal monofilament sensation test on bilateral lower extremities.    Psychiatric:        Judgment: Judgment normal.      CMP ( most recent) CMP  Component Value Date/Time   NA 141 08/29/2021 1059   K 4.9 08/29/2021 1059   CL 99 08/29/2021 1059   CO2 25  08/29/2021 1059   GLUCOSE 102 (H) 08/29/2021 1059   GLUCOSE 152 (H) 11/09/2019 1020   BUN 22 08/29/2021 1059   CREATININE 1.17 (H) 08/29/2021 1059   CREATININE 1.05 (H) 11/09/2019 1020   CALCIUM 10.2 08/29/2021 1059   PROT 7.5 08/29/2021 1059   ALBUMIN 4.5 08/29/2021 1059   AST 23 08/29/2021 1059   ALT 24 08/29/2021 1059   ALKPHOS 80 08/29/2021 1059   BILITOT 0.2 08/29/2021 1059   GFRNONAA 59 10/10/2020 0000   GFRNONAA 55 (L) 11/09/2019 1020   GFRAA 69 10/10/2020 0000   GFRAA 64 11/09/2019 1020     Diabetic Labs (most recent): Lab Results  Component Value Date   HGBA1C 7.1 (A) 09/05/2021   HGBA1C 7.7 (A) 01/11/2021   HGBA1C 9.3 10/10/2020     Lipid Panel ( most recent) Lipid Panel     Component Value Date/Time   CHOL 139 10/10/2020 0000   TRIG 190 (A) 10/10/2020 0000   HDL 50 10/10/2020 0000   LDLCALC 63 10/10/2020 0000      Assessment & Plan:   1. Uncontrolled type 2 diabetes mellitus with hyperglycemia ([place]) - Mary Robinson has currently uncontrolled symptomatic type 2 DM since  66 years of age.  Mary Robinson presents with continued improvement in her glycemic profile to near target levels.  Her point-of-care A1c 7.1% generally improving from 9.3%.  She did not document any hypoglycemia.  Her average blood glucose is between 150-165 over the last 30 days.    -Recent labs reviewed. - I had a long discussion with her about the progressive nature of diabetes and the pathology behind its complications. -her diabetes is complicated by obesity/sedentary life and she remains at a high risk for more acute and chronic complications which include CAD, CVA, CKD, retinopathy, and neuropathy. These are all discussed in detail with her.  - I have counseled her on diet  and weight management  by adopting a carbohydrate restricted/protein rich diet. Patient is encouraged to switch to  unprocessed or minimally processed     complex starch and increased protein intake (animal or  plant source), fruits, and vegetables. -  she is advised to stick to a routine mealtimes to eat 3 meals  a day and avoid unnecessary snacks ( to snack only to correct hypoglycemia).   - she acknowledges that there is a room for improvement in her food and drink choices. - Suggestion is made for her to avoid simple carbohydrates  from her diet including Cakes, Sweet Desserts, Ice Cream, Soda (diet and regular), Sweet Tea, Candies, Chips, Cookies, Store Bought Juices, Alcohol , Artificial Sweeteners,  Coffee Creamer, and "Sugar-free" Products, Lemonade. This will help patient to have more stable blood glucose profile and potentially avoid unintended weight gain.  The following Lifestyle Medicine recommendations according to [facility]) were discussed and and offered to patient and she  agrees to start the journey:  A. Whole Foods, Plant-Based Nutrition comprising of fruits and vegetables, plant-based proteins, whole-grain carbohydrates was discussed in detail with the patient.   A list for source of those nutrients were also provided to the patient.  Patient will use only water or unsweetened tea for hydration. B.  The need to stay away from risky substances including alcohol, smoking; obtaining 7 to 9 hours of restorative sleep,  at least 150 minutes of moderate intensity exercise weekly, the importance of healthy social connections,  and stress management techniques were discussed. C.  A full color page of  Calorie density of various food groups per pound showing examples of each food groups was provided to the patient.    - she has been scheduled with Jearld Fenton, RDN, CDE for diabetes education.  - I have approached her with the following individualized plan to manage  her diabetes and patient agrees:   -Based on her response to treatment, she will not need insulin treatment for now.  She did not tolerate Rybelsus.  -In light of her presentation with target  glycemic profile, she will only continue on metformin 1000 mg p.o. twice daily- daily after breakfast and after supper.  She is advised to continue monitor blood glucose at least once a day-daily before breakfast.    - she is encouraged to call clinic for blood glucose levels less than 70 or above 200 mg per DL at fasting .   - Specific targets for  A1c;  LDL, HDL,  and Triglycerides were discussed with the patient.  2) Blood Pressure /Hypertension: Her blood pressure is controlled to target. she is advised to continue her current medications including amlodipine 10 mg p.o. daily, losartan 100 mg p.o. daily with breakfast.     3) Lipids/Hyperlipidemia:   Review of her recent lipid panel showed  controlled  LDL at 63 .  she  is advised to continue simvastatin 20 mg p.o. daily at bedtime.    Side effects and precautions discussed with her.     4)  Weight/Diet:  Body mass index is 34.81 kg/m.  -   clearly complicating her diabetes care.  She lost 6 pounds since last visit,  she is  a candidate for more weight loss. I discussed with her the fact that loss of 5 - 10% of her  current body weight will have the most impact on her diabetes management.  Exercise, and detailed carbohydrates information provided  -  detailed on discharge instructions.  5) Chronic Care/Health Maintenance:  -she   on ACEI/ARB and Statin medications and  is encouraged to initiate and continue to follow up with Ophthalmology, Dentist,  Podiatrist at least yearly or according to recommendations, and advised to   stay away from smoking. I have recommended yearly flu vaccine and pneumonia vaccine at least every 5 years; moderate intensity exercise for up to 150 minutes weekly; and  sleep for at least 7 hours a day.  - she is  advised to maintain close follow up with Rosita Fire, MD for primary care needs, as well as her other providers for optimal and coordinated care.   I spent 32 minutes in the care of the patient today  including review of labs from [place], Lipids, Thyroid Function, Hematology (current and previous including abstractions from other facilities); face-to-face time discussing  her blood glucose readings/logs, discussing hypoglycemia and hyperglycemia episodes and symptoms, medications doses, her options of short and long term treatment based on the latest standards of care / guidelines;  discussion about incorporating lifestyle medicine;  and documenting the encounter.    Please refer to Patient Instructions for Blood Glucose Monitoring and Insulin/Medications Dosing Guide"  in media tab for additional information. Please  also refer to " Patient Self Inventory" in the Media  tab for reviewed elements of pertinent patient history.  Mary Robinson participated in the discussions, expressed understanding, and voiced agreement with  the above plans.  All questions were answered to her satisfaction. she is encouraged to contact clinic should she have any questions or concerns prior to her return visit.    Follow up plan: - Return in about 4 months (around 01/03/2022) for Bring Meter and Logs- A1c in Office.  Glade Lloyd, MD [facility] [address] [place], [place] [address] Phone: [phone]  Fax: [phone]    09/05/2021, 12:14 PM  This note was partially dictated with voice recognition software. Similar sounding words can be transcribed inadequately or may not  be corrected upon review.

## 2021-09-05 NOTE — Patient Instructions (Addendum)
Goals  Exercise 15 minutes a day. Get back to start going to [facility]. Increase water intake 3 bottles per day. Keep up the great job. Get A1C 6.5% Don't skip meals Focus more on plant based foods- fruits, vegetables, dried beans and whole grains. Lifestyle Medicine - Whole Food, Plant Predominant Nutrition is highly recommended: Eat Plenty of vegetables, Mushrooms, fruits, Legumes, Whole Grains, Nuts, seeds in lieu of processed meats, processed snacks/pastries red meat, poultry, eggs.    -It is better to avoid simple carbohydrates including: Cakes, Sweet Desserts, Ice Cream, Soda (diet and regular), Sweet Tea, Candies, Chips, Cookies, Store Bought Juices, Alcohol in Excess of  1-2 drinks a day, Lemonade,  Artificial Sweeteners, Doughnuts, Coffee Creamers, "Sugar-free" Products, etc, etc.  This is not a complete list.....  Exercise: If you are able: 30 -60 minutes a day ,4 days a week, or 150 minutes a week.  The longer the better.  Combine stretch, strength, and aerobic activities.  If you were told in the past that you have high risk for cardiovascular diseases, you may seek evaluation by your heart doctor prior to initiating moderate to intense exercise programs.

## 2021-09-05 NOTE — Progress Notes (Signed)
Medical Nutrition Therapy  Dm and Obesity follow up 0915 eND  A1C 7.1%. Lost 5 lbs Has improved by going to bed earlier.  Changes made:  Got sick after working out at [employer] doing aerobics and swimming. No low blood sugars. So is just now walking for exercise.  Cut out snacks, processed foods. has more energy. Drinking water and eating a lot more vegetables and fresh fruits. Being a lot more active. Feels a lot better. Lost  11 lbs since last visit. Has lost a total of 21 lbs. Has been eating more plant based foods and avoiding processed foods   BS before bed 170's,  In am 130--140's. Feels much better. A1C down to 7.2% from 7.7% BP has improved, weight has improved since she is eating better and exercising.  Anthropometrics    Wt Readings from Last 3 Encounters:  09/05/21 202 lb 12.8 oz (92 kg)  04/25/21 207 lb (93.9 kg)  04/25/21 207 lb 9.6 oz (94.2 kg)   Ht Readings from Last 3 Encounters:  09/05/21 5\' 4"  (1.626 m)  04/25/21 5\' 4"  (1.626 m)  04/25/21 5\' 4"  (1.626 m)   There is no height or weight on file to calculate BMI. @BMIFA @ Facility age limit for growth percentiles is 20 years. Facility age limit for growth percentiles is 20 years.   Clinical Medical Hx: GERD, Type 2 DM, HTN Medications: Metformin 1000 BID,  Labs:   Lab Results  Component Value Date   HGBA1C 7.1 (A) 09/05/2021   FBS110's's.     Notable Signs/Symptoms:Feeling much better. Better sleep, more energy. Not hungry to graze or snack.  Lifestyle & Dietary Hx Lives by herself. Struggles with affording food that she needs to eat. Her stress is a lot better. She bought a house in [place]   Estimated daily fluid intake: 32  oz Supplements: Vit D Sleep: varies Stress / self-care: reduced since eating better Current average weekly physical activity: going to get back to walking  24-Hr Dietary Recall First Meal: raisin bran cereal or cherrios with egg or oatmeal Snack: water Second Meal: Toss salad,  or sometimes skips.  Third Meal: Cereal, cucumber, potato salad, water Beverages: water,    Estimated Energy Needs Calories: 1200 Carbohydrate: 135g Protein: 90g Fat: 33g   NUTRITION DIAGNOSIS  NB-1.1 Food and nutrition-related knowledge deficit As related to Diabetes Type 2.  As evidenced by A1C 9.3%.   NUTRITION INTERVENTION  Nutrition education (E-1) on the following topics:  Nutrition and Diabetes education provided on My Plate, CHO counting, meal planning, portion sizes, timing of meals, avoiding snacks between meals unless having a low blood sugar, target ranges for A1C and blood sugars, signs/symptoms and treatment of hyper/hypoglycemia, monitoring blood sugars, taking medications as prescribed, benefits of exercising 30 minutes per day and prevention of complications of DM.  Lifestyle Medicine - Whole Food, Plant Predominant Nutrition is highly recommended: Eat Plenty of vegetables, Mushrooms, fruits, Legumes, Whole Grains, Nuts, seeds in lieu of processed meats, processed snacks/pastries red meat, poultry, eggs.    -It is better to avoid simple carbohydrates including: Cakes, Sweet Desserts, Ice Cream, Soda (diet and regular), Sweet Tea, Candies, Chips, Cookies, Store Bought Juices, Alcohol in Excess of  1-2 drinks a day, Lemonade,  Artificial Sweeteners, Doughnuts, Coffee Creamers, "Sugar-free" Products, etc, etc.  This is not a complete list.....  Exercise: If you are able: 30 -60 minutes a day ,4 days a week, or 150 minutes a week.  The longer the better.  Combine  stretch, strength, and aerobic activities.  If you were told in the past that you have high risk for cardiovascular diseases, you may seek evaluation by your heart doctor prior to initiating moderate to intense exercise programs.  Handouts Provided Include  Plant based meals plan Nutrient density of foods.   Learning Style & Readiness for Change Teaching method utilized: Visual & Auditory  Demonstrated degree  of understanding via: Teach Back  Barriers to learning/adherence to lifestyle change: none   Goals  Exercise 15 minutes a day. Get back to start going to [facility]. Increase water intake 3 bottles per day. Keep up the great job. Get A1C 6.5% Don't skip meals Focus more on plant based foods- fruits, vegetables, dried beans and whole grains. Lifestyle Medicine - Whole Food, Plant Predominant Nutrition is highly recommended: Eat Plenty of vegetables, Mushrooms, fruits, Legumes, Whole Grains, Nuts, seeds in lieu of processed meats, processed snacks/pastries red meat, poultry, eggs.    -It is better to avoid simple carbohydrates including: Cakes, Sweet Desserts, Ice Cream, Soda (diet and regular), Sweet Tea, Candies, Chips, Cookies, Store Bought Juices, Alcohol in Excess of  1-2 drinks a day, Lemonade,  Artificial Sweeteners, Doughnuts, Coffee Creamers, "Sugar-free" Products, etc, etc.  This is not a complete list.....  Exercise: If you are able: 30 -60 minutes a day ,4 days a week, or 150 minutes a week.  The longer the better.  Combine stretch, strength, and aerobic activities.  If you were told in the past that you have high risk for cardiovascular diseases, you may seek evaluation by your heart doctor prior to initiating moderate to intense exercise programs.  MONITORING & EVALUATION Dietary intake, weekly physical activity, and blood sugars in 4 month.  Next Steps  Keep up the great job and increase exercise as tolerated.

## 2021-09-05 NOTE — Patient Instructions (Signed)

## 2021-09-12 DIAGNOSIS — E1165 Type 2 diabetes mellitus with hyperglycemia: Secondary | ICD-10-CM | POA: Diagnosis not present

## 2021-09-12 DIAGNOSIS — I10 Essential (primary) hypertension: Secondary | ICD-10-CM | POA: Diagnosis not present

## 2021-10-04 DIAGNOSIS — Z23 Encounter for immunization: Secondary | ICD-10-CM | POA: Diagnosis not present

## 2021-10-04 DIAGNOSIS — E1165 Type 2 diabetes mellitus with hyperglycemia: Secondary | ICD-10-CM | POA: Diagnosis not present

## 2021-10-04 DIAGNOSIS — K219 Gastro-esophageal reflux disease without esophagitis: Secondary | ICD-10-CM | POA: Diagnosis not present

## 2021-10-04 DIAGNOSIS — I10 Essential (primary) hypertension: Secondary | ICD-10-CM | POA: Diagnosis not present

## 2021-10-04 DIAGNOSIS — Z0001 Encounter for general adult medical examination with abnormal findings: Secondary | ICD-10-CM | POA: Diagnosis not present

## 2021-10-04 DIAGNOSIS — Z1389 Encounter for screening for other disorder: Secondary | ICD-10-CM | POA: Diagnosis not present

## 2021-10-08 DIAGNOSIS — Z0001 Encounter for general adult medical examination with abnormal findings: Secondary | ICD-10-CM | POA: Diagnosis not present

## 2021-10-08 DIAGNOSIS — E559 Vitamin D deficiency, unspecified: Secondary | ICD-10-CM | POA: Diagnosis not present

## 2021-10-08 DIAGNOSIS — E1165 Type 2 diabetes mellitus with hyperglycemia: Secondary | ICD-10-CM | POA: Diagnosis not present

## 2021-10-08 DIAGNOSIS — I10 Essential (primary) hypertension: Secondary | ICD-10-CM | POA: Diagnosis not present

## 2021-11-01 DIAGNOSIS — E1165 Type 2 diabetes mellitus with hyperglycemia: Secondary | ICD-10-CM | POA: Diagnosis not present

## 2021-11-01 DIAGNOSIS — I10 Essential (primary) hypertension: Secondary | ICD-10-CM | POA: Diagnosis not present

## 2021-11-28 ENCOUNTER — Other Ambulatory Visit: Payer: Self-pay

## 2021-11-28 DIAGNOSIS — E1165 Type 2 diabetes mellitus with hyperglycemia: Secondary | ICD-10-CM

## 2021-12-02 DIAGNOSIS — E1165 Type 2 diabetes mellitus with hyperglycemia: Secondary | ICD-10-CM | POA: Diagnosis not present

## 2021-12-02 DIAGNOSIS — I10 Essential (primary) hypertension: Secondary | ICD-10-CM | POA: Diagnosis not present

## 2021-12-03 ENCOUNTER — Other Ambulatory Visit (HOSPITAL_COMMUNITY): Payer: Self-pay | Admitting: Internal Medicine

## 2021-12-03 DIAGNOSIS — Z1231 Encounter for screening mammogram for malignant neoplasm of breast: Secondary | ICD-10-CM

## 2022-01-01 DIAGNOSIS — E1165 Type 2 diabetes mellitus with hyperglycemia: Secondary | ICD-10-CM | POA: Diagnosis not present

## 2022-01-01 DIAGNOSIS — I10 Essential (primary) hypertension: Secondary | ICD-10-CM | POA: Diagnosis not present

## 2022-01-03 ENCOUNTER — Ambulatory Visit: Payer: Medicare Other | Admitting: Nutrition

## 2022-01-03 ENCOUNTER — Ambulatory Visit: Payer: Medicare Other | Admitting: "Endocrinology

## 2022-01-08 ENCOUNTER — Ambulatory Visit (INDEPENDENT_AMBULATORY_CARE_PROVIDER_SITE_OTHER): Payer: Medicare Other | Admitting: "Endocrinology

## 2022-01-08 ENCOUNTER — Encounter: Payer: Self-pay | Admitting: "Endocrinology

## 2022-01-08 VITALS — BP 126/78 | HR 80 | Ht 64.0 in | Wt 199.8 lb

## 2022-01-08 DIAGNOSIS — I10 Essential (primary) hypertension: Secondary | ICD-10-CM | POA: Diagnosis not present

## 2022-01-08 DIAGNOSIS — E1165 Type 2 diabetes mellitus with hyperglycemia: Secondary | ICD-10-CM | POA: Diagnosis not present

## 2022-01-08 DIAGNOSIS — Z6834 Body mass index (BMI) 34.0-34.9, adult: Secondary | ICD-10-CM

## 2022-01-08 DIAGNOSIS — E782 Mixed hyperlipidemia: Secondary | ICD-10-CM | POA: Diagnosis not present

## 2022-01-08 DIAGNOSIS — E559 Vitamin D deficiency, unspecified: Secondary | ICD-10-CM

## 2022-01-08 DIAGNOSIS — E6609 Other obesity due to excess calories: Secondary | ICD-10-CM | POA: Insufficient documentation

## 2022-01-08 LAB — POCT GLYCOSYLATED HEMOGLOBIN (HGB A1C): HbA1c, POC (controlled diabetic range): 6.8 % (ref 0.0–7.0)

## 2022-01-08 MED ORDER — ACCU-CHEK GUIDE VI STRP: ORAL_STRIP | 2 refills | Status: DC

## 2022-01-08 MED ORDER — ACCU-CHEK GUIDE ME W/DEVICE KIT: 1.0000 | PACK | 0 refills | Status: AC

## 2022-01-08 MED ORDER — ACCU-CHEK GUIDE ME W/DEVICE KIT: 1.0000 | PACK | 0 refills | Status: DC

## 2022-01-08 NOTE — Progress Notes (Signed)
? ?                                                         ?     01/08/2022, 11:42 AM ? ? ?Endocrinology follow-up note ? ?Subjective:  ? ? Patient ID: Mary Robinson, female    DOB: 1955-09-22.  ?Mary Robinson is being seen in follow-up after she was seen in consultation for management of currently uncontrolled symptomatic diabetes requested by  Carrolyn Meiers, MD. ? ? ?Past Medical History:  ?Diagnosis Date  ? Anemia   ? Anxiety   ? Arthritis   ? Asthma   ? Bursitis   ? Chronic back pain   ? Chronic constipation   ? Depression   ? Diastasis recti   ? Essential hypertension   ? GERD (gastroesophageal reflux disease)   ? Helicobacter pylori gastritis   ? Treated in May 2011  ? Hyperlipidemia   ? Obesity   ? S/P colonoscopy 12/2009  ? Multiple tubular adenomas: next TCS May 2014  ? S/P endoscopy 12/2009  ? Severe erosive reflux esophagitis, noncritial schatzki's ring, +h.pylori  ? Seasonal allergies   ? Sleep apnea   ? Type II diabetes mellitus ([place])   ? Umbilical hernia   ? ? ?Past Surgical History:  ?Procedure Laterality Date  ? CHOLECYSTECTOMY  1999  ? COLONOSCOPY  May 2011  ? Dr. Gala Romney: long, torturous colon, multiple colonic polyps. TUBULAR ADENOMAS  ? COLONOSCOPY N/A 04/15/2013  ? normal rectum, torturous colon.  tubular adenomas. Surveillance due 2019.   ? DILATION AND CURETTAGE OF UTERUS  1970's  ? ESOPHAGOGASTRODUODENOSCOPY  May 2011  ? Dr. Gala Romney: severe erosive reflux esophagitis, non-critical Schatzki's ring, diffuse gastric erosions. H. PYLORI GASTRITIS  ? HERNIA REPAIR  05/04/2013  ? laparoscopic incisional hernia repair w/mesh per notes(05/04/2013)  ? INCISIONAL HERNIA REPAIR N/A 05/04/2013  ? Procedure: LAPAROSCOPIC INCISIONAL HERNIA ;  Surgeon: Adin Hector, MD;  Location: [facility];  Service: General;  Laterality: N/A;  ? INSERTION OF MESH N/A 05/04/2013  ? Procedure: INSERTION OF MESH;  Surgeon: Adin Hector, MD;  Location: [facility];  Service: General;  Laterality: N/A;  ?  TUBAL LIGATION  1970's  ? ? ?Social History  ? ?Socioeconomic History  ? Marital status: Single  ?  Spouse name: Not on file  ? Number of children: Not on file  ? Years of education: Not on file  ? Highest education level: Not on file  ?Occupational History  ? Occupation: retired  ?Tobacco Use  ? Smoking status: Never  ? Smokeless tobacco: Never  ?Vaping Use  ? Vaping Use: Never used  ?Substance and Sexual Activity  ? Alcohol use: No  ? Drug use: No  ? Sexual activity: Never  ?  Birth control/protection: Post-menopausal  ?Other Topics Concern  ? Not on file  ?Social History Narrative  ? Not on file  ? ?Social Determinants of Health  ? ?Financial Resource Strain: Not on file  ?Food Insecurity: Not on file  ?Transportation Needs: Not on file  ?Physical Activity: Not on file  ?Stress: Not on file  ?Social Connections: Not on file  ? ? ?Family History  ?Problem Relation Age of Onset  ? Colon cancer Maternal Uncle   ? Colon cancer Paternal Uncle   ?  COPD Mother   ? Hypertension Mother   ? Seizures Mother   ? Chronic Renal Failure Mother   ? Hyperlipidemia Mother   ? Stroke Mother   ? Kidney disease Mother   ? Heart failure Mother   ? Heart disease Father   ? Diabetes Sister   ? Hypertension Sister   ? Heart disease Sister   ? Diabetes Brother   ? Hypertension Brother   ? Hypertension Daughter   ? Hypertension Son   ? Diabetes Sister   ? Hypertension Sister   ? Diabetes Sister   ? Hypertension Sister   ? ? ?Outpatient Encounter Medications as of 01/08/2022  ?Medication Sig  ? [DISCONTINUED] Blood Glucose Monitoring Suppl (ACCU-CHEK GUIDE ME) w/Device KIT 1 Piece by Does not apply route as directed.  ? [DISCONTINUED] glucose blood (ACCU-CHEK GUIDE) test strip Use as instructed  ? acetaminophen (TYLENOL) 500 MG tablet Take 500-1,000 mg by mouth every 6 (six) hours as needed for moderate pain.   ? Albuterol Sulfate, sensor, (PROAIR DIGIHALER) 108 (90 Base) MCG/ACT AEPB Inhale into the lungs as needed.  ? amLODipine (NORVASC)  10 MG tablet Take 10 mg by mouth daily with breakfast.   ? aspirin 81 MG tablet Take 81 mg by mouth daily.    ? Blood Glucose Monitoring Suppl (ACCU-CHEK GUIDE ME) w/Device KIT 1 Piece by Does not apply route as directed.  ? glucose blood (ACCU-CHEK GUIDE) test strip Test glucose 2 times daily.  ? hydrochlorothiazide (HYDRODIURIL) 25 MG tablet Take 25 mg by mouth daily.  ? loratadine (CLARITIN) 10 MG tablet Take 10 mg by mouth daily.    ? magnesium oxide (MAG-OX) 400 MG tablet Take 400 mg by mouth daily.   ? metFORMIN (GLUCOPHAGE) 1000 MG tablet Take 1,000 mg by mouth 2 (two) times daily.  ? omeprazole (PRILOSEC) 20 MG capsule Take 20 mg by mouth daily with breakfast.   ? polyethylene glycol (MIRALAX / GLYCOLAX) packet Take 17 g by mouth daily as needed (for constipation).  ? simvastatin (ZOCOR) 20 MG tablet Take 20 mg by mouth daily.  ? spironolactone (ALDACTONE) 25 MG tablet Take 50 mg by mouth once.  ? Vitamin D, Ergocalciferol, (DRISDOL) 1.25 MG (50000 UNIT) CAPS capsule Take 50,000 Units by mouth every 30 (thirty) days.  ? [DISCONTINUED] glucose blood (ACCU-CHEK AVIVA PLUS) test strip Test glucose 2 times a day  ? ?No facility-administered encounter medications on file as of 01/08/2022.  ? ? ?ALLERGIES: ?No Known Allergies ? ?VACCINATION STATUS: ?Immunization History  ?Administered Date(s) Administered  ? Influenza-Unspecified 06/29/2014, 07/03/2018  ? ? ?Diabetes ?She presents for her follow-up diabetic visit. She has type 2 diabetes mellitus. Her disease course has been improving. There are no hypoglycemic associated symptoms. Pertinent negatives for hypoglycemia include no confusion, headaches, pallor or seizures. Pertinent negatives for diabetes include no chest pain, no fatigue, no polydipsia, no polyphagia and no polyuria. There are no hypoglycemic complications. Symptoms are improving. Risk factors for coronary artery disease include dyslipidemia, diabetes mellitus, family history, obesity, hypertension,  sedentary lifestyle and post-menopausal. Current diabetic treatments: She is currently on Metformin 1000 mg p.o. twice daily, Tradjenta 5 mg p.o. daily. Her weight is fluctuating minimally. She is following a generally unhealthy diet. When asked about meal planning, she reported none. She has not had a previous visit with a dietitian. She rarely participates in exercise. Her home blood glucose trend is decreasing steadily. Her breakfast blood glucose range is generally 130-140 mg/dl. Her overall blood glucose range  is 140-180 mg/dl. Reynolds presents with continued improvement in her glycemic profile.  Her point-of-care A1c is 6.8%, progressively improving from 9.3%.  He did not document any hypoglycemia.  Her average blood glucose is 150-170 mg per DL in the most recent  14 days.   ?) An ACE inhibitor/angiotensin II receptor blocker is being taken. She does not see a podiatrist.Eye exam is current.  ?Hyperlipidemia ?This is a chronic problem. The current episode started more than 1 year ago. The problem is uncontrolled. Exacerbating diseases include diabetes and obesity. Pertinent negatives include no chest pain, myalgias or shortness of breath. Current antihyperlipidemic treatment includes statins. Risk factors for coronary artery disease include diabetes mellitus, dyslipidemia, hypertension, a sedentary lifestyle, post-menopausal, obesity and family history.  ?Hypertension ?This is a chronic problem. The current episode started more than 1 year ago. The problem is controlled. Pertinent negatives include no chest pain, headaches, palpitations or shortness of breath. Risk factors for coronary artery disease include dyslipidemia, diabetes mellitus, obesity, sedentary lifestyle, family history and post-menopausal state. Past treatments include angiotensin blockers and calcium channel blockers.  ? ? ?Review of Systems  ?Constitutional:  Negative for chills, fatigue, fever and unexpected weight change.  ?HENT:   Negative for trouble swallowing and voice change.   ?Eyes:  Negative for visual disturbance.  ?Respiratory:  Negative for cough, shortness of breath and wheezing.   ?Cardiovascular:  Negative for chest pain, pa

## 2022-01-08 NOTE — Patient Instructions (Signed)

## 2022-01-08 NOTE — Addendum Note (Signed)
Addended by: Ellin Saba on: 01/08/2022 11:49 AM ? ? Modules accepted: Orders ? ?

## 2022-02-01 DIAGNOSIS — I10 Essential (primary) hypertension: Secondary | ICD-10-CM | POA: Diagnosis not present

## 2022-02-01 DIAGNOSIS — E1165 Type 2 diabetes mellitus with hyperglycemia: Secondary | ICD-10-CM | POA: Diagnosis not present

## 2022-03-03 DIAGNOSIS — E1165 Type 2 diabetes mellitus with hyperglycemia: Secondary | ICD-10-CM | POA: Diagnosis not present

## 2022-03-03 DIAGNOSIS — I10 Essential (primary) hypertension: Secondary | ICD-10-CM | POA: Diagnosis not present

## 2022-04-03 DIAGNOSIS — E1165 Type 2 diabetes mellitus with hyperglycemia: Secondary | ICD-10-CM | POA: Diagnosis not present

## 2022-04-03 DIAGNOSIS — E785 Hyperlipidemia, unspecified: Secondary | ICD-10-CM | POA: Diagnosis not present

## 2022-04-03 DIAGNOSIS — I10 Essential (primary) hypertension: Secondary | ICD-10-CM | POA: Diagnosis not present

## 2022-05-04 DIAGNOSIS — I10 Essential (primary) hypertension: Secondary | ICD-10-CM | POA: Diagnosis not present

## 2022-05-04 DIAGNOSIS — E1165 Type 2 diabetes mellitus with hyperglycemia: Secondary | ICD-10-CM | POA: Diagnosis not present

## 2022-05-04 DIAGNOSIS — E785 Hyperlipidemia, unspecified: Secondary | ICD-10-CM | POA: Diagnosis not present

## 2022-05-04 DIAGNOSIS — J4530 Mild persistent asthma, uncomplicated: Secondary | ICD-10-CM | POA: Diagnosis not present

## 2022-05-04 DIAGNOSIS — K219 Gastro-esophageal reflux disease without esophagitis: Secondary | ICD-10-CM | POA: Diagnosis not present

## 2022-05-08 DIAGNOSIS — E1165 Type 2 diabetes mellitus with hyperglycemia: Secondary | ICD-10-CM | POA: Diagnosis not present

## 2022-05-08 DIAGNOSIS — E782 Mixed hyperlipidemia: Secondary | ICD-10-CM | POA: Diagnosis not present

## 2022-05-09 LAB — COMPREHENSIVE METABOLIC PANEL
ALT: 20 IU/L (ref 0–32)
AST: 17 IU/L (ref 0–40)
Albumin/Globulin Ratio: 1.6 (ref 1.2–2.2)
Albumin: 4.7 g/dL (ref 3.9–4.9)
Alkaline Phosphatase: 79 IU/L (ref 44–121)
BUN/Creatinine Ratio: 13 (ref 12–28)
BUN: 15 mg/dL (ref 8–27)
Bilirubin Total: 0.4 mg/dL (ref 0.0–1.2)
CO2: 25 mmol/L (ref 20–29)
Calcium: 10 mg/dL (ref 8.7–10.3)
Chloride: 99 mmol/L (ref 96–106)
Creatinine, Ser: 1.14 mg/dL — ABNORMAL HIGH (ref 0.57–1.00)
Globulin, Total: 3 g/dL (ref 1.5–4.5)
Glucose: 136 mg/dL — ABNORMAL HIGH (ref 70–99)
Potassium: 5 mmol/L (ref 3.5–5.2)
Sodium: 141 mmol/L (ref 134–144)
Total Protein: 7.7 g/dL (ref 6.0–8.5)
eGFR: 52 mL/min/{1.73_m2} — ABNORMAL LOW (ref >59)

## 2022-05-09 LAB — LIPID PANEL
Chol/HDL Ratio: 2.6 ratio (ref 0.0–4.4)
Cholesterol, Total: 141 mg/dL (ref 100–199)
HDL: 54 mg/dL (ref >39)
LDL Chol Calc (NIH): 67 mg/dL (ref 0–99)
Triglycerides: 108 mg/dL (ref 0–149)
VLDL Cholesterol Cal: 20 mg/dL (ref 5–40)

## 2022-05-13 ENCOUNTER — Encounter: Payer: Self-pay | Admitting: "Endocrinology

## 2022-05-13 ENCOUNTER — Ambulatory Visit (INDEPENDENT_AMBULATORY_CARE_PROVIDER_SITE_OTHER): Payer: Medicare Other | Admitting: "Endocrinology

## 2022-05-13 VITALS — BP 130/68 | HR 76 | Ht 64.0 in | Wt 195.6 lb

## 2022-05-13 DIAGNOSIS — E1165 Type 2 diabetes mellitus with hyperglycemia: Secondary | ICD-10-CM

## 2022-05-13 DIAGNOSIS — E782 Mixed hyperlipidemia: Secondary | ICD-10-CM | POA: Diagnosis not present

## 2022-05-13 DIAGNOSIS — E559 Vitamin D deficiency, unspecified: Secondary | ICD-10-CM

## 2022-05-13 DIAGNOSIS — I10 Essential (primary) hypertension: Secondary | ICD-10-CM | POA: Diagnosis not present

## 2022-05-13 LAB — POCT GLYCOSYLATED HEMOGLOBIN (HGB A1C): HbA1c, POC (controlled diabetic range): 6.9 % (ref 0.0–7.0)

## 2022-05-13 MED ORDER — METFORMIN HCL 500 MG PO TABS: 500.0000 mg | ORAL_TABLET | Freq: Two times a day (BID) | ORAL | 1 refills | Status: DC

## 2022-05-13 NOTE — Progress Notes (Signed)
05/13/2022, 2:58 PM   Endocrinology follow-up note  Subjective:    Patient ID: Mary Robinson, female    DOB: 1956/02/12.  Mary Robinson is being seen in follow-up after she was seen in consultation for management of currently uncontrolled symptomatic diabetes requested by  Carrolyn Meiers, MD.   Past Medical History:  Diagnosis Date   Anemia    Anxiety    Arthritis    Asthma    Bursitis    Chronic back pain    Chronic constipation    Depression    Diastasis recti    Essential hypertension    GERD (gastroesophageal reflux disease)    Helicobacter pylori gastritis    Treated in May 2011   Hyperlipidemia    Obesity    S/P colonoscopy 12/2009   Multiple tubular adenomas: next TCS May 2014   S/P endoscopy 12/2009   Severe erosive reflux esophagitis, noncritial schatzki's ring, +h.pylori   Seasonal allergies    Sleep apnea    Type II diabetes mellitus ([place])    Umbilical hernia     Past Surgical History:  Procedure Laterality Date   CHOLECYSTECTOMY  1999   COLONOSCOPY  May 2011   Dr. Gala Romney: long, torturous colon, multiple colonic polyps. TUBULAR ADENOMAS   COLONOSCOPY N/A 04/15/2013   normal rectum, torturous colon.  tubular adenomas. Surveillance due 2019.    DILATION AND CURETTAGE OF UTERUS  1970's   ESOPHAGOGASTRODUODENOSCOPY  May 2011   Dr. Gala Romney: severe erosive reflux esophagitis, non-critical Schatzki's ring, diffuse gastric erosions. H. PYLORI GASTRITIS   HERNIA REPAIR  05/04/2013   laparoscopic incisional hernia repair w/mesh per notes(05/04/2013)   INCISIONAL HERNIA REPAIR N/A 05/04/2013   Procedure: LAPAROSCOPIC INCISIONAL HERNIA ;  Surgeon: Adin Hector, MD;  Location: [facility];  Service: General;  Laterality: N/A;   INSERTION OF MESH N/A 05/04/2013   Procedure: INSERTION OF MESH;  Surgeon: Adin Hector, MD;  Location: [facility];  Service: General;  Laterality: N/A;    TUBAL LIGATION  1970's    Social History   Socioeconomic History   Marital status: Single    Spouse name: Not on file   Number of children: Not on file   Years of education: Not on file   Highest education level: Not on file  Occupational History   Occupation: retired  Tobacco Use   Smoking status: Never   Smokeless tobacco: Never  Vaping Use   Vaping Use: Never used  Substance and Sexual Activity   Alcohol use: No   Drug use: No   Sexual activity: Never    Birth control/protection: Post-menopausal  Other Topics Concern   Not on file  Social History Narrative   Not on file   Social Determinants of Health   Financial Resource Strain: Not on file  Food Insecurity: Not on file  Transportation Needs: Not on file  Physical Activity: Not on file  Stress: Not on file  Social Connections: Not on file    Family History  Problem Relation Age of Onset   Colon cancer Maternal Uncle    Colon cancer Paternal Uncle  COPD Mother    Hypertension Mother    Seizures Mother    Chronic Renal Failure Mother    Hyperlipidemia Mother    Stroke Mother    Kidney disease Mother    Heart failure Mother    Heart disease Father    Diabetes Sister    Hypertension Sister    Heart disease Sister    Diabetes Brother    Hypertension Brother    Hypertension Daughter    Hypertension Son    Diabetes Sister    Hypertension Sister    Diabetes Sister    Hypertension Sister     Outpatient Encounter Medications as of 05/13/2022  Medication Sig   acetaminophen (TYLENOL) 500 MG tablet Take 500-1,000 mg by mouth every 6 (six) hours as needed for moderate pain.    Albuterol Sulfate, sensor, (PROAIR DIGIHALER) 108 (90 Base) MCG/ACT AEPB Inhale into the lungs as needed.   amLODipine (NORVASC) 10 MG tablet Take 10 mg by mouth daily with breakfast.    aspirin 81 MG tablet Take 81 mg by mouth daily.     Blood Glucose Monitoring Suppl (ACCU-CHEK GUIDE ME) w/Device KIT 1 Piece by Does not apply  route as directed.   glucose blood (ACCU-CHEK GUIDE) test strip Test glucose 2 times daily.   hydrochlorothiazide (HYDRODIURIL) 25 MG tablet Take 25 mg by mouth daily.   loratadine (CLARITIN) 10 MG tablet Take 10 mg by mouth daily.     magnesium oxide (MAG-OX) 400 MG tablet Take 400 mg by mouth daily.    metFORMIN (GLUCOPHAGE) 500 MG tablet Take 1 tablet (500 mg total) by mouth 2 (two) times daily.   omeprazole (PRILOSEC) 20 MG capsule Take 20 mg by mouth daily with breakfast.    polyethylene glycol (MIRALAX / GLYCOLAX) packet Take 17 g by mouth daily as needed (for constipation).   simvastatin (ZOCOR) 20 MG tablet Take 20 mg by mouth daily.   spironolactone (ALDACTONE) 25 MG tablet Take 50 mg by mouth once.   Vitamin D, Ergocalciferol, (DRISDOL) 1.25 MG (50000 UNIT) CAPS capsule Take 50,000 Units by mouth every 30 (thirty) days.   [DISCONTINUED] metFORMIN (GLUCOPHAGE) 1000 MG tablet Take 1,000 mg by mouth 2 (two) times daily.   No facility-administered encounter medications on file as of 05/13/2022.    ALLERGIES: No Known Allergies  VACCINATION STATUS: Immunization History  Administered Date(s) Administered   Influenza-Unspecified 06/29/2014, 07/03/2018    Diabetes She presents for her follow-up diabetic visit. She has type 2 diabetes mellitus. Her disease course has been fluctuating. There are no hypoglycemic associated symptoms. Pertinent negatives for hypoglycemia include no confusion, headaches, pallor or seizures. Pertinent negatives for diabetes include no chest pain, no fatigue, no polydipsia, no polyphagia and no polyuria. There are no hypoglycemic complications. Symptoms are improving. Risk factors for coronary artery disease include dyslipidemia, diabetes mellitus, family history, obesity, hypertension, sedentary lifestyle and post-menopausal. Current diabetic treatments: She is currently on Metformin 1000 mg p.o. twice daily, Tradjenta 5 mg p.o. daily. Her weight is decreasing  steadily. She is following a generally unhealthy diet. When asked about meal planning, she reported none. She has not had a previous visit with a dietitian. She rarely participates in exercise. Her home blood glucose trend is decreasing steadily. Her breakfast blood glucose range is generally 130-140 mg/dl. Her overall blood glucose range is 140-180 mg/dl. [employer] presents with continued improvement in her glycemic profile.  Her point-of-care A1c is 6.8%, progressively improving from 9.3%.  He did not document any hypoglycemia.  Her average blood glucose is 150-170 mg per DL in the most recent  14 days.   ) An ACE inhibitor/angiotensin II receptor blocker is being taken. She does not see a podiatrist.Eye exam is current.  Hyperlipidemia This is a chronic problem. The current episode started more than 1 year ago. The problem is uncontrolled. Exacerbating diseases include diabetes and obesity. Pertinent negatives include no chest pain, myalgias or shortness of breath. Current antihyperlipidemic treatment includes statins. Risk factors for coronary artery disease include diabetes mellitus, dyslipidemia, hypertension, a sedentary lifestyle, post-menopausal, obesity and family history.  Hypertension This is a chronic problem. The current episode started more than 1 year ago. The problem is controlled. Pertinent negatives include no chest pain, headaches, palpitations or shortness of breath. Risk factors for coronary artery disease include dyslipidemia, diabetes mellitus, obesity, sedentary lifestyle, family history and post-menopausal state. Past treatments include angiotensin blockers and calcium channel blockers.     Review of Systems  Constitutional:  Negative for chills, fatigue, fever and unexpected weight change.  HENT:  Negative for trouble swallowing and voice change.   Eyes:  Negative for visual disturbance.  Respiratory:  Negative for cough, shortness of breath and wheezing.   Cardiovascular:   Negative for chest pain, palpitations and leg swelling.  Gastrointestinal:  Negative for diarrhea, nausea and vomiting.  Endocrine: Negative for cold intolerance, heat intolerance, polydipsia, polyphagia and polyuria.  Musculoskeletal:  Negative for arthralgias and myalgias.  Skin:  Negative for color change, pallor, rash and wound.  Neurological:  Negative for seizures and headaches.  Psychiatric/Behavioral:  Negative for confusion and suicidal ideas.     Objective:       05/13/2022    9:49 AM 01/08/2022    9:49 AM 09/05/2021    9:21 AM  Vitals with BMI  Height _0  _1  _2   Weight 195 lbs 10 oz 199 lbs 13 oz 202 lbs  BMI 33.56 54.62 70.35  Systolic 009 381   Diastolic 68 78   Pulse 76 80     BP 130/68   Pulse 76   Ht _3  (1.626 m)   Wt 195 lb 9.6 oz (88.7 kg)   BMI 33.57 kg/m   Wt Readings from Last 3 Encounters:  05/13/22 195 lb 9.6 oz (88.7 kg)  01/08/22 199 lb 12.8 oz (90.6 kg)  09/05/21 202 lb (91.6 kg)      CMP ( most recent) CMP     Component Value Date/Time   NA 141 05/08/2022 0904   K 5.0 05/08/2022 0904   CL 99 05/08/2022 0904   CO2 25 05/08/2022 0904   GLUCOSE 136 (H) 05/08/2022 0904   GLUCOSE 152 (H) 11/09/2019 1020   BUN 15 05/08/2022 0904   CREATININE 1.14 (H) 05/08/2022 0904   CREATININE 1.05 (H) 11/09/2019 1020   CALCIUM 10.0 05/08/2022 0904   PROT 7.7 05/08/2022 0904   ALBUMIN 4.7 05/08/2022 0904   AST 17 05/08/2022 0904   ALT 20 05/08/2022 0904   ALKPHOS 79 05/08/2022 0904   BILITOT 0.4 05/08/2022 0904   GFRNONAA 59 10/10/2020 0000   GFRNONAA 55 (L) 11/09/2019 1020   GFRAA 69 10/10/2020 0000   GFRAA 64 11/09/2019 1020     Diabetic Labs (most recent): Lab Results  Component Value Date   HGBA1C 6.9 05/13/2022   HGBA1C 6.8 01/08/2022   HGBA1C 7.1 (A) 09/05/2021     Lipid Panel ( most recent) Lipid Panel     Component Value Date/Time  CHOL 141 05/08/2022 0904   TRIG 108 05/08/2022 0904   HDL 54 05/08/2022 0904   CHOLHDL  2.6 05/08/2022 0904   LDLCALC 67 05/08/2022 0904   LABVLDL 20 05/08/2022 0904      Assessment & Plan:   1. 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  Tylee presents with continued improvement in her glycemic profile.  Her point-of-care A1c is 6.9%, overall improving from 9.3%.  She did not document hypoglycemia lately.    -Recent labs reviewed. - I had a long discussion with her about the progressive nature of diabetes and the pathology behind its complications. -her diabetes is complicated by obesity/sedentary life and she remains at a high risk for more acute and chronic complications which include CAD, CVA, CKD, retinopathy, and neuropathy. These are all discussed in detail with her.  - I have counseled her on diet  and weight management  by adopting a carbohydrate restricted/protein rich diet. Patient is encouraged to switch to  unprocessed or minimally processed     complex starch and increased protein intake (animal or plant source), fruits, and vegetables. -  she is advised to stick to a routine mealtimes to eat 3 meals  a day and avoid unnecessary snacks ( to snack only to correct hypoglycemia).   - she acknowledges that there is a room for improvement in her food and drink choices. - Suggestion is made for her to avoid simple carbohydrates  from her diet including Cakes, Sweet Desserts, Ice Cream, Soda (diet and regular), Sweet Tea, Candies, Chips, Cookies, Store Bought Juices, Alcohol , Artificial Sweeteners,  Coffee Creamer, and "Sugar-free" Products, Lemonade. This will help patient to have more stable blood glucose profile and potentially avoid unintended weight gain.  The following Lifestyle Medicine recommendations according to [facility]) were discussed and and offered to patient and she  agrees to start the journey:  A. Whole Foods,  Plant-Based Nutrition comprising of fruits and vegetables, plant-based proteins, whole-grain carbohydrates was discussed in detail with the patient.   A list for source of those nutrients were also provided to the patient.  Patient will use only water or unsweetened tea for hydration. B.  The need to stay away from risky substances including alcohol, smoking; obtaining 7 to 9 hours of restorative sleep, at least 150 minutes of moderate intensity exercise weekly, the importance of healthy social connections,  and stress management techniques were discussed. C.  A full color page of  Calorie density of various food groups per pound showing examples of each food groups was provided to the patient.    - she has been scheduled with Jearld Fenton, RDN, CDE for diabetes education.  - I have approached her with the following individualized plan to manage  her diabetes and patient agrees:   -Based on her response to treatment, she will not need insulin treatment for now.   She did not tolerate Rybelsus.   -In light of her presentation with mild CKD, she is advised to lower metformin to 500 mg p.o. twice daily-   daily after breakfast and after supper.  She is advised to continue  to monitor blood glucose at least once a day-daily before breakfast.    - she is encouraged to call clinic for blood glucose levels less than 70 or above 200 mg per DL at fasting .   - Specific  targets for  A1c;  LDL, HDL,  and Triglycerides were discussed with the patient.  2) Blood Pressure /Hypertension: -Her blood pressure is controlled to target.  she is advised to continue her current medications including amlodipine 10 mg p.o. daily, losartan 100 mg p.o. daily with breakfast.     3) Lipids/Hyperlipidemia:   Review of her recent lipid panel showed  controlled  LDL at 63 .  she  is advised to continue simvastatin 20 mg p.o. daily at bedtime.     Side effects and precautions discussed with her.     4)  Weight/Diet:   Body mass index is 33.57 kg/m.  -   clearly complicating her diabetes care.  She lost 7 pounds since last visit,  she is  a candidate for more weight loss. I discussed with her the fact that loss of 5 - 10% of her  current body weight will have the most impact on her diabetes management.  Exercise, and detailed carbohydrates information provided  -  detailed on discharge instructions.  5) Chronic Care/Health Maintenance:  -she   on ACEI/ARB and Statin medications and  is encouraged to initiate and continue to follow up with Ophthalmology, Dentist,  Podiatrist at least yearly or according to recommendations, and advised to   stay away from smoking. I have recommended yearly flu vaccine and pneumonia vaccine at least every 5 years; moderate intensity exercise for up to 150 minutes weekly; and  sleep for at least 7 hours a day.  - she is  advised to maintain close follow up with Carrolyn Meiers, MD for primary care needs, as well as her other providers for optimal and coordinated care.   I spent 41 minutes in the care of the patient today including review of labs from [facility], Lipids, Thyroid Function, Hematology (current and previous including abstractions from other facilities); face-to-face time discussing  her blood glucose readings/logs, discussing hypoglycemia and hyperglycemia episodes and symptoms, medications doses, her options of short and long term treatment based on the latest standards of care / guidelines;  discussion about incorporating lifestyle medicine;  and documenting the encounter. Risk reduction counseling performed per USPSTF guidelines to reduce  obesity and cardiovascular risk factors.     Please refer to Patient Instructions for Blood Glucose Monitoring and Insulin/Medications Dosing Guide"  in media tab for additional information. Please  also refer to " Patient Self Inventory" in the Media  tab for reviewed elements of pertinent patient history.  Everrett Coombe  participated in the discussions, expressed understanding, and voiced agreement with the above plans.  All questions were answered to her satisfaction. she is encouraged to contact clinic should she have any questions or concerns prior to her return visit.   Follow up plan: - Return in about 6 months (around 11/11/2022) for F/U with Pre-visit Labs, Meter/CGM/Logs, A1c here.  Glade Lloyd, MD [facility] [address] [place], [place] [address] Phone: [phone]  Fax: [phone]    05/13/2022, 2:58 PM  This note was partially dictated with voice recognition software. Similar sounding words can be transcribed inadequately or may not  be corrected upon review.

## 2022-05-13 NOTE — Patient Instructions (Signed)
                                     Advice for Weight Management  -For most of us the best way to lose weight is by diet management. Generally speaking, diet management means consuming less calories intentionally which over time brings about progressive weight loss.  This can be achieved more effectively by avoiding ultra processed carbohydrates, processed meats, unhealthy fats.    It is critically important to know your numbers: how much calorie you are consuming and how much calorie you need. More importantly, our carbohydrates sources should be unprocessed naturally occurring  complex starch food items.  It is always important to balance nutrition also by  appropriate intake of proteins (mainly plant-based), healthy fats/oils, plenty of fruits and vegetables.   -The American College of Lifestyle Medicine (ACL M) recommends nutrition derived mostly from Whole Food, Plant Predominant Sources example an apple instead of applesauce or apple pie. Eat Plenty of vegetables, Mushrooms, fruits, Legumes, Whole Grains, Nuts, seeds in lieu of processed meats, processed snacks/pastries red meat, poultry, eggs.  Use only water or unsweetened tea for hydration.  The College also recommends the need to stay away from risky substances including alcohol, smoking; obtaining 7-9 hours of restorative sleep, at least 150 minutes of moderate intensity exercise weekly, importance of healthy social connections, and being mindful of stress and seek help when it is overwhelming.    -Sticking to a routine mealtime to eat 3 meals a day and avoiding unnecessary snacks is shown to have a big role in weight control. Under normal circumstances, the only time we burn stored energy is when we are hungry, so allow  some hunger to take place- hunger means no food between appropriate meal times, only water.  It is not advisable to starve.   -It is better to avoid simple carbohydrates including:  Cakes, Sweet Desserts, Ice Cream, Soda (diet and regular), Sweet Tea, Candies, Chips, Cookies, Store Bought Juices, Alcohol in Excess of  1-2 drinks a day, Lemonade,  Artificial Sweeteners, Doughnuts, Coffee Creamers, "Sugar-free" Products, etc, etc.  This is not a complete list.....    -Consulting with certified diabetes educators is proven to provide you with the most accurate and current information on diet.  Also, you may be  interested in discussing diet options/exchanges , we can schedule a visit with Mary Robinson, RDN, CDE for individualized nutrition education.  -Exercise: If you are able: 30 -60 minutes a day ,4 days a week, or 150 minutes of moderate intensity exercise weekly.    The longer the better if tolerated.  Combine stretch, strength, and aerobic activities.  If you were told in the past that you have high risk for cardiovascular diseases, or if you are currently symptomatic, you may seek evaluation by your heart doctor prior to initiating moderate to intense exercise programs.                                  Additional Care Considerations for Diabetes/Prediabetes   -Diabetes  is a chronic disease.  The most important care consideration is regular follow-up with your diabetes care provider with the goal being avoiding or delaying its complications and to take advantage of advances in medications and technology.  If appropriate actions are taken early enough, type 2 diabetes can even be   reversed.  Seek information from the right source.  - Whole Food, Plant Predominant Nutrition is highly recommended: Eat Plenty of vegetables, Mushrooms, fruits, Legumes, Whole Grains, Nuts, seeds in lieu of processed meats, processed snacks/pastries red meat, poultry, eggs as recommended by American College of  Lifestyle Medicine (ACLM).  -Type 2 diabetes is known to coexist with other important comorbidities such as high blood pressure and high cholesterol.  It is critical to control not only the  diabetes but also the high blood pressure and high cholesterol to minimize and delay the risk of complications including coronary artery disease, stroke, amputations, blindness, etc.  The good news is that this diet recommendation for type 2 diabetes is also very helpful for managing high cholesterol and high blood blood pressure.  - Studies showed that people with diabetes will benefit from a class of medications known as ACE inhibitors and statins.  Unless there are specific reasons not to be on these medications, the standard of care is to consider getting one from these groups of medications at an optimal doses.  These medications are generally considered safe and proven to help protect the heart and the kidneys.    - People with diabetes are encouraged to initiate and maintain regular follow-up with eye doctors, foot doctors, dentists , and if necessary heart and kidney doctors.     - It is highly recommended that people with diabetes quit smoking or stay away from smoking, and get yearly  flu vaccine and pneumonia vaccine at least every 5 years.  See above for additional recommendations on exercise, sleep, stress management , and healthy social connections.      

## 2022-06-03 DIAGNOSIS — I10 Essential (primary) hypertension: Secondary | ICD-10-CM | POA: Diagnosis not present

## 2022-06-03 DIAGNOSIS — E1165 Type 2 diabetes mellitus with hyperglycemia: Secondary | ICD-10-CM | POA: Diagnosis not present

## 2022-06-07 ENCOUNTER — Ambulatory Visit (HOSPITAL_COMMUNITY): Payer: Medicare Other

## 2022-06-25 DIAGNOSIS — H524 Presbyopia: Secondary | ICD-10-CM | POA: Diagnosis not present

## 2022-06-25 DIAGNOSIS — H35033 Hypertensive retinopathy, bilateral: Secondary | ICD-10-CM | POA: Diagnosis not present

## 2022-07-04 DIAGNOSIS — I10 Essential (primary) hypertension: Secondary | ICD-10-CM | POA: Diagnosis not present

## 2022-07-04 DIAGNOSIS — E1165 Type 2 diabetes mellitus with hyperglycemia: Secondary | ICD-10-CM | POA: Diagnosis not present

## 2022-07-05 ENCOUNTER — Ambulatory Visit (HOSPITAL_COMMUNITY)
Admission: RE | Admit: 2022-07-05 | Discharge: 2022-07-05 | Disposition: A | Discharge status: Home / Self Care | Payer: Medicare Other | Source: Ambulatory Visit | Attending: Internal Medicine | Admitting: Internal Medicine

## 2022-07-05 DIAGNOSIS — Z1231 Encounter for screening mammogram for malignant neoplasm of breast: Secondary | ICD-10-CM | POA: Insufficient documentation

## 2022-08-03 DIAGNOSIS — I10 Essential (primary) hypertension: Secondary | ICD-10-CM | POA: Diagnosis not present

## 2022-08-03 DIAGNOSIS — E1165 Type 2 diabetes mellitus with hyperglycemia: Secondary | ICD-10-CM | POA: Diagnosis not present

## 2022-08-11 ENCOUNTER — Emergency Department (HOSPITAL_COMMUNITY)
Admission: EM | Admit: 2022-08-11 | Discharge: 2022-08-11 | Disposition: A | Discharge status: Home / Self Care | Payer: Medicare Other | Attending: Emergency Medicine | Admitting: Emergency Medicine

## 2022-08-11 ENCOUNTER — Other Ambulatory Visit: Payer: Self-pay

## 2022-08-11 ENCOUNTER — Encounter (HOSPITAL_COMMUNITY): Payer: Self-pay

## 2022-08-11 ENCOUNTER — Emergency Department (HOSPITAL_COMMUNITY): Payer: Medicare Other

## 2022-08-11 DIAGNOSIS — E1165 Type 2 diabetes mellitus with hyperglycemia: Secondary | ICD-10-CM | POA: Insufficient documentation

## 2022-08-11 DIAGNOSIS — Z7982 Long term (current) use of aspirin: Secondary | ICD-10-CM | POA: Diagnosis not present

## 2022-08-11 DIAGNOSIS — U071 COVID-19: Secondary | ICD-10-CM | POA: Diagnosis not present

## 2022-08-11 DIAGNOSIS — Z7984 Long term (current) use of oral hypoglycemic drugs: Secondary | ICD-10-CM | POA: Insufficient documentation

## 2022-08-11 DIAGNOSIS — R9431 Abnormal electrocardiogram [ECG] [EKG]: Secondary | ICD-10-CM | POA: Diagnosis not present

## 2022-08-11 DIAGNOSIS — R059 Cough, unspecified: Secondary | ICD-10-CM | POA: Diagnosis not present

## 2022-08-11 DIAGNOSIS — R0600 Dyspnea, unspecified: Secondary | ICD-10-CM | POA: Diagnosis not present

## 2022-08-11 LAB — RESP PANEL BY RT-PCR (RSV, FLU A&B, COVID)  RVPGX2
Influenza A by PCR: NEGATIVE
Influenza B by PCR: NEGATIVE
Resp Syncytial Virus by PCR: NEGATIVE
SARS Coronavirus 2 by RT PCR: POSITIVE — AB

## 2022-08-11 LAB — CBG MONITORING, ED: Glucose-Capillary: 161 mg/dL — ABNORMAL HIGH (ref 70–99)

## 2022-08-11 MED ORDER — GUAIFENESIN 100 MG/5ML PO LIQD: 100.0000 mg | ORAL | 0 refills | Status: DC | PRN

## 2022-08-11 MED ORDER — MOLNUPIRAVIR 200 MG PO CAPS: 4.0000 | ORAL_CAPSULE | Freq: Two times a day (BID) | ORAL | 0 refills | Status: AC

## 2022-08-11 MED ORDER — BENZONATATE 100 MG PO CAPS: 100.0000 mg | ORAL_CAPSULE | Freq: Three times a day (TID) | ORAL | 0 refills | Status: DC

## 2022-08-11 NOTE — ED Provider Notes (Signed)
[facility] EMERGENCY DEPARTMENT Provider Note   CSN: [id number] Arrival date & time: 08/11/22  9735     History  Chief Complaint  Patient presents with   Flu-like symptoms    Mary Robinson is a 66 y.o. female.  HPI 66 year old female with a history of diabetes presents with flulike symptoms.  Symptoms started 2 days ago.  She has had cough, sore throat, rhinorrhea, body aches.  Today when walking into the emergency department she felt a little short of breath.  Her fasting glucose was 165 this morning which is elevated for her.  She has a lack of appetite.  No chest pain, vomiting, etc.  She took Tylenol when this for started and then has been taking NyQuil but feels like she needs something stronger for cough.  Home Medications Prior to Admission medications   Medication Sig Start Date End Date Taking? Authorizing Provider  benzonatate (TESSALON) 100 MG capsule Take 1 capsule (100 mg total) by mouth every 8 (eight) hours. 08/11/22  Yes Sherwood Gambler, MD  guaiFENesin (ROBITUSSIN) 100 MG/5ML liquid Take 5-10 mLs (100-200 mg total) by mouth every 4 (four) hours as needed for cough or to loosen phlegm. 08/11/22  Yes Sherwood Gambler, MD  molnupiravir EUA (LAGEVRIO) 200 MG CAPS capsule Take 4 capsules (800 mg total) by mouth 2 (two) times daily for 5 days. 08/11/22 08/16/22 Yes Sherwood Gambler, MD  acetaminophen (TYLENOL) 500 MG tablet Take 500-1,000 mg by mouth every 6 (six) hours as needed for moderate pain.     [provider]  Albuterol Sulfate, sensor, (PROAIR DIGIHALER) 108 (90 Base) MCG/ACT AEPB Inhale into the lungs as needed.    [provider]  amLODipine (NORVASC) 10 MG tablet Take 10 mg by mouth daily with breakfast.     [provider]  aspirin 81 MG tablet Take 81 mg by mouth daily.      [provider]  Blood Glucose Monitoring Suppl (ACCU-CHEK GUIDE ME) w/Device KIT 1 Piece by Does not apply route as directed. 01/08/22   Cassandria Anger, MD  glucose blood (ACCU-CHEK GUIDE) test strip Test glucose 2 times daily. 01/08/22   Cassandria Anger, MD  hydrochlorothiazide (HYDRODIURIL) 25 MG tablet Take 25 mg by mouth daily. 09/28/19   [provider]  loratadine (CLARITIN) 10 MG tablet Take 10 mg by mouth daily.      [provider]  magnesium oxide (MAG-OX) 400 MG tablet Take 400 mg by mouth daily.  10/25/19   [provider]  metFORMIN (GLUCOPHAGE) 500 MG tablet Take 1 tablet (500 mg total) by mouth 2 (two) times daily. 05/13/22   Cassandria Anger, MD  omeprazole (PRILOSEC) 20 MG capsule Take 20 mg by mouth daily with breakfast.  03/23/13   [provider]  polyethylene glycol (MIRALAX / GLYCOLAX) packet Take 17 g by mouth daily as needed (for constipation). 09/28/14   Mahala Menghini, PA-C  simvastatin (ZOCOR) 20 MG tablet Take 20 mg by mouth daily. 09/01/19   [provider]  spironolactone (ALDACTONE) 25 MG tablet Take 50 mg by mouth once. 08/18/19   [provider]  Vitamin D, Ergocalciferol, (DRISDOL) 1.25 MG (50000 UNIT) CAPS capsule Take 50,000 Units by mouth every 30 (thirty) days.    [provider]      Allergies    Patient has no known allergies.    Review of Systems   Review of Systems  HENT:  Positive for rhinorrhea and sore throat.  Respiratory:  Positive for cough and shortness of breath.   Cardiovascular:  Negative for chest pain.  Gastrointestinal:  Negative for vomiting.    Physical Exam Updated Vital Signs BP (!) 158/68   Pulse (!) 103   Temp 99.2 F (37.3 C) (Oral)   Resp 18   Ht _0  (1.626 m)   Wt 90.7 kg   SpO2 97%   BMI 34.33 kg/m  Physical Exam Vitals and nursing note reviewed.  Constitutional:      General: She is not in acute distress.    Appearance: She is well-developed. She is not ill-appearing or diaphoretic.  HENT:     Head: Normocephalic and atraumatic.     Mouth/Throat:     Mouth: Mucous  membranes are moist.     Pharynx: Oropharynx is clear. No oropharyngeal exudate or posterior oropharyngeal erythema.  Cardiovascular:     Rate and Rhythm: Normal rate and regular rhythm.     Heart sounds: Normal heart sounds.  Pulmonary:     Effort: Pulmonary effort is normal.     Breath sounds: Normal breath sounds. No wheezing, rhonchi or rales.  Skin:    General: Skin is warm and dry.  Neurological:     Mental Status: She is alert.     ED Results / Procedures / Treatments   Labs (all labs ordered are listed, but only abnormal results are displayed) Labs Reviewed  RESP PANEL BY RT-PCR (RSV, FLU A&B, COVID)  RVPGX2 - Abnormal; Notable for the following components:      Result Value   SARS Coronavirus 2 by RT PCR POSITIVE (*)    All other components within normal limits  CBG MONITORING, ED - Abnormal; Notable for the following components:   Glucose-Capillary 161 (*)    All other components within normal limits    EKG None  Radiology DG Chest 2 View  Result Date: 08/11/2022 CLINICAL DATA:  Cough.  Dyspnea. EXAM: CHEST - 2 VIEW COMPARISON:  Chest XR, 04/30/2013. FINDINGS: Cardiomediastinal silhouette is within normal limits. Lungs are well inflated. Trace linear basilar thickening. No focal consolidation or mass. No pleural effusion or pneumothorax. No acute displaced fracture. IMPRESSION: No acute cardiopulmonary process. Electronically Signed   By: Michaelle Birks M.D.   On: 08/11/2022 09:11    Procedures Procedures    Medications Ordered in ED Medications - No data to display  ED Course/ Medical Decision Making/ A&P                           Medical Decision Making Amount and/or Complexity of Data Reviewed External Data Reviewed: notes. Labs:     Details: CBG 160.  COVID test positive. Radiology: ordered and independent interpretation performed.    Details: No lobar pneumonia  Risk OTC drugs. Prescription drug management.   Patient presents with flulike  symptoms and is COVID-positive.  Symptoms of only been going on for about 48 hours.  She is a diabetic and obese and so I think is reasonable to treat with antivirals.  She is on multiple meds that could interact with Paxlovid so we will give molnupiravir instead.  Will also give cough suppression.  She has some mild hyperglycemia at 160 but I highly doubt DKA or euglycemic DKA.  I do not think labs are warranted.  On my exam, her heart rate is now in the 80s and she is well-appearing.  She is not hypoxic.  She appears stable for outpatient  management with antivirals and supportive care.  Given return precautions.        Final Clinical Impression(s) / ED Diagnoses Final diagnoses:  COVID-19    Rx / DC Orders ED Discharge Orders          Ordered    molnupiravir EUA (LAGEVRIO) 200 MG CAPS capsule  2 times daily        08/11/22 0956    guaiFENesin (ROBITUSSIN) 100 MG/5ML liquid  Every 4 hours PRN        08/11/22 0956    benzonatate (TESSALON) 100 MG capsule  Every 8 hours        08/11/22 0956              Sherwood Gambler, MD 08/11/22 1035

## 2022-08-11 NOTE — Discharge Instructions (Addendum)
Your COVID test is positive today.  You are being treated with an antiviral (Molnupiravir) to help speed up your recovery.  However if at any point you develop vomiting, coughing up blood, new or worsening shortness of breath, or any other new/concerning symptoms then return to the ER for evaluation.

## 2022-08-11 NOTE — ED Triage Notes (Signed)
Patient reports sore throat, cough, SHOB, chills, fever, generalized body aches, runny nose that started this past Friday. States that she is diabetic with elevated blood sugars this am.

## 2022-08-29 ENCOUNTER — Emergency Department (HOSPITAL_COMMUNITY)
Admission: EM | Admit: 2022-08-29 | Discharge: 2022-08-29 | Disposition: A | Discharge status: Home / Self Care | Payer: Medicare Other | Attending: Emergency Medicine | Admitting: Emergency Medicine

## 2022-08-29 DIAGNOSIS — Z7984 Long term (current) use of oral hypoglycemic drugs: Secondary | ICD-10-CM | POA: Insufficient documentation

## 2022-08-29 DIAGNOSIS — Z7982 Long term (current) use of aspirin: Secondary | ICD-10-CM | POA: Insufficient documentation

## 2022-08-29 DIAGNOSIS — M79672 Pain in left foot: Secondary | ICD-10-CM | POA: Insufficient documentation

## 2022-08-29 NOTE — ED Triage Notes (Signed)
Pt presents to ED from home C/O L foot pain X 2 months. Denies known injury.

## 2022-08-29 NOTE — ED Provider Notes (Signed)
[name] Note   CSN: [id number] Arrival date & time: 08/29/22  1556     History  No chief complaint on file.   Mary Robinson is a 66 y.o. female here with left foot pain, ongoing for 3 months.  She feels like there is something sharp in the middle of her left heel when she steps on it.  It is better when she elevates her foot.  Denies any known trauma or stepping on anything she is aware of.  She has not seen a podiatrist  HPI     Home Medications Prior to Admission medications   Medication Sig Start Date End Date Taking? Authorizing Provider  acetaminophen (TYLENOL) 500 MG tablet Take 500-1,000 mg by mouth every 6 (six) hours as needed for moderate pain.     [provider]  Albuterol Sulfate, sensor, (PROAIR DIGIHALER) 108 (90 Base) MCG/ACT AEPB Inhale into the lungs as needed.    [provider]  amLODipine (NORVASC) 10 MG tablet Take 10 mg by mouth daily with breakfast.     [provider]  aspirin 81 MG tablet Take 81 mg by mouth daily.      [provider]  benzonatate (TESSALON) 100 MG capsule Take 1 capsule (100 mg total) by mouth every 8 (eight) hours. 08/11/22   Mary Gambler, MD  Blood Glucose Monitoring Suppl (ACCU-CHEK GUIDE ME) w/Device KIT 1 Piece by Does not apply route as directed. 01/08/22   Mary Anger, MD  glucose blood (ACCU-CHEK GUIDE) test strip Test glucose 2 times daily. 01/08/22   Mary Anger, MD  guaiFENesin (ROBITUSSIN) 100 MG/5ML liquid Take 5-10 mLs (100-200 mg total) by mouth every 4 (four) hours as needed for cough or to loosen phlegm. 08/11/22   Mary Gambler, MD  hydrochlorothiazide (HYDRODIURIL) 25 MG tablet Take 25 mg by mouth daily. 09/28/19   [provider]  loratadine (CLARITIN) 10 MG tablet Take 10 mg by mouth daily.      [provider]  magnesium oxide (MAG-OX) 400 MG tablet Take 400 mg by mouth daily.  10/25/19   [provider]  metFORMIN (GLUCOPHAGE) 500 MG tablet Take 1 tablet (500 mg total) by mouth 2 (two) times daily. 05/13/22   Mary Anger, MD  omeprazole (PRILOSEC) 20 MG capsule Take 20 mg by mouth daily with breakfast.  03/23/13   [provider]  polyethylene glycol (MIRALAX / GLYCOLAX) packet Take 17 g by mouth daily as needed (for constipation). 09/28/14   Mary Menghini, PA-C  simvastatin (ZOCOR) 20 MG tablet Take 20 mg by mouth daily. 09/01/19   [provider]  spironolactone (ALDACTONE) 25 MG tablet Take 50 mg by mouth once. 08/18/19   [provider]  Vitamin D, Ergocalciferol, (DRISDOL) 1.25 MG (50000 UNIT) CAPS capsule Take 50,000 Units by mouth every 30 (thirty) days.    [provider]      Allergies    Patient has no known allergies.    Review of Systems   Review of Systems  Physical Exam Updated Vital Signs BP (!) 146/99   Pulse 92   Temp 98.5 F (36.9 C) (Oral)   Resp 18   Ht _0  (1.626 m)   Wt 90.7 kg   SpO2 92%   BMI 34.33 kg/m  Physical Exam Constitutional:      General: She is not in acute distress. HENT:     Head: Normocephalic and atraumatic.  Eyes:  Conjunctiva/sclera: Conjunctivae normal.     Pupils: Pupils are equal, round, and reactive to light.  Cardiovascular:     Rate and Rhythm: Normal rate and regular rhythm.  Pulmonary:     Effort: Pulmonary effort is normal. No respiratory distress.  Musculoskeletal:     Comments: Tenderness of the mid calcaneus with palpation of the dorsal surface of the left foot.  No plantar arch tenderness.  No erythema or fluctuance  Skin:    General: Skin is warm and dry.  Neurological:     General: No focal deficit present.     Mental Status: She is alert. Mental status is at baseline.  Psychiatric:        Mood and Affect: Mood normal.        Behavior: Behavior normal.     ED Results / Procedures / Treatments   Labs (all labs ordered are listed, but only abnormal results  are displayed) Labs Reviewed - No data to display  EKG None  Radiology No results found.  Procedures Procedures    Medications Ordered in ED Medications - No data to display  ED Course/ Medical Decision Making/ A&P                           Medical Decision Making  Patient is here with left foot pain ongoing for months.  I suspect this may be a tendinitis, possible bony spur that is symptomatic.  The position of this is not quite typical with plantar fasciitis.  I doubt infection or acute fracture to warrant emergent imaging at this time.  Because of her pain with walking I did offer her crutches and recommended she try to keep off of her foot, and I strongly recommended that she see podiatrist if she is looking for a more specialized opinion.  I explained the function of the emergency department to her.  She verbalized understanding.  Okay for discharge        Final Clinical Impression(s) / ED Diagnoses Final diagnoses:  Left foot pain    Rx / DC Orders ED Discharge Orders     None         Mary Robinson, Carola Rhine, MD 08/29/22 1744

## 2022-09-03 DIAGNOSIS — I10 Essential (primary) hypertension: Secondary | ICD-10-CM | POA: Diagnosis not present

## 2022-09-03 DIAGNOSIS — E1165 Type 2 diabetes mellitus with hyperglycemia: Secondary | ICD-10-CM | POA: Diagnosis not present

## 2022-09-05 ENCOUNTER — Ambulatory Visit: Payer: Medicare Other | Admitting: Orthopaedic Surgery

## 2022-09-05 ENCOUNTER — Encounter: Payer: Self-pay | Admitting: Orthopaedic Surgery

## 2022-09-05 VITALS — BP 152/96 | HR 79 | Ht 64.0 in | Wt 199.2 lb

## 2022-09-05 DIAGNOSIS — M722 Plantar fascial fibromatosis: Secondary | ICD-10-CM | POA: Diagnosis not present

## 2022-09-05 NOTE — Patient Instructions (Signed)
 Dr.Keeling is here all day on Tuesdays. He is here half a day on Wednesday mornings, and Thursday mornings. If you need anything such as a medication refill, please either call BEFORE the end of the day on [facility] or send a message through [place]. Your pharmacy can send a refill request for you. Calling by the end of the day on [facility] allows [place] time to send Dr.Keeling the request and for him to respond before he leaves on Thursdays.   My name is  and I assist [place]. If you need anything before your next appointment, please do not hesitate to call the office at [phone] and ask to leave a message for me. I will respond within 24-48 business hours.   Do the exercises he demonstrated several times a day.

## 2022-09-05 NOTE — Progress Notes (Signed)
Subjective:    Patient ID: Mary Robinson, female    DOB: 1956/05/13, 67 y.o.   MRN: [id number]  HPI She has several month history of heel pain on the left.  She went to urgent care 08-29-22 about this.  She has no trauma. She is better today. She has pain when first standing. She has no redness.  She has no change in shoe wear.  She has no other pains.  I have reviewed the urgent care notes.   Review of Systems  Constitutional:  Positive for activity change.  Respiratory:  Positive for shortness of breath.   Endocrine: Positive for cold intolerance and heat intolerance.  Musculoskeletal:  Positive for arthralgias and gait problem.  Allergic/Immunologic: Positive for environmental allergies.  All other systems reviewed and are negative. For Review of Systems, all other systems reviewed and are negative.  The following is a summary of the past history medically, past history surgically, known current medicines, social history and family history.  This information is gathered electronically by the computer from prior information and documentation.  I review this each visit and have found including this information at this point in the chart is beneficial and informative.   Past Medical History:  Diagnosis Date   Anemia    Anxiety    Arthritis    Asthma    Bursitis    Chronic back pain    Chronic constipation    Depression    Diastasis recti    Essential hypertension    GERD (gastroesophageal reflux disease)    Helicobacter pylori gastritis    Treated in May 2011   Hyperlipidemia    Obesity    S/P colonoscopy 12/2009   Multiple tubular adenomas: next TCS May 2014   S/P endoscopy 12/2009   Severe erosive reflux esophagitis, noncritial schatzki's ring, +h.pylori   Seasonal allergies    Sleep apnea    Type II diabetes mellitus ([place])    Umbilical hernia     Past Surgical History:  Procedure Laterality Date   CHOLECYSTECTOMY  1999   COLONOSCOPY  May 2011   Dr. Gala Romney:  long, torturous colon, multiple colonic polyps. TUBULAR ADENOMAS   COLONOSCOPY N/A 04/15/2013   normal rectum, torturous colon.  tubular adenomas. Surveillance due 2019.    DILATION AND CURETTAGE OF UTERUS  1970's   ESOPHAGOGASTRODUODENOSCOPY  May 2011   Dr. Gala Romney: severe erosive reflux esophagitis, non-critical Schatzki's ring, diffuse gastric erosions. H. PYLORI GASTRITIS   HERNIA REPAIR  05/04/2013   laparoscopic incisional hernia repair w/mesh per notes(05/04/2013)   INCISIONAL HERNIA REPAIR N/A 05/04/2013   Procedure: LAPAROSCOPIC INCISIONAL HERNIA ;  Surgeon: Adin Hector, MD;  Location: [place];  Service: General;  Laterality: N/A;   INSERTION OF MESH N/A 05/04/2013   Procedure: INSERTION OF MESH;  Surgeon: Adin Hector, MD;  Location: [facility];  Service: General;  Laterality: N/A;   TUBAL LIGATION  1970's    Current Outpatient Medications on File Prior to Visit  Medication Sig Dispense Refill   acetaminophen (TYLENOL) 500 MG tablet Take 500-1,000 mg by mouth every 6 (six) hours as needed for moderate pain.      Albuterol Sulfate, sensor, (PROAIR DIGIHALER) 108 (90 Base) MCG/ACT AEPB Inhale into the lungs as needed.     amLODipine (NORVASC) 10 MG tablet Take 10 mg by mouth daily with breakfast.      aspirin 81 MG tablet Take 81 mg by mouth daily.       Blood  Glucose Monitoring Suppl (ACCU-CHEK GUIDE ME) w/Device KIT 1 Piece by Does not apply route as directed. 1 kit 0   glucose blood (ACCU-CHEK GUIDE) test strip Test glucose 2 times daily. 150 each 2   hydrochlorothiazide (HYDRODIURIL) 25 MG tablet Take 25 mg by mouth daily.     loratadine (CLARITIN) 10 MG tablet Take 10 mg by mouth daily.       magnesium oxide (MAG-OX) 400 MG tablet Take 400 mg by mouth daily.      metFORMIN (GLUCOPHAGE) 500 MG tablet Take 1 tablet (500 mg total) by mouth 2 (two) times daily. 180 tablet 1   omeprazole (PRILOSEC) 20 MG capsule Take 20 mg by mouth daily with breakfast.      polyethylene glycol (MIRALAX /  GLYCOLAX) packet Take 17 g by mouth daily as needed (for constipation). 30 packet 5   simvastatin (ZOCOR) 20 MG tablet Take 20 mg by mouth daily.     spironolactone (ALDACTONE) 25 MG tablet Take 50 mg by mouth once.     Vitamin D, Ergocalciferol, (DRISDOL) 1.25 MG (50000 UNIT) CAPS capsule Take 50,000 Units by mouth every 30 (thirty) days.     No current facility-administered medications on file prior to visit.    Social History   Socioeconomic History   Marital status: Single    Spouse name: Not on file   Number of children: Not on file   Years of education: Not on file   Highest education level: Not on file  Occupational History   Occupation: retired  Tobacco Use   Smoking status: Never   Smokeless tobacco: Never  Vaping Use   Vaping Use: Never used  Substance and Sexual Activity   Alcohol use: No   Drug use: No   Sexual activity: Never    Birth control/protection: Post-menopausal  Other Topics Concern   Not on file  Social History Narrative   Not on file   Social Determinants of Health   Financial Resource Strain: Not on file  Food Insecurity: Not on file  Transportation Needs: Not on file  Physical Activity: Not on file  Stress: Not on file  Social Connections: Not on file  Intimate Partner Violence: Not on file    Family History  Problem Relation Age of Onset   Colon cancer Maternal Uncle    Colon cancer Paternal Uncle    COPD Mother    Hypertension Mother    Seizures Mother    Chronic Renal Failure Mother    Hyperlipidemia Mother    Stroke Mother    Kidney disease Mother    Heart failure Mother    Heart disease Father    Diabetes Sister    Hypertension Sister    Heart disease Sister    Diabetes Brother    Hypertension Brother    Hypertension Daughter    Hypertension Son    Diabetes Sister    Hypertension Sister    Diabetes Sister    Hypertension Sister     BP (!) 152/96   Pulse 79   Ht _0  (1.626 m)   Wt 199 lb 3.2 oz (90.4 kg)   BMI  34.19 kg/m   Body mass index is 34.19 kg/m.      Objective:   Physical Exam Vitals and nursing note reviewed. Exam conducted with a chaperone present.  Constitutional:      Appearance: She is well-developed.  HENT:     Head: Normocephalic and atraumatic.  Eyes:     Conjunctiva/sclera:  Conjunctivae normal.     Pupils: Pupils are equal, round, and reactive to light.  Cardiovascular:     Rate and Rhythm: Normal rate and regular rhythm.  Pulmonary:     Effort: Pulmonary effort is normal.  Abdominal:     Palpations: Abdomen is soft.  Musculoskeletal:     Cervical back: Normal range of motion and neck supple.       Feet:  Skin:    General: Skin is warm and dry.  Neurological:     Mental Status: She is alert and oriented to person, place, and time.     Cranial Nerves: No cranial nerve deficit.     Motor: No abnormal muscle tone.     Coordination: Coordination normal.     Deep Tendon Reflexes: Reflexes are normal and symmetric. Reflexes normal.  Psychiatric:        Behavior: Behavior normal.        Thought Content: Thought content normal.        Judgment: Judgment normal.           Assessment & Plan:   Encounter Diagnosis  Name Primary?   Plantar fasciitis, left Yes   I have explained what she has.  I have gone over stretching exercises to do.  Return in two weeks.  Call if any problem.  Precautions discussed.  Electronically Signed Sanjuana Kava, MD 1/4/202410:29 AM

## 2022-09-19 ENCOUNTER — Ambulatory Visit: Payer: Medicare Other | Admitting: Orthopaedic Surgery

## 2022-09-19 ENCOUNTER — Encounter: Payer: Self-pay | Admitting: Orthopaedic Surgery

## 2022-09-19 VITALS — BP 150/82 | HR 85 | Ht 64.0 in | Wt 200.0 lb

## 2022-09-19 DIAGNOSIS — M722 Plantar fascial fibromatosis: Secondary | ICD-10-CM

## 2022-09-19 NOTE — Progress Notes (Signed)
I am much better.  She has little pain of the left heel today.  She has no new trauma.  She is walking well.  Encounter Diagnosis  Name Primary?   Plantar fasciitis, left Yes   I will see as needed.  Continue the stretching exercises.  Call if any problem.  Precautions discussed.  Electronically Signed Sanjuana Kava, MD 1/18/20249:49 AM

## 2022-10-07 DIAGNOSIS — Z23 Encounter for immunization: Secondary | ICD-10-CM | POA: Diagnosis not present

## 2022-10-07 DIAGNOSIS — E1165 Type 2 diabetes mellitus with hyperglycemia: Secondary | ICD-10-CM | POA: Diagnosis not present

## 2022-10-07 DIAGNOSIS — Z1389 Encounter for screening for other disorder: Secondary | ICD-10-CM | POA: Diagnosis not present

## 2022-10-07 DIAGNOSIS — Z0001 Encounter for general adult medical examination with abnormal findings: Secondary | ICD-10-CM | POA: Diagnosis not present

## 2022-10-07 DIAGNOSIS — E785 Hyperlipidemia, unspecified: Secondary | ICD-10-CM | POA: Diagnosis not present

## 2022-10-07 DIAGNOSIS — K219 Gastro-esophageal reflux disease without esophagitis: Secondary | ICD-10-CM | POA: Diagnosis not present

## 2022-10-07 DIAGNOSIS — I10 Essential (primary) hypertension: Secondary | ICD-10-CM | POA: Diagnosis not present

## 2022-10-07 DIAGNOSIS — J4530 Mild persistent asthma, uncomplicated: Secondary | ICD-10-CM | POA: Diagnosis not present

## 2022-11-05 ENCOUNTER — Other Ambulatory Visit: Payer: Self-pay | Admitting: "Endocrinology

## 2022-11-05 DIAGNOSIS — I10 Essential (primary) hypertension: Secondary | ICD-10-CM | POA: Diagnosis not present

## 2022-11-05 DIAGNOSIS — E1165 Type 2 diabetes mellitus with hyperglycemia: Secondary | ICD-10-CM | POA: Diagnosis not present

## 2022-11-06 LAB — COMPREHENSIVE METABOLIC PANEL
ALT: 25 IU/L (ref 0–32)
AST: 22 IU/L (ref 0–40)
Albumin/Globulin Ratio: 1.5 (ref 1.2–2.2)
Albumin: 4.3 g/dL (ref 3.9–4.9)
Alkaline Phosphatase: 68 IU/L (ref 44–121)
BUN/Creatinine Ratio: 16 (ref 12–28)
BUN: 16 mg/dL (ref 8–27)
Bilirubin Total: <0.2 mg/dL (ref 0.0–1.2)
CO2: 26 mmol/L (ref 20–29)
Calcium: 9.2 mg/dL (ref 8.7–10.3)
Chloride: 102 mmol/L (ref 96–106)
Creatinine, Ser: 0.98 mg/dL (ref 0.57–1.00)
Globulin, Total: 2.9 g/dL (ref 1.5–4.5)
Glucose: 172 mg/dL — ABNORMAL HIGH (ref 70–99)
Potassium: 4.2 mmol/L (ref 3.5–5.2)
Sodium: 143 mmol/L (ref 134–144)
Total Protein: 7.2 g/dL (ref 6.0–8.5)
eGFR: 62 mL/min/{1.73_m2} (ref >59)

## 2022-11-11 ENCOUNTER — Ambulatory Visit (INDEPENDENT_AMBULATORY_CARE_PROVIDER_SITE_OTHER): Payer: Medicare Other | Admitting: "Endocrinology

## 2022-11-11 ENCOUNTER — Encounter: Payer: Self-pay | Admitting: "Endocrinology

## 2022-11-11 VITALS — BP 116/78 | HR 88 | Ht 64.0 in | Wt 204.6 lb

## 2022-11-11 DIAGNOSIS — E559 Vitamin D deficiency, unspecified: Secondary | ICD-10-CM | POA: Diagnosis not present

## 2022-11-11 DIAGNOSIS — E782 Mixed hyperlipidemia: Secondary | ICD-10-CM | POA: Diagnosis not present

## 2022-11-11 DIAGNOSIS — Z6834 Body mass index (BMI) 34.0-34.9, adult: Secondary | ICD-10-CM

## 2022-11-11 DIAGNOSIS — E1165 Type 2 diabetes mellitus with hyperglycemia: Secondary | ICD-10-CM

## 2022-11-11 DIAGNOSIS — I10 Essential (primary) hypertension: Secondary | ICD-10-CM | POA: Diagnosis not present

## 2022-11-11 DIAGNOSIS — E6609 Other obesity due to excess calories: Secondary | ICD-10-CM

## 2022-11-11 LAB — POCT GLYCOSYLATED HEMOGLOBIN (HGB A1C): HbA1c, POC (controlled diabetic range): 8.3 % — AB (ref 0.0–7.0)

## 2022-11-11 MED ORDER — DAPAGLIFLOZIN PROPANEDIOL 5 MG PO TABS: 5.0000 mg | ORAL_TABLET | Freq: Every day | ORAL | 1 refills | Status: DC

## 2022-11-11 NOTE — Progress Notes (Signed)
11/11/2022, 3:11 PM   Endocrinology follow-up note  Subjective:    Patient ID: Mary Robinson, female    DOB: [birth year].  Mary Robinson is being seen in follow-up after Mary was seen in consultation for management of currently uncontrolled symptomatic diabetes requested by  Carrolyn Meiers, MD.   Past Medical History:  Diagnosis Date   Anemia    Anxiety    Arthritis    Asthma    Bursitis    Chronic back pain    Chronic constipation    Depression    Diastasis recti    Essential hypertension    GERD (gastroesophageal reflux disease)    Helicobacter pylori gastritis    Treated in May 2011   Hyperlipidemia    Obesity    S/P colonoscopy 12/2009   Multiple tubular adenomas: next TCS May 2014   S/P endoscopy 12/2009   Severe erosive reflux esophagitis, noncritial schatzki's ring, +h.pylori   Seasonal allergies    Sleep apnea    Type II diabetes mellitus ([place])    Umbilical hernia     Past Surgical History:  Procedure Laterality Date   CHOLECYSTECTOMY  1999   COLONOSCOPY  May 2011   Dr. Gala Romney: long, torturous colon, multiple colonic polyps. TUBULAR ADENOMAS   COLONOSCOPY N/A 04/15/2013   normal rectum, torturous colon.  tubular adenomas. Surveillance due 2019.    DILATION AND CURETTAGE OF UTERUS  1970's   ESOPHAGOGASTRODUODENOSCOPY  May 2011   Dr. Gala Romney: severe erosive reflux esophagitis, non-critical Schatzki's ring, diffuse gastric erosions. H. PYLORI GASTRITIS   HERNIA REPAIR  05/04/2013   laparoscopic incisional hernia repair w/mesh per notes(05/04/2013)   INCISIONAL HERNIA REPAIR N/A 05/04/2013   Procedure: LAPAROSCOPIC INCISIONAL HERNIA ;  Surgeon: Adin Hector, MD;  Location: [place];  Service: General;  Laterality: N/A;   INSERTION OF MESH N/A 05/04/2013   Procedure: INSERTION OF MESH;  Surgeon: Adin Hector, MD;  Location: [place];  Service: General;  Laterality: N/A;    TUBAL LIGATION  1970's    Social History   Socioeconomic History   Marital status: Single    Spouse name: Not on file   Number of children: Not on file   Years of education: Not on file   Highest education level: Not on file  Occupational History   Occupation: retired  Tobacco Use   Smoking status: Never   Smokeless tobacco: Never  Vaping Use   Vaping Use: Never used  Substance and Sexual Activity   Alcohol use: No   Drug use: No   Sexual activity: Never    Birth control/protection: Post-menopausal  Other Topics Concern   Not on file  Social History Narrative   Not on file   Social Determinants of Health   Financial Resource Strain: Not on file  Food Insecurity: Not on file  Transportation Needs: Not on file  Physical Activity: Not on file  Stress: Not on file  Social Connections: Not on file    Family History  Problem Relation Age of Onset   Colon cancer Maternal Uncle    Colon cancer Paternal Uncle  COPD Mother    Hypertension Mother    Seizures Mother    Chronic Renal Failure Mother    Hyperlipidemia Mother    Stroke Mother    Kidney disease Mother    Heart failure Mother    Heart disease Father    Diabetes Sister    Hypertension Sister    Heart disease Sister    Diabetes Brother    Hypertension Brother    Hypertension Daughter    Hypertension Son    Diabetes Sister    Hypertension Sister    Diabetes Sister    Hypertension Sister     Outpatient Encounter Robinson as of 11/11/2022  Medication Sig   dapagliflozin propanediol (FARXIGA) 5 MG TABS tablet Take 1 tablet (5 mg total) by mouth daily before breakfast.   acetaminophen (TYLENOL) 500 MG tablet Take 500-1,000 mg by mouth every 6 (six) hours as needed for moderate pain.    Albuterol Sulfate, sensor, (PROAIR DIGIHALER) 108 (90 Base) MCG/ACT AEPB Inhale into the lungs as needed.   amLODipine (NORVASC) 10 MG tablet Take 10 mg by mouth daily with breakfast.    aspirin 81 MG tablet Take 81 mg  by mouth daily.     Blood Glucose Monitoring Suppl (ACCU-CHEK GUIDE ME) w/Device KIT 1 Piece by Does not apply route as directed.   glucose blood (ACCU-CHEK GUIDE) test strip Test glucose 2 times daily.   hydrochlorothiazide (HYDRODIURIL) 25 MG tablet Take 25 mg by mouth daily.   loratadine (CLARITIN) 10 MG tablet Take 10 mg by mouth daily.     magnesium oxide (MAG-OX) 400 MG tablet Take 400 mg by mouth daily.    metFORMIN (GLUCOPHAGE) 500 MG tablet TAKE 1 TABLET BY MOUTH TWICE A DAY   omeprazole (PRILOSEC) 20 MG capsule Take 20 mg by mouth daily with breakfast.    polyethylene glycol (MIRALAX / GLYCOLAX) packet Take 17 g by mouth daily as needed (for constipation).   simvastatin (ZOCOR) 20 MG tablet Take 20 mg by mouth daily.   spironolactone (ALDACTONE) 25 MG tablet Take 50 mg by mouth once. (Patient not taking: Reported on 11/11/2022)   Vitamin D, Ergocalciferol, (DRISDOL) 1.25 MG (50000 UNIT) CAPS capsule Take 50,000 Units by mouth every 30 (thirty) days.   No facility-administered encounter Robinson on file as of 11/11/2022.    ALLERGIES: No Known Allergies  VACCINATION STATUS: Immunization History  Administered Date(s) Administered   Influenza-Unspecified 06/29/2014, 07/03/2018    Diabetes Mary presents for her follow-up diabetic visit. Mary has type 2 diabetes mellitus. Her disease course has been worsening. There are no hypoglycemic associated symptoms. Pertinent negatives for hypoglycemia include no confusion, headaches, pallor or seizures. Pertinent negatives for diabetes include no chest pain, no fatigue, no polydipsia, no polyphagia and no polyuria. There are no hypoglycemic complications. Symptoms are worsening. Risk factors for coronary artery disease include dyslipidemia, diabetes mellitus, family history, obesity, hypertension, sedentary lifestyle and post-menopausal. Current diabetic treatments: Mary is currently on Metformin 1000 mg p.o. twice daily, Tradjenta 5 mg p.o.  daily. Her weight is increasing steadily. Mary is following a generally unhealthy diet. When asked about meal planning, Mary reported none. Mary has not had a previous visit with a dietitian. Mary rarely participates in exercise. Her home blood glucose trend is increasing steadily. Her breakfast blood glucose range is generally 140-180 mg/dl. Her overall blood glucose range is 140-180 mg/dl. Mary Robinson presents with her Robinson showing average blood glucose of 164 at fasting for the last 90 days.  Her point-of-care  A1c is 8.3%.  Mary did not document any hypoglycemia.  Her A1c is worsening from 6.9% during her last visit.    ) An ACE inhibitor/angiotensin II receptor blocker is being taken. Mary does not see a podiatrist.Eye exam is current.  Hyperlipidemia This is a chronic problem. The current episode started more than 1 year ago. The problem is uncontrolled. Exacerbating diseases include diabetes and obesity. Pertinent negatives include no chest pain, myalgias or shortness of breath. Current antihyperlipidemic treatment includes statins. Risk factors for coronary artery disease include diabetes mellitus, dyslipidemia, hypertension, a sedentary lifestyle, post-menopausal, obesity and family history.  Hypertension This is a chronic problem. The current episode started more than 1 year ago. The problem is controlled. Pertinent negatives include no chest pain, headaches, palpitations or shortness of breath. Risk factors for coronary artery disease include dyslipidemia, diabetes mellitus, obesity, sedentary lifestyle, family history and post-menopausal state. Past treatments include angiotensin blockers and calcium channel blockers.     Review of Systems  Constitutional:  Negative for chills, fatigue, fever and unexpected weight change.  HENT:  Negative for trouble swallowing and voice change.   Eyes:  Negative for visual disturbance.  Respiratory:  Negative for cough, shortness of breath and wheezing.    Cardiovascular:  Negative for chest pain, palpitations and leg swelling.  Gastrointestinal:  Negative for diarrhea, nausea and vomiting.  Endocrine: Negative for cold intolerance, heat intolerance, polydipsia, polyphagia and polyuria.  Musculoskeletal:  Negative for arthralgias and myalgias.  Skin:  Negative for color change, pallor, rash and wound.  Neurological:  Negative for seizures and headaches.  Psychiatric/Behavioral:  Negative for confusion and suicidal ideas.     Objective:       11/11/2022    9:55 AM 09/19/2022    9:38 AM 09/05/2022   10:01 AM  Vitals with BMI  Height '5\' 4"'$  '5\' 4"'$  '5\' 4"'$   Weight 204 lbs 10 oz 200 lbs 199 lbs 3 oz  BMI 35.1 [id number]  Systolic [id number]  Diastolic 78 82 96  Pulse 88 85 79    BP 116/78   Pulse 88   Ht '5\' 4"'$  (1.626 m)   Wt 204 lb 9.6 oz (92.8 kg)   BMI 35.12 kg/m   Wt Readings from Last 3 Encounters:  11/11/22 204 lb 9.6 oz (92.8 kg)  09/19/22 200 lb (90.7 kg)  09/05/22 199 lb 3.2 oz (90.4 kg)      CMP ( most recent) CMP     Component Value Date/Time   NA 143 11/05/2022 1344   K 4.2 11/05/2022 1344   CL 102 11/05/2022 1344   CO2 26 11/05/2022 1344   GLUCOSE 172 (H) 11/05/2022 1344   GLUCOSE 152 (H) 11/09/2019 1020   BUN 16 11/05/2022 1344   CREATININE 0.98 11/05/2022 1344   CREATININE 1.05 (H) 11/09/2019 1020   CALCIUM 9.2 11/05/2022 1344   PROT 7.2 11/05/2022 1344   ALBUMIN 4.3 11/05/2022 1344   AST 22 11/05/2022 1344   ALT 25 11/05/2022 1344   ALKPHOS 68 11/05/2022 1344   BILITOT <0.2 11/05/2022 1344   GFRNONAA 59 10/10/2020 0000   GFRNONAA 55 (L) 11/09/2019 1020   GFRAA 69 10/10/2020 0000   GFRAA 64 11/09/2019 1020     Diabetic Labs (most recent): Lab Results  Component Value Date   HGBA1C 8.3 (A) 11/11/2022   HGBA1C 6.9 05/13/2022   HGBA1C 6.8 01/08/2022     Lipid Panel ( most recent) Lipid Panel     Component  Value Date/Time   CHOL 141 05/08/2022 0904   TRIG 108 05/08/2022 0904   HDL 54  05/08/2022 0904   CHOLHDL 2.6 05/08/2022 0904   LDLCALC 67 05/08/2022 0904   LABVLDL 20 05/08/2022 0904      Assessment & Plan:   1. 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  Mary Robinson showing average blood glucose of 164 at fasting for the last 90 days.  Her point-of-care A1c is 8.3%.  Mary did not document any hypoglycemia.  Her A1c is worsening from 6.9% during her last visit.    -Recent labs reviewed. - I had a long discussion with her about the progressive nature of diabetes and the pathology behind its complications. -her diabetes is complicated by obesity/sedentary life and Mary remains at a high risk for more acute and chronic complications which include CAD, CVA, CKD, retinopathy, and neuropathy. These are all discussed in detail with her.  - I have counseled her on diet  and weight management  by adopting a carbohydrate restricted/protein rich diet. Patient is encouraged to switch to  unprocessed or minimally processed     complex starch and increased protein intake (animal or plant source), fruits, and vegetables. -  Mary is advised to stick to a routine mealtimes to eat 3 meals  a day and avoid unnecessary snacks ( to snack only to correct hypoglycemia).   - Mary acknowledges that there is a room for improvement in her food and drink choices. - Suggestion is made for her to avoid simple carbohydrates  from her diet including Cakes, Sweet Desserts, Ice Cream, Soda (diet and regular), Sweet Tea, Candies, Chips, Cookies, Store Bought Juices, Alcohol , Artificial Sweeteners,  Coffee Creamer, and "Sugar-free" Products, Lemonade. This will help patient to have more stable blood glucose profile and potentially avoid unintended weight gain.  The following Lifestyle Medicine recommendations according to [place]  [facility]) were discussed and and  offered to patient and Mary  agrees to start the journey:  A. Whole Foods, Plant-Based Nutrition comprising of fruits and vegetables, plant-based proteins, whole-grain carbohydrates was discussed in detail with the patient.   A list for source of those nutrients were also provided to the patient.  Patient will use only water or unsweetened tea for hydration. B.  The need to stay away from risky substances including alcohol, smoking; obtaining 7 to 9 hours of restorative sleep, at least 150 minutes of moderate intensity exercise weekly, the importance of healthy social connections,  and stress management techniques were discussed. C.  A full color page of  Calorie density of various food groups per pound showing examples of each food groups was provided to the patient.    - Mary has been scheduled with Jearld Fenton, RDN, CDE for diabetes education.  - I have approached her with the following individualized plan to manage  her diabetes and patient agrees:   Mary will need an additional intervention based on her presentation with A1c of 8.3%.  Mary did not tolerate Rybelsus in the past.  Mary is advised to continue metformin 500 mg p.o. twice daily.  Mary may benefit from an SGLT2 inhibitors.  I discussed and added Farxiga 5 mg p.o. daily at breakfast.  Side effects and precautions discussed with her.  Mary is advised to continue  to monitor blood glucose at least once a day-daily  before breakfast.    - Mary is encouraged to call clinic for blood glucose levels less than 70 or above 200 mg per DL at fasting .   - Specific targets for  A1c;  LDL, HDL,  and Triglycerides were discussed with the patient.  2) Blood Pressure /Hypertension: -Her blood pressure is controlled to target.  Mary Robinson including amlodipine 10 mg p.o. daily, losartan 100 mg p.o. daily with breakfast.     3) Lipids/Hyperlipidemia:   Review of her recent lipid panel showed  controlled  LDL at  63 .  Mary  is advised to continue simvastatin 20 mg p.o. daily at bedtime.  Side effects and precautions discussed with her.    4)  Weight/Diet:  Body mass index is 35.12 kg/m.  -Mary presents with progressive weight gain.     Mary is  a candidate for more weight loss. I discussed with her the fact that loss of 5 - 10% of her  current body weight will have the most impact on her diabetes management.  Exercise, and detailed carbohydrates information provided  -  detailed on discharge instructions.  5) Chronic Care/Health Maintenance:  -Mary   on ACEI/ARB and Statin Robinson and  is encouraged to initiate and continue to follow up with Ophthalmology, Dentist,  Podiatrist at least yearly or according to recommendations, and advised to   stay away from smoking. I have recommended yearly flu vaccine and pneumonia vaccine at least every 5 years; moderate intensity exercise for up to 150 minutes weekly; and  sleep for at least 7 hours a day.  - Mary is  advised to maintain close follow up with Carrolyn Meiers, MD for primary care needs, as well as her other providers for optimal and coordinated care.   I spent  26  minutes in the care of the patient today including review of labs from [place], Lipids, Thyroid Function, Hematology (current and previous including abstractions from other facilities); face-to-face time discussing  her blood glucose readings/logs, discussing hypoglycemia and hyperglycemia episodes and symptoms, Robinson doses, her options of short and long term treatment based on the latest standards of care / guidelines;  discussion about incorporating lifestyle medicine;  and documenting the encounter. Risk reduction counseling performed per USPSTF guidelines to reduce  obesity and cardiovascular risk factors.     Please refer to Patient Instructions for Blood Glucose Monitoring and Insulin/Robinson Dosing Guide"  in media tab for additional information. Please  also refer to " Patient  Self Inventory" in the Media  tab for reviewed elements of pertinent patient history.  Mary Robinson, expressed understanding, and voiced agreement with the above plans.  All questions were answered to her satisfaction. Mary is encouraged to contact clinic should Mary have any questions or concerns prior to her return visit.  Follow up plan: - Return in about 3 months (around 02/11/2023) for Bring Robinson/CGM Device/Logs- A1c in Office.  Glade Lloyd, MD [facility] [address] [place], [place] [address] Phone: [phone]  Fax: [phone]    11/11/2022, 3:11 PM  This note was partially dictated with voice recognition software. Similar sounding words can be transcribed inadequately or may not  be corrected upon review.

## 2022-11-11 NOTE — Patient Instructions (Signed)

## 2022-12-06 DIAGNOSIS — E1165 Type 2 diabetes mellitus with hyperglycemia: Secondary | ICD-10-CM | POA: Diagnosis not present

## 2022-12-06 DIAGNOSIS — I10 Essential (primary) hypertension: Secondary | ICD-10-CM | POA: Diagnosis not present

## 2022-12-09 ENCOUNTER — Other Ambulatory Visit: Payer: Self-pay | Admitting: "Endocrinology

## 2022-12-09 ENCOUNTER — Telehealth: Payer: Self-pay | Admitting: "Endocrinology

## 2022-12-09 MED ORDER — DAPAGLIFLOZIN PROPANEDIOL 10 MG PO TABS: 10.0000 mg | ORAL_TABLET | Freq: Every day | ORAL | 1 refills | Status: DC

## 2022-12-09 NOTE — Telephone Encounter (Signed)
Can you call in a new RX for her Mary Robinson for 10 mg, since you increased it with her readings she brought by today. [facility]

## 2022-12-16 DIAGNOSIS — B3731 Acute candidiasis of vulva and vagina: Secondary | ICD-10-CM | POA: Diagnosis not present

## 2022-12-16 DIAGNOSIS — E119 Type 2 diabetes mellitus without complications: Secondary | ICD-10-CM | POA: Diagnosis not present

## 2022-12-17 ENCOUNTER — Encounter: Payer: Self-pay | Admitting: "Endocrinology

## 2022-12-17 ENCOUNTER — Ambulatory Visit: Payer: Medicare Other | Admitting: "Endocrinology

## 2022-12-17 VITALS — BP 124/76 | HR 88 | Ht 64.0 in | Wt 199.2 lb

## 2022-12-17 DIAGNOSIS — E6609 Other obesity due to excess calories: Secondary | ICD-10-CM | POA: Diagnosis not present

## 2022-12-17 DIAGNOSIS — E1165 Type 2 diabetes mellitus with hyperglycemia: Secondary | ICD-10-CM

## 2022-12-17 DIAGNOSIS — E782 Mixed hyperlipidemia: Secondary | ICD-10-CM | POA: Diagnosis not present

## 2022-12-17 DIAGNOSIS — Z6834 Body mass index (BMI) 34.0-34.9, adult: Secondary | ICD-10-CM

## 2022-12-17 DIAGNOSIS — I10 Essential (primary) hypertension: Secondary | ICD-10-CM

## 2022-12-17 DIAGNOSIS — H35033 Hypertensive retinopathy, bilateral: Secondary | ICD-10-CM | POA: Diagnosis not present

## 2022-12-17 MED ORDER — METFORMIN HCL 500 MG PO TABS: 1000.0000 mg | ORAL_TABLET | Freq: Two times a day (BID) | ORAL | 3 refills | Status: DC

## 2022-12-17 NOTE — Patient Instructions (Signed)
                                     Advice for Weight Management  -For most of us the best way to lose weight is by diet management. Generally speaking, diet management means consuming less calories intentionally which over time brings about progressive weight loss.  This can be achieved more effectively by avoiding ultra processed carbohydrates, processed meats, unhealthy fats.    It is critically important to know your numbers: how much calorie you are consuming and how much calorie you need. More importantly, our carbohydrates sources should be unprocessed naturally occurring  complex starch food items.  It is always important to balance nutrition also by  appropriate intake of proteins (mainly plant-based), healthy fats/oils, plenty of fruits and vegetables.   -The American College of Lifestyle Medicine (ACL M) recommends nutrition derived mostly from Whole Food, Plant Predominant Sources example an apple instead of applesauce or apple pie. Eat Plenty of vegetables, Mushrooms, fruits, Legumes, Whole Grains, Nuts, seeds in lieu of processed meats, processed snacks/pastries red meat, poultry, eggs.  Use only water or unsweetened tea for hydration.  The College also recommends the need to stay away from risky substances including alcohol, smoking; obtaining 7-9 hours of restorative sleep, at least 150 minutes of moderate intensity exercise weekly, importance of healthy social connections, and being mindful of stress and seek help when it is overwhelming.    -Sticking to a routine mealtime to eat 3 meals a day and avoiding unnecessary snacks is shown to have a big role in weight control. Under normal circumstances, the only time we burn stored energy is when we are hungry, so allow  some hunger to take place- hunger means no food between appropriate meal times, only water.  It is not advisable to starve.   -It is better to avoid simple carbohydrates including:  Cakes, Sweet Desserts, Ice Cream, Soda (diet and regular), Sweet Tea, Candies, Chips, Cookies, Store Bought Juices, Alcohol in Excess of  1-2 drinks a day, Lemonade,  Artificial Sweeteners, Doughnuts, Coffee Creamers, "Sugar-free" Products, etc, etc.  This is not a complete list.....    -Consulting with certified diabetes educators is proven to provide you with the most accurate and current information on diet.  Also, you may be  interested in discussing diet options/exchanges , we can schedule a visit with Mary Robinson, RDN, CDE for individualized nutrition education.  -Exercise: If you are able: 30 -60 minutes a day ,4 days a week, or 150 minutes of moderate intensity exercise weekly.    The longer the better if tolerated.  Combine stretch, strength, and aerobic activities.  If you were told in the past that you have high risk for cardiovascular diseases, or if you are currently symptomatic, you may seek evaluation by your heart doctor prior to initiating moderate to intense exercise programs.                                  Additional Care Considerations for Diabetes/Prediabetes   -Diabetes  is a chronic disease.  The most important care consideration is regular follow-up with your diabetes care provider with the goal being avoiding or delaying its complications and to take advantage of advances in medications and technology.  If appropriate actions are taken early enough, type 2 diabetes can even be   reversed.  Seek information from the right source.  - Whole Food, Plant Predominant Nutrition is highly recommended: Eat Plenty of vegetables, Mushrooms, fruits, Legumes, Whole Grains, Nuts, seeds in lieu of processed meats, processed snacks/pastries red meat, poultry, eggs as recommended by American College of  Lifestyle Medicine (ACLM).  -Type 2 diabetes is known to coexist with other important comorbidities such as high blood pressure and high cholesterol.  It is critical to control not only the  diabetes but also the high blood pressure and high cholesterol to minimize and delay the risk of complications including coronary artery disease, stroke, amputations, blindness, etc.  The good news is that this diet recommendation for type 2 diabetes is also very helpful for managing high cholesterol and high blood blood pressure.  - Studies showed that people with diabetes will benefit from a class of medications known as ACE inhibitors and statins.  Unless there are specific reasons not to be on these medications, the standard of care is to consider getting one from these groups of medications at an optimal doses.  These medications are generally considered safe and proven to help protect the heart and the kidneys.    - People with diabetes are encouraged to initiate and maintain regular follow-up with eye doctors, foot doctors, dentists , and if necessary heart and kidney doctors.     - It is highly recommended that people with diabetes quit smoking or stay away from smoking, and get yearly  flu vaccine and pneumonia vaccine at least every 5 years.  See above for additional recommendations on exercise, sleep, stress management , and healthy social connections.      

## 2022-12-17 NOTE — Progress Notes (Signed)
12/17/2022, 6:28 PM   Endocrinology follow-up note  Subjective:    Patient ID: Mary Robinson, female    DOB: 08-13-1956.  Mary Robinson is being seen in follow-up after she was seen in consultation for management of currently uncontrolled symptomatic diabetes requested by  Benetta Spar, MD.   Past Medical History:  Diagnosis Date  . Anemia   . Anxiety   . Arthritis   . Asthma   . Bursitis   . Chronic back pain   . Chronic constipation   . Depression   . Diastasis recti   . Essential hypertension   . GERD (gastroesophageal reflux disease)   . Helicobacter pylori gastritis    Treated in May 2011  . Hyperlipidemia   . Obesity   . S/P colonoscopy 12/2009   Multiple tubular adenomas: next TCS May 2014  . S/P endoscopy 12/2009   Severe erosive reflux esophagitis, noncritial schatzki's ring, +h.pylori  . Seasonal allergies   . Sleep apnea   . Type II diabetes mellitus   . Umbilical hernia     Past Surgical History:  Procedure Laterality Date  . CHOLECYSTECTOMY  1999  . COLONOSCOPY  May 2011   Dr. Jena Gauss: long, torturous colon, multiple colonic polyps. TUBULAR ADENOMAS  . COLONOSCOPY N/A 04/15/2013   normal rectum, torturous colon.  tubular adenomas. Surveillance due 2019.   Marland Kitchen DILATION AND CURETTAGE OF UTERUS  1970's  . ESOPHAGOGASTRODUODENOSCOPY  May 2011   Dr. Jena Gauss: severe erosive reflux esophagitis, non-critical Schatzki's ring, diffuse gastric erosions. H. PYLORI GASTRITIS  . HERNIA REPAIR  05/04/2013   laparoscopic incisional hernia repair w/mesh per notes(05/04/2013)  . INCISIONAL HERNIA REPAIR N/A 05/04/2013   Procedure: LAPAROSCOPIC INCISIONAL HERNIA ;  Surgeon: Ernestene Mention, MD;  Location: [facility] OR;  Service: General;  Laterality: N/A;  . INSERTION OF MESH N/A 05/04/2013   Procedure: INSERTION OF MESH;  Surgeon: Ernestene Mention, MD;  Location: [facility] OR;  Service: General;   Laterality: N/A;  . TUBAL LIGATION  1970's    Social History   Socioeconomic History  . Marital status: Single    Spouse name: Not on file  . Number of children: Not on file  . Years of education: Not on file  . Highest education level: Not on file  Occupational History  . Occupation: retired  Tobacco Use  . Smoking status: Never  . Smokeless tobacco: Never  Vaping Use  . Vaping Use: Never used  Substance and Sexual Activity  . Alcohol use: No  . Drug use: No  . Sexual activity: Never    Birth control/protection: Post-menopausal  Other Topics Concern  . Not on file  Social History Narrative  . Not on file   Social Determinants of Health   Financial Resource Strain: Not on file  Food Insecurity: Not on file  Transportation Needs: Not on file  Physical Activity: Not on file  Stress: Not on file  Social Connections: Not on file    Family History  Problem Relation Age of Onset  . Colon cancer Maternal Uncle   . Colon cancer Paternal Uncle   .  COPD Mother   . Hypertension Mother   . Seizures Mother   . Chronic Renal Failure Mother   . Hyperlipidemia Mother   . Stroke Mother   . Kidney disease Mother   . Heart failure Mother   . Heart disease Father   . Diabetes Sister   . Hypertension Sister   . Heart disease Sister   . Diabetes Brother   . Hypertension Brother   . Hypertension Daughter   . Hypertension Son   . Diabetes Sister   . Hypertension Sister   . Diabetes Sister   . Hypertension Sister     Outpatient Encounter Medications as of 12/17/2022  Medication Sig  . acetaminophen (TYLENOL) 500 MG tablet Take 500-1,000 mg by mouth every 6 (six) hours as needed for moderate pain.   . Albuterol Sulfate, sensor, (PROAIR DIGIHALER) 108 (90 Base) MCG/ACT AEPB Inhale into the lungs as needed.  Marland Kitchen amLODipine (NORVASC) 10 MG tablet Take 10 mg by mouth daily with breakfast.   . aspirin 81 MG tablet Take 81 mg by mouth daily.    . Blood Glucose Monitoring Suppl  (ACCU-CHEK GUIDE ME) w/Device KIT 1 Piece by Does not apply route as directed.  Marland Kitchen glucose blood (ACCU-CHEK GUIDE) test strip Test glucose 2 times daily.  . hydrochlorothiazide (HYDRODIURIL) 25 MG tablet Take 25 mg by mouth daily.  Marland Kitchen loratadine (CLARITIN) 10 MG tablet Take 10 mg by mouth daily.    . magnesium oxide (MAG-OX) 400 MG tablet Take 400 mg by mouth daily.   . metFORMIN (GLUCOPHAGE) 500 MG tablet Take 2 tablets (1,000 mg total) by mouth 2 (two) times daily with a meal.  . omeprazole (PRILOSEC) 20 MG capsule Take 20 mg by mouth daily with breakfast.   . polyethylene glycol (MIRALAX / GLYCOLAX) packet Take 17 g by mouth daily as needed (for constipation).  . simvastatin (ZOCOR) 20 MG tablet Take 20 mg by mouth daily.  Marland Kitchen spironolactone (ALDACTONE) 25 MG tablet Take 50 mg by mouth once. (Patient not taking: Reported on 11/11/2022)  . Vitamin D, Ergocalciferol, (DRISDOL) 1.25 MG (50000 UNIT) CAPS capsule Take 50,000 Units by mouth every 30 (thirty) days.  . [DISCONTINUED] dapagliflozin propanediol (FARXIGA) 10 MG TABS tablet Take 1 tablet (10 mg total) by mouth daily before breakfast. (Patient not taking: Reported on 12/17/2022)  . [DISCONTINUED] metFORMIN (GLUCOPHAGE) 500 MG tablet TAKE 1 TABLET BY MOUTH TWICE A DAY (Patient taking differently: Take 1,000 mg by mouth 2 (two) times daily with a meal.)   No facility-administered encounter medications on file as of 12/17/2022.    ALLERGIES: No Known Allergies  VACCINATION STATUS: Immunization History  Administered Date(s) Administered  . Influenza-Unspecified 06/29/2014, 07/03/2018    Diabetes She presents for her follow-up diabetic visit. She has type 2 diabetes mellitus. Her disease course has been worsening. There are no hypoglycemic associated symptoms. Pertinent negatives for hypoglycemia include no confusion, headaches, pallor or seizures. Pertinent negatives for diabetes include no chest pain, no fatigue, no polydipsia, no polyphagia  and no polyuria. There are no hypoglycemic complications. Symptoms are worsening. Risk factors for coronary artery disease include dyslipidemia, diabetes mellitus, family history, obesity, hypertension, sedentary lifestyle and post-menopausal. Current diabetic treatments: She is currently on Metformin 1000 mg p.o. twice daily, Tradjenta 5 mg p.o. daily. Her weight is increasing steadily. She is following a generally unhealthy diet. When asked about meal planning, she reported none. She has not had a previous visit with a dietitian. She rarely participates in exercise. Her  home blood glucose trend is increasing steadily. Her breakfast blood glucose range is generally 140-180 mg/dl. Her overall blood glucose range is 140-180 mg/dl. Donnell presents with her meter showing average blood glucose of 164 at fasting for the last 90 days.  Her point-of-care A1c is 8.3%.  She did not document any hypoglycemia.  Her A1c is worsening from 6.9% during her last visit.    ) An ACE inhibitor/angiotensin II receptor blocker is being taken. She does not see a podiatrist.Eye exam is current.  Hyperlipidemia This is a chronic problem. The current episode started more than 1 year ago. The problem is uncontrolled. Exacerbating diseases include diabetes and obesity. Pertinent negatives include no chest pain, myalgias or shortness of breath. Current antihyperlipidemic treatment includes statins. Risk factors for coronary artery disease include diabetes mellitus, dyslipidemia, hypertension, a sedentary lifestyle, post-menopausal, obesity and family history.  Hypertension This is a chronic problem. The current episode started more than 1 year ago. The problem is controlled. Pertinent negatives include no chest pain, headaches, palpitations or shortness of breath. Risk factors for coronary artery disease include dyslipidemia, diabetes mellitus, obesity, sedentary lifestyle, family history and post-menopausal state. Past treatments  include angiotensin blockers and calcium channel blockers.     Review of Systems  Constitutional:  Negative for chills, fatigue, fever and unexpected weight change.  HENT:  Negative for trouble swallowing and voice change.   Eyes:  Negative for visual disturbance.  Respiratory:  Negative for cough, shortness of breath and wheezing.   Cardiovascular:  Negative for chest pain, palpitations and leg swelling.  Gastrointestinal:  Negative for diarrhea, nausea and vomiting.  Endocrine: Negative for cold intolerance, heat intolerance, polydipsia, polyphagia and polyuria.  Musculoskeletal:  Negative for arthralgias and myalgias.  Skin:  Negative for color change, pallor, rash and wound.  Neurological:  Negative for seizures and headaches.  Psychiatric/Behavioral:  Negative for confusion and suicidal ideas.     Objective:       12/17/2022    4:02 PM 11/11/2022    9:55 AM 09/19/2022    9:38 AM  Vitals with BMI  Height 5\' 4"  5\' 4"  5\' 4"   Weight 199 lbs 3 oz 204 lbs 10 oz 200 lbs  BMI 34.18 35.1 34.31  Systolic 124 116 147  Diastolic 76 78 82  Pulse 88 88 85    BP 124/76   Pulse 88   Ht 5\' 4"  (1.626 m)   Wt 199 lb 3.2 oz (90.4 kg)   BMI 34.19 kg/m   Wt Readings from Last 3 Encounters:  12/17/22 199 lb 3.2 oz (90.4 kg)  11/11/22 204 lb 9.6 oz (92.8 kg)  09/19/22 200 lb (90.7 kg)      CMP ( most recent) CMP     Component Value Date/Time   NA 143 11/05/2022 1344   K 4.2 11/05/2022 1344   CL 102 11/05/2022 1344   CO2 26 11/05/2022 1344   GLUCOSE 172 (H) 11/05/2022 1344   GLUCOSE 152 (H) 11/09/2019 1020   BUN 16 11/05/2022 1344   CREATININE 0.98 11/05/2022 1344   CREATININE 1.05 (H) 11/09/2019 1020   CALCIUM 9.2 11/05/2022 1344   PROT 7.2 11/05/2022 1344   ALBUMIN 4.3 11/05/2022 1344   AST 22 11/05/2022 1344   ALT 25 11/05/2022 1344   ALKPHOS 68 11/05/2022 1344   BILITOT <0.2 11/05/2022 1344   GFRNONAA 59 10/10/2020 0000   GFRNONAA 55 (L) 11/09/2019 1020   GFRAA 69  10/10/2020 0000   GFRAA 64 11/09/2019  1020     Diabetic Labs (most recent): Lab Results  Component Value Date   HGBA1C 8.3 (A) 11/11/2022   HGBA1C 6.9 05/13/2022   HGBA1C 6.8 01/08/2022     Lipid Panel ( most recent) Lipid Panel     Component Value Date/Time   CHOL 141 05/08/2022 0904   TRIG 108 05/08/2022 0904   HDL 54 05/08/2022 0904   CHOLHDL 2.6 05/08/2022 0904   LDLCALC 67 05/08/2022 0904   LABVLDL 20 05/08/2022 0904      Assessment & Plan:   1. 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  Ashawna presents with her meter showing average blood glucose of 164 at fasting for the last 90 days.  Her point-of-care A1c is 8.3%.  She did not document any hypoglycemia.  Her A1c is worsening from 6.9% during her last visit.    -Recent labs reviewed. - I had a long discussion with her about the progressive nature of diabetes and the pathology behind its complications. -her diabetes is complicated by obesity/sedentary life and she remains at a high risk for more acute and chronic complications which include CAD, CVA, CKD, retinopathy, and neuropathy. These are all discussed in detail with her.  - I have counseled her on diet  and weight management  by adopting a carbohydrate restricted/protein rich diet. Patient is encouraged to switch to  unprocessed or minimally processed     complex starch and increased protein intake (animal or plant source), fruits, and vegetables. -  she is advised to stick to a routine mealtimes to eat 3 meals  a day and avoid unnecessary snacks ( to snack only to correct hypoglycemia).   - she acknowledges that there is a room for improvement in her food and drink choices. - Suggestion is made for her to avoid simple carbohydrates  from her diet including Cakes, Sweet Desserts, Ice Cream, Soda (diet and regular), Sweet Tea, Candies, Chips, Cookies, Store Bought  Juices, Alcohol , Artificial Sweeteners,  Coffee Creamer, and "Sugar-free" Products, Lemonade. This will help patient to have more stable blood glucose profile and potentially avoid unintended weight gain.  The following Lifestyle Medicine recommendations according to American College of Lifestyle Medicine  [facility]) were discussed and and offered to patient and she  agrees to start the journey:  A. Whole Foods, Plant-Based Nutrition comprising of fruits and vegetables, plant-based proteins, whole-grain carbohydrates was discussed in detail with the patient.   A list for source of those nutrients were also provided to the patient.  Patient will use only water or unsweetened tea for hydration. B.  The need to stay away from risky substances including alcohol, smoking; obtaining 7 to 9 hours of restorative sleep, at least 150 minutes of moderate intensity exercise weekly, the importance of healthy social connections,  and stress management techniques were discussed. C.  A full color page of  Calorie density of various food groups per pound showing examples of each food groups was provided to the patient.    - she has been scheduled with Norm Salt, RDN, CDE for diabetes education.  - I have approached her with the following individualized plan to manage  her diabetes and patient agrees:   She will need an additional intervention based on her presentation with A1c of 8.3%.  She did not tolerate Rybelsus in the past.  She is advised to continue metformin 500 mg p.o.  twice daily.  She may benefit from an SGLT2 inhibitors.  I discussed and added Farxiga 5 mg p.o. daily at breakfast.  Side effects and precautions discussed with her.  She is advised to continue  to monitor blood glucose at least once a day-daily before breakfast.    - she is encouraged to call clinic for blood glucose levels less than 70 or above 200 mg per DL at fasting .   - Specific targets for  A1c;  LDL, HDL,  and Triglycerides  were discussed with the patient.  2) Blood Pressure /Hypertension: -Her blood pressure is controlled to target.  she is advised to continue her current medications including amlodipine 10 mg p.o. daily, losartan 100 mg p.o. daily with breakfast.     3) Lipids/Hyperlipidemia:   Review of her recent lipid panel showed  controlled  LDL at 63 .  she  is advised to continue simvastatin 20 mg p.o. daily at bedtime.  Side effects and precautions discussed with her.    4)  Weight/Diet:  Body mass index is 34.19 kg/m.  -She presents with progressive weight gain.     she is  a candidate for more weight loss. I discussed with her the fact that loss of 5 - 10% of her  current body weight will have the most impact on her diabetes management.  Exercise, and detailed carbohydrates information provided  -  detailed on discharge instructions.  5) Chronic Care/Health Maintenance:  -she   on ACEI/ARB and Statin medications and  is encouraged to initiate and continue to follow up with Ophthalmology, Dentist,  Podiatrist at least yearly or according to recommendations, and advised to   stay away from smoking. I have recommended yearly flu vaccine and pneumonia vaccine at least every 5 years; moderate intensity exercise for up to 150 minutes weekly; and  sleep for at least 7 hours a day.  - she is  advised to maintain close follow up with Benetta Spar, MD for primary care needs, as well as her other providers for optimal and coordinated care.   I spent  26  minutes in the care of the patient today including review of labs from CMP, Lipids, Thyroid Function, Hematology (current and previous including abstractions from other facilities); face-to-face time discussing  her blood glucose readings/logs, discussing hypoglycemia and hyperglycemia episodes and symptoms, medications doses, her options of short and long term treatment based on the latest standards of care / guidelines;  discussion about incorporating  lifestyle medicine;  and documenting the encounter. Risk reduction counseling performed per USPSTF guidelines to reduce  obesity and cardiovascular risk factors.     Please refer to Patient Instructions for Blood Glucose Monitoring and Insulin/Medications Dosing Guide"  in media tab for additional information. Please  also refer to " Patient Self Inventory" in the Media  tab for reviewed elements of pertinent patient history.  Placido Sou participated in the discussions, expressed understanding, and voiced agreement with the above plans.  All questions were answered to her satisfaction. she is encouraged to contact clinic should she have any questions or concerns prior to her return visit.  Follow up plan: - Return in about 3 months (around 03/18/2023) for Bring Meter/CGM Device/Logs- A1c in Office.  Marquis Lunch, MD [facility] [address] [place], [place] [address] Phone: [phone]  Fax: [phone]    12/17/2022, 6:28 PM  This note was partially dictated with voice recognition software. Similar sounding words can  be transcribed inadequately or may not  be corrected upon review.

## 2023-01-05 DIAGNOSIS — E1165 Type 2 diabetes mellitus with hyperglycemia: Secondary | ICD-10-CM | POA: Diagnosis not present

## 2023-01-05 DIAGNOSIS — I10 Essential (primary) hypertension: Secondary | ICD-10-CM | POA: Diagnosis not present

## 2023-01-26 ENCOUNTER — Other Ambulatory Visit: Payer: Self-pay

## 2023-01-26 ENCOUNTER — Encounter (HOSPITAL_COMMUNITY): Payer: Self-pay | Admitting: Emergency Medicine

## 2023-01-26 ENCOUNTER — Emergency Department (HOSPITAL_COMMUNITY): Payer: Medicare Other

## 2023-01-26 ENCOUNTER — Emergency Department (HOSPITAL_COMMUNITY)
Admission: EM | Admit: 2023-01-26 | Discharge: 2023-01-26 | Disposition: A | Discharge status: Home / Self Care | Payer: Medicare Other | Attending: Emergency Medicine | Admitting: Emergency Medicine

## 2023-01-26 DIAGNOSIS — R059 Cough, unspecified: Secondary | ICD-10-CM | POA: Diagnosis not present

## 2023-01-26 DIAGNOSIS — J45901 Unspecified asthma with (acute) exacerbation: Secondary | ICD-10-CM | POA: Insufficient documentation

## 2023-01-26 DIAGNOSIS — J40 Bronchitis, not specified as acute or chronic: Secondary | ICD-10-CM

## 2023-01-26 DIAGNOSIS — Z1152 Encounter for screening for COVID-19: Secondary | ICD-10-CM | POA: Insufficient documentation

## 2023-01-26 DIAGNOSIS — R0602 Shortness of breath: Secondary | ICD-10-CM | POA: Diagnosis not present

## 2023-01-26 DIAGNOSIS — R5381 Other malaise: Secondary | ICD-10-CM | POA: Diagnosis not present

## 2023-01-26 DIAGNOSIS — R079 Chest pain, unspecified: Secondary | ICD-10-CM | POA: Diagnosis not present

## 2023-01-26 LAB — BASIC METABOLIC PANEL
Anion gap: 16 — ABNORMAL HIGH (ref 5–15)
BUN: 17 mg/dL (ref 8–23)
CO2: 23 mmol/L (ref 22–32)
Calcium: 9.2 mg/dL (ref 8.9–10.3)
Chloride: 99 mmol/L (ref 98–111)
Creatinine, Ser: 0.97 mg/dL (ref 0.44–1.00)
GFR, Estimated: >60 mL/min (ref >60)
Glucose, Bld: 125 mg/dL — ABNORMAL HIGH (ref 70–99)
Potassium: 3.6 mmol/L (ref 3.5–5.1)
Sodium: 138 mmol/L (ref 135–145)

## 2023-01-26 LAB — CBC
HCT: 39.1 % (ref 36.0–46.0)
Hemoglobin: 12.2 g/dL (ref 12.0–15.0)
MCH: 25.3 pg — ABNORMAL LOW (ref 26.0–34.0)
MCHC: 31.2 g/dL (ref 30.0–36.0)
MCV: 81.1 fL (ref 80.0–100.0)
Platelets: 309 10*3/uL (ref 150–400)
RBC: 4.82 MIL/uL (ref 3.87–5.11)
RDW: 14 % (ref 11.5–15.5)
WBC: 5 10*3/uL (ref 4.0–10.5)
nRBC: 0 % (ref 0.0–0.2)

## 2023-01-26 LAB — TROPONIN I (HIGH SENSITIVITY)
Troponin I (High Sensitivity): 2 ng/L (ref <18)
Troponin I (High Sensitivity): 2 ng/L (ref <18)

## 2023-01-26 LAB — SARS CORONAVIRUS 2 BY RT PCR: SARS Coronavirus 2 by RT PCR: NEGATIVE

## 2023-01-26 MED ORDER — IPRATROPIUM-ALBUTEROL 0.5-2.5 (3) MG/3ML IN SOLN
3.0000 mL | Freq: Once | RESPIRATORY_TRACT | Status: AC
  Administered 2023-01-26: 3 mL via RESPIRATORY_TRACT
  Filled 2023-01-26: qty 3

## 2023-01-26 MED ORDER — DOXYCYCLINE HYCLATE 100 MG PO CAPS: 100.0000 mg | ORAL_CAPSULE | Freq: Two times a day (BID) | ORAL | 0 refills | Status: AC

## 2023-01-26 MED ORDER — ALBUTEROL SULFATE HFA 108 (90 BASE) MCG/ACT IN AERS: 2.0000 | INHALATION_SPRAY | Freq: Four times a day (QID) | RESPIRATORY_TRACT | 2 refills | Status: AC | PRN

## 2023-01-26 MED ORDER — PREDNISONE 50 MG PO TABS: ORAL_TABLET | ORAL | 0 refills | Status: AC

## 2023-01-26 MED ORDER — PREDNISONE 50 MG PO TABS
60.0000 mg | ORAL_TABLET | Freq: Every day | ORAL | Status: DC
  Administered 2023-01-26: 60 mg via ORAL
  Filled 2023-01-26: qty 1

## 2023-01-26 NOTE — ED Provider Notes (Signed)
[place] EMERGENCY DEPARTMENT AT [facility] Provider Note   CSN: [id number] Arrival date & time: 01/26/23  1152     History  Chief Complaint  Patient presents with   Cough    Mary Robinson is a 67 y.o. female.  Patient complains of a cough and congestion for the past week.  Patient reports that she has a history of asthma.  Patient reports her husband recently had bronchitis.  Patient complains of wheezing and current shortness of breath  The history is provided by the patient. No language interpreter was used.  Cough Cough characteristics:  Non-productive Sputum characteristics:  Nondescript Severity:  Mild Onset quality:  Gradual Duration:  1 week Timing:  Constant Progression:  Worsening Chronicity:  New Context: occupational exposure   Relieved by:  Nothing Worsened by:  Nothing Ineffective treatments:  Beta-agonist inhaler Associated symptoms: no chills        Home Medications Prior to Admission medications   Medication Sig Start Date End Date Taking? Authorizing Provider  albuterol (VENTOLIN HFA) 108 (90 Base) MCG/ACT inhaler Inhale 2 puffs into the lungs every 6 (six) hours as needed for wheezing or shortness of breath. 01/26/23  Yes Cheron Schaumann K, PA-C  doxycycline (VIBRAMYCIN) 100 MG capsule Take 1 capsule (100 mg total) by mouth 2 (two) times daily. 01/26/23  Yes Cheron Schaumann K, PA-C  predniSONE (DELTASONE) 50 MG tablet One tablet a day for 5 days 01/26/23  Yes Elson Areas, PA-C  acetaminophen (TYLENOL) 500 MG tablet Take 500-1,000 mg by mouth every 6 (six) hours as needed for moderate pain.     [provider]  amLODipine (NORVASC) 10 MG tablet Take 10 mg by mouth daily with breakfast.     [provider]  aspirin 81 MG tablet Take 81 mg by mouth daily.      [provider]  Blood Glucose Monitoring Suppl (ACCU-CHEK GUIDE ME) w/Device KIT 1 Piece by Does not apply route as directed. 01/08/22   Roma Kayser, MD  glucose blood (ACCU-CHEK GUIDE) test strip Test glucose 2 times daily. 01/08/22   Roma Kayser, MD  hydrochlorothiazide (HYDRODIURIL) 25 MG tablet Take 25 mg by mouth daily. 09/28/19   [provider]  loratadine (CLARITIN) 10 MG tablet Take 10 mg by mouth daily.      [provider]  magnesium oxide (MAG-OX) 400 MG tablet Take 400 mg by mouth daily.  10/25/19   [provider]  metFORMIN (GLUCOPHAGE) 500 MG tablet Take 2 tablets (1,000 mg total) by mouth 2 (two) times daily with a meal. 12/17/22   Nida, Denman George, MD  omeprazole (PRILOSEC) 20 MG capsule Take 20 mg by mouth daily with breakfast.  03/23/13   [provider]  polyethylene glycol (MIRALAX / GLYCOLAX) packet Take 17 g by mouth daily as needed (for constipation). 09/28/14   Tiffany Kocher, PA-C  simvastatin (ZOCOR) 20 MG tablet Take 20 mg by mouth daily. 09/01/19   [provider]  spironolactone (ALDACTONE) 25 MG tablet Take 50 mg by mouth once. Patient not taking: Reported on 11/11/2022 08/18/19   [provider]  Vitamin D, Ergocalciferol, (DRISDOL) 1.25 MG (50000 UNIT) CAPS capsule Take 50,000 Units by mouth every 30 (thirty) days.    [provider]      Allergies    Patient has no known allergies.    Review of Systems   Review of Systems  Constitutional:  Negative for chills.  Respiratory:  Positive for cough.   All other systems reviewed and are negative.   Physical Exam Updated Vital Signs BP 112/63   Pulse 77   Temp 98.1 F (36.7 C)   Resp (!) 22   Ht 5\' 4"  (1.626 m)   Wt 91.2 kg   SpO2 95%   BMI 34.50 kg/m  Physical Exam Vitals and nursing note reviewed.  Constitutional:      Appearance: She is well-developed.  HENT:     Head: Normocephalic.     Mouth/Throat:     Mouth: Mucous membranes are moist.  Eyes:     Pupils: Pupils are equal, round, and reactive to light.  Cardiovascular:     Rate and Rhythm: Normal rate.   Pulmonary:     Effort: Pulmonary effort is normal.  Abdominal:     General: There is no distension.  Musculoskeletal:        General: Normal range of motion.     Cervical back: Normal range of motion.  Neurological:     General: No focal deficit present.     Mental Status: She is alert and oriented to person, place, and time.  Psychiatric:        Mood and Affect: Mood normal.     ED Results / Procedures / Treatments   Labs (all labs ordered are listed, but only abnormal results are displayed) Labs Reviewed  BASIC METABOLIC PANEL - Abnormal; Notable for the following components:      Result Value   Glucose, Bld 125 (*)    Anion gap 16 (*)    All other components within normal limits  CBC - Abnormal; Notable for the following components:   MCH 25.3 (*)    All other components within normal limits  SARS CORONAVIRUS 2 BY RT PCR  TROPONIN I (HIGH SENSITIVITY)  TROPONIN I (HIGH SENSITIVITY)    EKG EKG Interpretation  Date/Time:  Sunday Jan 26 2023 12:43:21 EDT Ventricular Rate:  81 PR Interval:  154 QRS Duration: 84 QT Interval:  386 QTC Calculation: 448 R Axis:   60 Text Interpretation: Normal sinus rhythm Normal ECG When compared with ECG of 11-Aug-2022 12:25, No significant change since last tracing Confirmed by Meridee Score [phone]) on 01/26/2023 12:50:30 PM  Radiology DG Chest Port 1 View  Result Date: 01/26/2023 CLINICAL DATA:  Shortness of breath, cough, lung pain and malaise. EXAM: PORTABLE CHEST 1 VIEW COMPARISON:  08/11/2022 FINDINGS: Normal heart size. No pleural effusion, interstitial edema or airspace consolidation. Chronic coarsened interstitial markings appear similar to previous exam. Visualized osseous structures are unremarkable. IMPRESSION: No acute cardiopulmonary abnormalities. Electronically Signed   By: Signa Kell M.D.   On: 01/26/2023 14:38    Procedures Procedures    Medications Ordered in ED Medications  ipratropium-albuterol (DUONEB)  0.5-2.5 (3) MG/3ML nebulizer solution 3 mL (3 mLs Nebulization Given 01/26/23 1400)    ED Course/ Medical Decision Making/ A&P                             Medical Decision Making Complains of cough congestion and wheezing.  Amount and/or Complexity of Data Reviewed Labs: ordered. Decision-making details documented in ED Course.    Details: Labs ordered reviewed and interpreted.  Troponin is negative COVID is negative Radiology: ordered and independent interpretation performed. Decision-making details documented in ED Course.    Details: X-ray shows no evidence of pneumonia ECG/medicine tests: ordered and independent interpretation performed. Decision-making details documented  in ED Course.    Details: KG no acute changes  Risk Prescription drug management. Risk Details: Given an albuterol nebulization.  Patient ports she feels much better.  Patient is given a prescription for albuterol inhaler I will treat her with doxycycline and prednisone.  Patient is advised to follow-up with her primary care physician for recheck           Final Clinical Impression(s) / ED Diagnoses Final diagnoses:  Moderate asthma with exacerbation, unspecified whether persistent  Bronchitis    Rx / DC Orders ED Discharge Orders          Ordered    albuterol (VENTOLIN HFA) 108 (90 Base) MCG/ACT inhaler  Every 6 hours PRN        01/26/23 1621    doxycycline (VIBRAMYCIN) 100 MG capsule  2 times daily        01/26/23 1621    predniSONE (DELTASONE) 50 MG tablet        01/26/23 1621           An After Visit Summary was printed and given to the patient.    Osie Cheeks 01/26/23 2232    Derwood Kaplan, MD 01/31/23 [phone]

## 2023-01-26 NOTE — ED Provider Triage Note (Signed)
Emergency Medicine Provider Triage Evaluation Note  HALAH JAREMA , a 67 y.o. female  was evaluated in triage.  Pt complains of shortness of breath and cough for the past week.  Patient has no history of asthma and has been using her albuterol inhaler more often than usual due to wheezing.  Patient denies any fevers.  Patient has tried cough medicine over-the-counter to no relief.  Patient denies any recent surgeries, travel, hospitalizations, leg swelling, estrogen use, blood clots.  Review of Systems  Positive: See HPI Negative: See HPI  Physical Exam  BP (!) 155/93 (BP Location: Right Arm)   Pulse 81   Temp 99 F (37.2 C) (Oral)   Resp 18   Ht 5\' 4"  (1.626 m)   Wt 91.2 kg   SpO2 95%   BMI 34.50 kg/m  Gen:   Awake, no distress   Resp:  Normal effort , wheezing MSK:   Moves extremities without difficulty  Other:    Medical Decision Making  Medically screening exam initiated at 12:56 PM.  Appropriate orders placed.  LEMON SUPPA was informed that the remainder of the evaluation will be completed by another provider, this initial triage assessment does not replace that evaluation, and the importance of remaining in the ED until their evaluation is complete.  Workup initiated, wheezing noted on exam and so DuoNeb treatments ordered.  Patient stable at this time.   Netta Corrigan, PA-C 01/26/23 1305

## 2023-01-26 NOTE — Discharge Instructions (Addendum)
See your Physician for recheck this week.   °

## 2023-01-26 NOTE — ED Triage Notes (Signed)
Pt via POV c/o cough, lung pain, and general malaise x 1 week after a family member was recently treated for bronchitis. Pt also reports mid-chest pain worse with cough since earlier this morning. Pain 4/10, NAD in triage.

## 2023-01-30 ENCOUNTER — Telehealth: Payer: Self-pay

## 2023-01-30 DIAGNOSIS — I10 Essential (primary) hypertension: Secondary | ICD-10-CM | POA: Diagnosis not present

## 2023-01-30 DIAGNOSIS — E1165 Type 2 diabetes mellitus with hyperglycemia: Secondary | ICD-10-CM | POA: Diagnosis not present

## 2023-01-30 DIAGNOSIS — J4541 Moderate persistent asthma with (acute) exacerbation: Secondary | ICD-10-CM | POA: Diagnosis not present

## 2023-01-30 NOTE — Telephone Encounter (Signed)
Transition Care Management Follow-up Telephone Call Date of discharge and from where: [facility] How have you been since you were released from the hospital? Not well  Any questions or concerns? No  Items Reviewed: Did the pt receive and understand the discharge instructions provided? Yes  Medications obtained and verified? Yes  Other? No  Any new allergies since your discharge? Yes  Dietary orders reviewed? No Do you have support at home? Yes     Follow up appointments reviewed:  PCP Hospital f/u appt confirmed? Yes  Scheduled to see PCP on 5/30 @ . Specialist Hospital f/u appt confirmed? No  Scheduled to see  on  @ . Are transportation arrangements needed? No  If their condition worsens, is the pt aware to call PCP or go to the Emergency Dept.? Yes Was the patient provided with contact information for the PCP's office or ED? Yes Was to pt encouraged to call back with questions or concerns? Yes

## 2023-01-31 ENCOUNTER — Other Ambulatory Visit: Payer: Self-pay | Admitting: "Endocrinology

## 2023-02-19 ENCOUNTER — Ambulatory Visit: Payer: Medicare Other | Admitting: "Endocrinology

## 2023-03-02 DIAGNOSIS — I10 Essential (primary) hypertension: Secondary | ICD-10-CM | POA: Diagnosis not present

## 2023-03-02 DIAGNOSIS — E1165 Type 2 diabetes mellitus with hyperglycemia: Secondary | ICD-10-CM | POA: Diagnosis not present

## 2023-03-16 ENCOUNTER — Other Ambulatory Visit: Payer: Self-pay | Admitting: "Endocrinology

## 2023-03-18 ENCOUNTER — Ambulatory Visit: Payer: Medicare Other | Admitting: "Endocrinology

## 2023-03-18 ENCOUNTER — Encounter: Payer: Self-pay | Admitting: "Endocrinology

## 2023-03-18 VITALS — BP 126/78 | HR 80 | Ht 64.0 in | Wt 198.8 lb

## 2023-03-18 DIAGNOSIS — E782 Mixed hyperlipidemia: Secondary | ICD-10-CM | POA: Diagnosis not present

## 2023-03-18 DIAGNOSIS — Z7984 Long term (current) use of oral hypoglycemic drugs: Secondary | ICD-10-CM | POA: Diagnosis not present

## 2023-03-18 DIAGNOSIS — E6609 Other obesity due to excess calories: Secondary | ICD-10-CM

## 2023-03-18 DIAGNOSIS — E1165 Type 2 diabetes mellitus with hyperglycemia: Secondary | ICD-10-CM | POA: Diagnosis not present

## 2023-03-18 DIAGNOSIS — I10 Essential (primary) hypertension: Secondary | ICD-10-CM

## 2023-03-18 DIAGNOSIS — Z6834 Body mass index (BMI) 34.0-34.9, adult: Secondary | ICD-10-CM

## 2023-03-18 LAB — POCT GLYCOSYLATED HEMOGLOBIN (HGB A1C): HbA1c, POC (controlled diabetic range): 7.7 % — AB (ref 0.0–7.0)

## 2023-03-18 MED ORDER — METFORMIN HCL 500 MG PO TABS: 1000.0000 mg | ORAL_TABLET | Freq: Two times a day (BID) | ORAL | 0 refills | Status: DC

## 2023-03-18 NOTE — Patient Instructions (Signed)
                                      Advice for Weight Management  -For most of us the best way to lose weight is by diet management. Generally speaking, diet management means consuming less calories intentionally which over time brings about progressive weight loss.  This can be achieved more effectively by avoiding ultra processed carbohydrates, processed meats, unhealthy fats.    It is critically important to know your numbers: how much calorie you are consuming and how much calorie you need. More importantly, our carbohydrates sources should be unprocessed naturally occurring  complex starch food items.  It is always important to balance nutrition also by  appropriate intake of proteins (mainly plant-based), healthy fats/oils, plenty of fruits and vegetables.   -The American College of Lifestyle Medicine (ACL M) recommends nutrition derived mostly from Whole Food, Plant Predominant Sources example an apple instead of applesauce or apple pie. Eat Plenty of vegetables, Mushrooms, fruits, Legumes, Whole Grains, Nuts, seeds in lieu of processed meats, processed snacks/pastries red meat, poultry, eggs.  Use only water or unsweetened tea for hydration.  The College also recommends the need to stay away from risky substances including alcohol, smoking; obtaining 7-9 hours of restorative sleep, at least 150 minutes of moderate intensity exercise weekly, importance of healthy social connections, and being mindful of stress and seek help when it is overwhelming.    -Sticking to a routine mealtime to eat 3 meals a day and avoiding unnecessary snacks is shown to have a big role in weight control. Under normal circumstances, the only time we burn stored energy is when we are hungry, so allow  some hunger to take place- hunger means no food between appropriate meal times, only water.  It is not advisable to starve.   -It is better to avoid simple carbohydrates including:  Cakes, Sweet Desserts, Ice Cream, Soda (diet and regular), Sweet Tea, Candies, Chips, Cookies, Store Bought Juices, Alcohol in Excess of  1-2 drinks a day, Lemonade,  Artificial Sweeteners, Doughnuts, Coffee Creamers, "Sugar-free" Products, etc, etc.  This is not a complete list.....    -Consulting with certified diabetes educators is proven to provide you with the most accurate and current information on diet.  Also, you may be  interested in discussing diet options/exchanges , we can schedule a visit with Mary Robinson, RDN, CDE for individualized nutrition education.  -Exercise: If you are able: 30 -60 minutes a day ,4 days a week, or 150 minutes of moderate intensity exercise weekly.    The longer the better if tolerated.  Combine stretch, strength, and aerobic activities.  If you were told in the past that you have high risk for cardiovascular diseases, or if you are currently symptomatic, you may seek evaluation by your heart doctor prior to initiating moderate to intense exercise programs.                                  Additional Care Considerations for Diabetes/Prediabetes   -Diabetes  is a chronic disease.  The most important care consideration is regular follow-up with your diabetes care provider with the goal being avoiding or delaying its complications and to take advantage of advances in medications and technology.  If appropriate actions are taken early enough, type 2 diabetes can even be   reversed.  Seek information from the right source.  - Whole Food, Plant Predominant Nutrition is highly recommended: Eat Plenty of vegetables, Mushrooms, fruits, Legumes, Whole Grains, Nuts, seeds in lieu of processed meats, processed snacks/pastries red meat, poultry, eggs as recommended by American College of  Lifestyle Medicine (ACLM).  -Type 2 diabetes is known to coexist with other important comorbidities such as high blood pressure and high cholesterol.  It is critical to control not only the  diabetes but also the high blood pressure and high cholesterol to minimize and delay the risk of complications including coronary artery disease, stroke, amputations, blindness, etc.  The good news is that this diet recommendation for type 2 diabetes is also very helpful for managing high cholesterol and high blood blood pressure.  - Studies showed that people with diabetes will benefit from a class of medications known as ACE inhibitors and statins.  Unless there are specific reasons not to be on these medications, the standard of care is to consider getting one from these groups of medications at an optimal doses.  These medications are generally considered safe and proven to help protect the heart and the kidneys.    - People with diabetes are encouraged to initiate and maintain regular follow-up with eye doctors, foot doctors, dentists , and if necessary heart and kidney doctors.     - It is highly recommended that people with diabetes quit smoking or stay away from smoking, and get yearly  flu vaccine and pneumonia vaccine at least every 5 years.  See above for additional recommendations on exercise, sleep, stress management , and healthy social connections.      

## 2023-03-18 NOTE — Progress Notes (Signed)
03/18/2023, 8:29 PM   Endocrinology follow-up note  Subjective:    Patient ID: Mary Robinson, female    DOB: [birth year].  Mary Robinson is being seen in follow-up after she was seen in consultation for management of currently uncontrolled symptomatic diabetes requested by  Benetta Spar, MD.   Past Medical History:  Diagnosis Date   Anemia    Anxiety    Arthritis    Asthma    Bursitis    Chronic back pain    Chronic constipation    Depression    Diastasis recti    Essential hypertension    GERD (gastroesophageal reflux disease)    Helicobacter pylori gastritis    Treated in May 2011   Hyperlipidemia    Obesity    S/P colonoscopy 12/2009   Multiple tubular adenomas: next TCS May 2014   S/P endoscopy 12/2009   Severe erosive reflux esophagitis, noncritial schatzki's ring, +h.pylori   Seasonal allergies    Sleep apnea    Type II diabetes mellitus (HCC)    Umbilical hernia     Past Surgical History:  Procedure Laterality Date   CHOLECYSTECTOMY  1999   COLONOSCOPY  May 2011   Dr. Jena Gauss: long, torturous colon, multiple colonic polyps. TUBULAR ADENOMAS   COLONOSCOPY N/A 04/15/2013   normal rectum, torturous colon.  tubular adenomas. Surveillance due 2019.    DILATION AND CURETTAGE OF UTERUS  1970's   ESOPHAGOGASTRODUODENOSCOPY  May 2011   Dr. Jena Gauss: severe erosive reflux esophagitis, non-critical Schatzki's ring, diffuse gastric erosions. H. PYLORI GASTRITIS   HERNIA REPAIR  05/04/2013   laparoscopic incisional hernia repair w/mesh per notes(05/04/2013)   INCISIONAL HERNIA REPAIR N/A 05/04/2013   Procedure: LAPAROSCOPIC INCISIONAL HERNIA ;  Surgeon: Ernestene Mention, MD;  Location: [facility] OR;  Service: General;  Laterality: N/A;   INSERTION OF MESH N/A 05/04/2013   Procedure: INSERTION OF MESH;  Surgeon: Ernestene Mention, MD;  Location: [facility] OR;  Service: General;  Laterality: N/A;    TUBAL LIGATION  1970's    Social History   Socioeconomic History   Marital status: Single    Spouse name: Not on file   Number of children: Not on file   Years of education: Not on file   Highest education level: Not on file  Occupational History   Occupation: retired  Tobacco Use   Smoking status: Never   Smokeless tobacco: Never  Vaping Use   Vaping status: Never Used  Substance and Sexual Activity   Alcohol use: No   Drug use: No   Sexual activity: Never    Birth control/protection: Post-menopausal  Other Topics Concern   Not on file  Social History Narrative   Not on file   Social Determinants of Health   Financial Resource Strain: Not on file  Food Insecurity: Not on file  Transportation Needs: Not on file  Physical Activity: Not on file  Stress: Not on file  Social Connections: Unknown (01/15/2022)   Received from [facility]   Social Network    Social Network: Not on file    Family History  Problem Relation Age  of Onset   Colon cancer Maternal Uncle    Colon cancer Paternal Uncle    COPD Mother    Hypertension Mother    Seizures Mother    Chronic Renal Failure Mother    Hyperlipidemia Mother    Stroke Mother    Kidney disease Mother    Heart failure Mother    Heart disease Father    Diabetes Sister    Hypertension Sister    Heart disease Sister    Diabetes Brother    Hypertension Brother    Hypertension Daughter    Hypertension Son    Diabetes Sister    Hypertension Sister    Diabetes Sister    Hypertension Sister     Outpatient Encounter Medications as of 03/18/2023  Medication Sig   ACCU-CHEK GUIDE test strip TEST GLUCOSE 2 TIMES DAILY.   acetaminophen (TYLENOL) 500 MG tablet Take 500-1,000 mg by mouth every 6 (six) hours as needed for moderate pain.    albuterol (VENTOLIN HFA) 108 (90 Base) MCG/ACT inhaler Inhale 2 puffs into the lungs every 6 (six) hours as needed for wheezing or shortness of breath.   amLODipine (NORVASC) 10 MG  tablet Take 10 mg by mouth daily with breakfast.    aspirin 81 MG tablet Take 81 mg by mouth daily.     Blood Glucose Monitoring Suppl (ACCU-CHEK GUIDE ME) w/Device KIT 1 Piece by Does not apply route as directed.   doxycycline (VIBRAMYCIN) 100 MG capsule Take 1 capsule (100 mg total) by mouth 2 (two) times daily.   hydrochlorothiazide (HYDRODIURIL) 25 MG tablet Take 25 mg by mouth daily.   loratadine (CLARITIN) 10 MG tablet Take 10 mg by mouth daily.     magnesium oxide (MAG-OX) 400 MG tablet Take 400 mg by mouth daily.    metFORMIN (GLUCOPHAGE) 500 MG tablet Take 2 tablets (1,000 mg total) by mouth 2 (two) times daily with a meal.   omeprazole (PRILOSEC) 20 MG capsule Take 20 mg by mouth daily with breakfast.    polyethylene glycol (MIRALAX / GLYCOLAX) packet Take 17 g by mouth daily as needed (for constipation).   predniSONE (DELTASONE) 50 MG tablet One tablet a day for 5 days   simvastatin (ZOCOR) 20 MG tablet Take 20 mg by mouth daily.   spironolactone (ALDACTONE) 25 MG tablet Take 50 mg by mouth once. (Patient not taking: Reported on 11/11/2022)   Vitamin D, Ergocalciferol, (DRISDOL) 1.25 MG (50000 UNIT) CAPS capsule Take 50,000 Units by mouth every 30 (thirty) days.   [DISCONTINUED] metFORMIN (GLUCOPHAGE) 500 MG tablet TAKE 2 TABLETS (1,000 MG TOTAL) BY MOUTH 2 (TWO) TIMES DAILY WITH A MEAL. (Patient taking differently: Take 1-2 tablets by mouth 2 (two) times daily with a meal.)   No facility-administered encounter medications on file as of 03/18/2023.    ALLERGIES: No Known Allergies  VACCINATION STATUS: Immunization History  Administered Date(s) Administered   Influenza-Unspecified 06/29/2014, 07/03/2018    Diabetes She presents for Mary Robinson follow-up diabetic visit. She has type 2 diabetes mellitus. Mary Robinson disease course has been improving. There are no hypoglycemic associated symptoms. Pertinent negatives for hypoglycemia include no confusion, headaches, pallor or seizures. Pertinent  negatives for diabetes include no chest pain, no fatigue, no polydipsia, no polyphagia and no polyuria. There are no hypoglycemic complications. Symptoms are worsening. Risk factors for coronary artery disease include dyslipidemia, diabetes mellitus, family history, obesity, hypertension, sedentary lifestyle and post-menopausal. Current diabetic treatments: She is currently on Metformin 1000 mg p.o. twice daily, Tradjenta 5  mg p.o. daily. Mary Robinson weight is fluctuating minimally. She is following a generally unhealthy diet. When asked about meal planning, she reported none. She has not had a previous visit with a dietitian. She rarely participates in exercise. Mary Robinson home blood glucose trend is decreasing steadily. Mary Robinson breakfast blood glucose range is generally 140-180 mg/dl. Mary Robinson bedtime blood glucose range is generally 180-200 mg/dl. Mary Robinson overall blood glucose range is 180-200 mg/dl. Mary Robinson presents with Mary Robinson meter and logs showing improving but still slightly above target glycemic profile.  Mary Robinson point-of-care A1c is 7.7%, improving from 8.3% during Mary Robinson last visit.  She did not tolerate Marcelline Deist, Jardiance, stays on metformin [phone] mg p.o. daily.    ) An ACE inhibitor/angiotensin II receptor blocker is being taken. She does not see a podiatrist.Eye exam is current.  Hyperlipidemia This is a chronic problem. The current episode started more than 1 year ago. The problem is uncontrolled. Exacerbating diseases include diabetes and obesity. Pertinent negatives include no chest pain, myalgias or shortness of breath. Current antihyperlipidemic treatment includes statins. Risk factors for coronary artery disease include diabetes mellitus, dyslipidemia, hypertension, a sedentary lifestyle, post-menopausal, obesity and family history.  Hypertension This is a chronic problem. The current episode started more than 1 year ago. The problem is controlled. Pertinent negatives include no chest pain, headaches, palpitations or  shortness of breath. Risk factors for coronary artery disease include dyslipidemia, diabetes mellitus, obesity, sedentary lifestyle, family history and post-menopausal state. Past treatments include angiotensin blockers and calcium channel blockers.     Review of Systems  Constitutional:  Negative for chills, fatigue, fever and unexpected weight change.  HENT:  Negative for trouble swallowing and voice change.   Eyes:  Negative for visual disturbance.  Respiratory:  Negative for cough, shortness of breath and wheezing.   Cardiovascular:  Negative for chest pain, palpitations and leg swelling.  Gastrointestinal:  Negative for diarrhea, nausea and vomiting.  Endocrine: Negative for cold intolerance, heat intolerance, polydipsia, polyphagia and polyuria.  Musculoskeletal:  Negative for arthralgias and myalgias.  Skin:  Negative for color change, pallor, rash and wound.  Neurological:  Negative for seizures and headaches.  Psychiatric/Behavioral:  Negative for confusion and suicidal ideas.     Objective:       03/18/2023    3:22 PM 01/26/2023    4:30 PM 01/26/2023    3:30 PM  Vitals with BMI  Height 5\' 4"     Weight 198 lbs 13 oz    BMI 34.11    Systolic 126 112 742  Diastolic 78 63 78  Pulse 80 77 80    BP 126/78   Pulse 80   Ht 5\' 4"  (1.626 m)   Wt 198 lb 12.8 oz (90.2 kg)   BMI 34.12 kg/m   Wt Readings from Last 3 Encounters:  03/18/23 198 lb 12.8 oz (90.2 kg)  01/26/23 201 lb (91.2 kg)  12/17/22 199 lb 3.2 oz (90.4 kg)      CMP ( most recent) CMP     Component Value Date/Time   NA 138 01/26/2023 1253   NA 143 11/05/2022 1344   K 3.6 01/26/2023 1253   CL 99 01/26/2023 1253   CO2 23 01/26/2023 1253   GLUCOSE 125 (H) 01/26/2023 1253   BUN 17 01/26/2023 1253   BUN 16 11/05/2022 1344   CREATININE 0.97 01/26/2023 1253   CREATININE 1.05 (H) 11/09/2019 1020   CALCIUM 9.2 01/26/2023 1253   PROT 7.2 11/05/2022 1344   ALBUMIN 4.3 11/05/2022 1344  AST 22 11/05/2022  1344   ALT 25 11/05/2022 1344   ALKPHOS 68 11/05/2022 1344   BILITOT <0.2 11/05/2022 1344   GFRNONAA >60 01/26/2023 1253   GFRNONAA 55 (L) 11/09/2019 1020   GFRAA 69 10/10/2020 0000   GFRAA 64 11/09/2019 1020     Diabetic Labs (most recent): Lab Results  Component Value Date   HGBA1C 7.7 (A) 03/18/2023   HGBA1C 8.3 (A) 11/11/2022   HGBA1C 6.9 05/13/2022     Lipid Panel ( most recent) Lipid Panel     Component Value Date/Time   CHOL 141 05/08/2022 0904   TRIG 108 05/08/2022 0904   HDL 54 05/08/2022 0904   CHOLHDL 2.6 05/08/2022 0904   LDLCALC 67 05/08/2022 0904   LABVLDL 20 05/08/2022 0904      Assessment & Plan:   1. 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  Mary Robinson presents with Mary Robinson meter and logs showing improving but still slightly above target glycemic profile.  Mary Robinson point-of-care A1c is 7.7%, improving from 8.3% during Mary Robinson last visit.  She did not tolerate Marcelline Deist, Jardiance, stays on metformin [phone] mg p.o. daily.    -Recent labs reviewed. - I had a long discussion with Mary Robinson about the progressive nature of diabetes and the pathology behind its complications. -Mary Robinson diabetes is complicated by obesity/sedentary life and she remains at a high risk for more acute and chronic complications which include CAD, CVA, CKD, retinopathy, and neuropathy. These are all discussed in detail with Mary Robinson.  - I have counseled Mary Robinson on diet  and weight management  by adopting a carbohydrate restricted/protein rich diet. Patient is encouraged to switch to  unprocessed or minimally processed     complex starch and increased protein intake (animal or plant source), fruits, and vegetables. -  she is advised to stick to a routine mealtimes to eat 3 meals  a day and avoid unnecessary snacks ( to snack only to correct hypoglycemia).   - she acknowledges that there is a room for improvement in Mary Robinson  food and drink choices. - Suggestion is made for Mary Robinson to avoid simple carbohydrates  from Mary Robinson diet including Cakes, Sweet Desserts, Ice Cream, Soda (diet and regular), Sweet Tea, Candies, Chips, Cookies, Store Bought Juices, Alcohol , Artificial Sweeteners,  Coffee Creamer, and "Sugar-free" Products, Lemonade. This will help patient to have more stable blood glucose profile and potentially avoid unintended weight gain.  The following Lifestyle Medicine recommendations according to American College of Lifestyle Medicine  [facility]) were discussed and and offered to patient and she  agrees to start the journey:  A. Whole Foods, Plant-Based Nutrition comprising of fruits and vegetables, plant-based proteins, whole-grain carbohydrates was discussed in detail with the patient.   A list for source of those nutrients were also provided to the patient.  Patient will use only water or unsweetened tea for hydration. B.  The need to stay away from risky substances including alcohol, smoking; obtaining 7 to 9 hours of restorative sleep, at least 150 minutes of moderate intensity exercise weekly, the importance of healthy social connections,  and stress management techniques were discussed. C.  A full color page of  Calorie density of various food groups per pound showing examples of each food groups was provided to the patient.   - she has been scheduled with Norm Salt, RDN, CDE for diabetes education.  - I have approached Mary Robinson  with the following individualized plan to manage  Mary Robinson diabetes and patient agrees:  She did not tolerate Rybelsus in the past.  She did not tolerate SGLT2 inhibitors including Farxiga even at a lower dose of 5 mg.  She was offered additional intervention with glipizide 5 mg p.o. daily at breakfast on top of metformin, however she wishes to delay this intervention at this time.    -Accordingly, she is advised to continue to take the full dose of metformin 1000 mg p.o. twice daily, advised to  monitor blood glucose twice a day-daily before breakfast and at bedtime.   - she is encouraged to call clinic for blood glucose levels less than 70 or above 150  mg per DL at fasting .   - Specific targets for  A1c;  LDL, HDL,  and Triglycerides were discussed with the patient.  2) Blood Pressure /Hypertension: -Mary Robinson blood pressure is controlled to target.  she is advised to continue Mary Robinson current medications including amlodipine 10 mg p.o. daily, losartan 100 mg p.o. daily with breakfast.     3) Lipids/Hyperlipidemia:   Review of Mary Robinson recent lipid panel showed  controlled  LDL at 63 .  she  is advised to continue simvastatin 20 mg p.o. daily at bedtime.    Side effects and precautions discussed with Mary Robinson.    4)  Weight/Diet:  Body mass index is 34.12 kg/m.  -She presents with progressive weight gain.     she is  a candidate for more weight loss. I discussed with Mary Robinson the fact that loss of 5 - 10% of Mary Robinson  current body weight will have the most impact on Mary Robinson diabetes management.  Exercise, and detailed carbohydrates information provided  -  detailed on discharge instructions.  5) Chronic Care/Health Maintenance:  -she   on ACEI/ARB and Statin medications and  is encouraged to initiate and continue to follow up with Ophthalmology, Dentist,  Podiatrist at least yearly or according to recommendations, and advised to   stay away from smoking. I have recommended yearly flu vaccine and pneumonia vaccine at least every 5 years; moderate intensity exercise for up to 150 minutes weekly; and  sleep for at least 7 hours a day.  Mary Robinson foot exam was negative today.  - she is  advised to maintain close follow up with Benetta Spar, MD for primary care needs, as well as Mary Robinson other providers for optimal and coordinated care.    I spent  26  minutes in the care of the patient today including review of labs from CMP, Lipids, Thyroid Function, Hematology (current and previous including abstractions from other  facilities); face-to-face time discussing  Mary Robinson blood glucose readings/logs, discussing hypoglycemia and hyperglycemia episodes and symptoms, medications doses, Mary Robinson options of short and long term treatment based on the latest standards of care / guidelines;  discussion about incorporating lifestyle medicine;  and documenting the encounter. Risk reduction counseling performed per USPSTF guidelines to reduce  obesity and cardiovascular risk factors.     Please refer to Patient Instructions for Blood Glucose Monitoring and Insulin/Medications Dosing Guide"  in media tab for additional information. Please  also refer to " Patient Self Inventory" in the Media  tab for reviewed elements of pertinent patient history.  Placido Sou participated in the discussions, expressed understanding, and voiced agreement with the above plans.  All questions were answered to Mary Robinson satisfaction. she is encouraged to contact clinic should she have any questions or concerns prior to Mary Robinson return  visit.    Follow up plan: - Return in about 6 months (around 09/18/2023) for F/U with Pre-visit Labs, Meter/CGM/Logs, A1c here.  Marquis Lunch, MD [facility] [address] [place], [place] [address] Phone: [phone]  Fax: [phone]    03/18/2023, 8:29 PM  This note was partially dictated with voice recognition software. Similar sounding words can be transcribed inadequately or may not  be corrected upon review.

## 2023-04-01 DIAGNOSIS — I10 Essential (primary) hypertension: Secondary | ICD-10-CM | POA: Diagnosis not present

## 2023-04-01 DIAGNOSIS — E1165 Type 2 diabetes mellitus with hyperglycemia: Secondary | ICD-10-CM | POA: Diagnosis not present

## 2023-04-08 DIAGNOSIS — E1165 Type 2 diabetes mellitus with hyperglycemia: Secondary | ICD-10-CM | POA: Diagnosis not present

## 2023-04-08 DIAGNOSIS — I10 Essential (primary) hypertension: Secondary | ICD-10-CM | POA: Diagnosis not present

## 2023-04-08 DIAGNOSIS — Z0001 Encounter for general adult medical examination with abnormal findings: Secondary | ICD-10-CM | POA: Diagnosis not present

## 2023-04-08 DIAGNOSIS — L84 Corns and callosities: Secondary | ICD-10-CM | POA: Diagnosis not present

## 2023-04-15 DIAGNOSIS — M79671 Pain in right foot: Secondary | ICD-10-CM | POA: Diagnosis not present

## 2023-04-15 DIAGNOSIS — M67471 Ganglion, right ankle and foot: Secondary | ICD-10-CM | POA: Diagnosis not present

## 2023-05-09 DIAGNOSIS — E1165 Type 2 diabetes mellitus with hyperglycemia: Secondary | ICD-10-CM | POA: Diagnosis not present

## 2023-05-09 DIAGNOSIS — I10 Essential (primary) hypertension: Secondary | ICD-10-CM | POA: Diagnosis not present

## 2023-06-08 DIAGNOSIS — E1165 Type 2 diabetes mellitus with hyperglycemia: Secondary | ICD-10-CM | POA: Diagnosis not present

## 2023-06-08 DIAGNOSIS — I10 Essential (primary) hypertension: Secondary | ICD-10-CM | POA: Diagnosis not present

## 2023-06-18 ENCOUNTER — Other Ambulatory Visit: Payer: Self-pay | Admitting: "Endocrinology

## 2023-07-09 DIAGNOSIS — E1165 Type 2 diabetes mellitus with hyperglycemia: Secondary | ICD-10-CM | POA: Diagnosis not present

## 2023-07-09 DIAGNOSIS — I10 Essential (primary) hypertension: Secondary | ICD-10-CM | POA: Diagnosis not present

## 2023-08-04 ENCOUNTER — Other Ambulatory Visit (HOSPITAL_COMMUNITY): Payer: Self-pay | Admitting: Internal Medicine

## 2023-08-04 DIAGNOSIS — Z1231 Encounter for screening mammogram for malignant neoplasm of breast: Secondary | ICD-10-CM

## 2023-08-08 DIAGNOSIS — I10 Essential (primary) hypertension: Secondary | ICD-10-CM | POA: Diagnosis not present

## 2023-08-08 DIAGNOSIS — E1165 Type 2 diabetes mellitus with hyperglycemia: Secondary | ICD-10-CM | POA: Diagnosis not present

## 2023-08-13 ENCOUNTER — Encounter (HOSPITAL_COMMUNITY): Payer: Self-pay

## 2023-08-13 ENCOUNTER — Ambulatory Visit (HOSPITAL_COMMUNITY)
Admission: RE | Admit: 2023-08-13 | Discharge: 2023-08-13 | Disposition: A | Discharge status: Home / Self Care | Payer: Medicare Other | Source: Ambulatory Visit | Attending: Internal Medicine | Admitting: Internal Medicine

## 2023-08-13 DIAGNOSIS — Z1231 Encounter for screening mammogram for malignant neoplasm of breast: Secondary | ICD-10-CM | POA: Diagnosis not present

## 2023-09-09 DIAGNOSIS — M25421 Effusion, right elbow: Secondary | ICD-10-CM | POA: Diagnosis not present

## 2023-09-09 DIAGNOSIS — S52041A Displaced fracture of coronoid process of right ulna, initial encounter for closed fracture: Secondary | ICD-10-CM | POA: Diagnosis not present

## 2023-09-09 DIAGNOSIS — R6889 Other general symptoms and signs: Secondary | ICD-10-CM | POA: Diagnosis not present

## 2023-09-09 DIAGNOSIS — W000XXA Fall on same level due to ice and snow, initial encounter: Secondary | ICD-10-CM | POA: Diagnosis not present

## 2023-09-09 DIAGNOSIS — G8911 Acute pain due to trauma: Secondary | ICD-10-CM | POA: Diagnosis not present

## 2023-09-09 DIAGNOSIS — S52201A Unspecified fracture of shaft of right ulna, initial encounter for closed fracture: Secondary | ICD-10-CM | POA: Diagnosis not present

## 2023-09-09 DIAGNOSIS — M7711 Lateral epicondylitis, right elbow: Secondary | ICD-10-CM | POA: Diagnosis not present

## 2023-09-09 DIAGNOSIS — R609 Edema, unspecified: Secondary | ICD-10-CM | POA: Diagnosis not present

## 2023-09-09 DIAGNOSIS — W19XXXA Unspecified fall, initial encounter: Secondary | ICD-10-CM | POA: Diagnosis not present

## 2023-09-09 DIAGNOSIS — S42401A Unspecified fracture of lower end of right humerus, initial encounter for closed fracture: Secondary | ICD-10-CM | POA: Diagnosis not present

## 2023-09-09 DIAGNOSIS — M25521 Pain in right elbow: Secondary | ICD-10-CM | POA: Diagnosis not present

## 2023-09-09 DIAGNOSIS — Z743 Need for continuous supervision: Secondary | ICD-10-CM | POA: Diagnosis not present

## 2023-09-10 ENCOUNTER — Telehealth: Payer: Self-pay | Admitting: *Deleted

## 2023-09-10 ENCOUNTER — Encounter: Payer: Self-pay | Admitting: *Deleted

## 2023-09-10 DIAGNOSIS — E1165 Type 2 diabetes mellitus with hyperglycemia: Secondary | ICD-10-CM | POA: Diagnosis not present

## 2023-09-10 DIAGNOSIS — I10 Essential (primary) hypertension: Secondary | ICD-10-CM | POA: Diagnosis not present

## 2023-09-10 NOTE — Patient Outreach (Signed)
 Care Coordination   Initial Visit Note   09/10/2023 Name: Mary Robinson MRN: [id number] DOB: [birth year]  Mary Robinson is a 68 y.o. year old female who sees Mary, Benita Area, MD for primary care. I spoke with  Mary Robinson by phone today. I reached out to Mary Robinson to follow-up on ED visit yesterday as well as to offer Care Management services.   Mary. Robinson was given information about Complex Care Management services today including:   The Complex Care Management services include support from the care team which includes your Nurse Coordinator, Clinical Social Worker, or Pharmacist.  The Complex Care Management team is here to help remove barriers to the health concerns and goals most important to you. Complex Care Management services are voluntary, and the patient may decline or stop services at any time by request to their care team member.   Complex Care Management Consent Status: Patient agreed to services and verbal consent obtained.   What matters to the patients health and wellness today?  Getting assistance with ADLs    Goals Addressed             This Visit's Progress    Care Management Needs       Care Management Goals: Patient will schedule a follow-up appointment with Mary Robinson with [facility] [phone]) within the next week or two Patient will talk with provider about whether HHPT or HHOT services are need and if so if aide services can be added Patient will consider private duty in home care assistance either through [facility] or another agency to assist with ADLs Patient will reach out to provider with any new or worsening symptoms Patient will apply ice to elbow for 20 minutes at a time several times a day as directed  Patient will take pain mediation as directed Patient will practice fall precautions Patient will reach out to RN Care manager at [phone] with any resource or care coordination needs       SDOH assessments and interventions  completed:  Yes  SDOH Interventions Today    Flowsheet Row Most Recent Value  SDOH Interventions   Food Insecurity Interventions Intervention Not Indicated  Housing Interventions Intervention Not Indicated  Transportation Interventions Intervention Not Indicated  Utilities Interventions Intervention Not Indicated  Financial Strain Interventions Intervention Not Indicated  Health Literacy Interventions Intervention Not Indicated        Care Coordination Interventions:  Yes, provided  Interventions Today    Flowsheet Row Most Recent Value  Chronic Disease   Chronic disease during today's visit Diabetes, Other  [Displaced fracture of coronoid process of right ulna]  General Interventions   General Interventions Discussed/Reviewed General Interventions Discussed, General Interventions Reviewed, Labs, Durable Medical Equipment (DME), Doctor Visits  Labs Hgb A1c every 3 months  Doctor Visits Discussed/Reviewed Doctor Visits Discussed, Doctor Visits Reviewed, Specialist, PCP  [ED Visit [facility] on 09/09/23 for Displaced fracture of coronoid process of right ulna due to fall on ice. Fracture was reduced in the ED with mild sedation. Surgery is not needed.]  Durable Medical Equipment (DME) Glucomoter, Other  [elbow splint and sling]  PCP/Specialist Visits Compliance with follow-up visit  [advised to follow-up with orthopedic surgeon, Mary Robinson, with [facility]. Should receive a call this week for scheduling. Seeing Mary Robinson for DM on 09/19/23.]  Exercise Interventions   Exercise Discussed/Reviewed Physical Activity  Physical Activity Discussed/Reviewed Physical Activity Discussed, Physical Activity Reviewed  [able to perform some ADLs but needs assistance with bathing  since she is right handed.]  Education Interventions   Education Provided Provided Education  Provided Verbal Education On [facility], When to see the doctor, Medication  [Self pay in home care options through  [facility]. Advised that for insurance to cover she would need home health orders for a skilled service like PT or OT & if aide services are avaialble, they may be ordered. Needs a face-to-face office visit for order.]  Pharmacy Interventions   Pharmacy Dicussed/Reviewed Pharmacy Topics Discussed, Pharmacy Topics Reviewed, Medications and their functions  [pain medication is helping with pain and aiding sleep]  Safety Interventions   Safety Discussed/Reviewed Safety Discussed, Safety Reviewed, Fall Risk, Home Safety  [fall due to ice which resulted in fracture. Fall safety precautions discussed.]       Follow up plan: Follow up call scheduled for 09/12/23    Encounter Outcome:  Patient Visit Completed   Mary Pellet, RN, BSN Care Manager   [facility]  Population Health  Direct Dial: [phone] Main #: [phone]

## 2023-09-11 ENCOUNTER — Encounter: Payer: Self-pay | Admitting: *Deleted

## 2023-09-11 ENCOUNTER — Telehealth: Payer: Self-pay | Admitting: *Deleted

## 2023-09-11 NOTE — Patient Outreach (Signed)
  Care Coordination   Follow Up Visit Note   09/11/2023 Name: Mary Robinson MRN: [id number] DOB: Oct 28, 1955  Mary Robinson is a 68 y.o. year old female who sees Fanta, Benita Area, MD for primary care. I spoke with  Modestine D Elahi by phone today. I returned patient's call with contact information for Dr Heyward with [facility] in [place] as she requested. She was told at the ED that they would call to schedule an appointment, but she hasn't been outreached by their office.   What matters to the patients health and wellness today?  Scheduling follow-up appointment with orthopedist    Goals Addressed             This Visit's Progress    Care Management Needs   On track    Care Management Goals: Patient will call Dr Heyward with [facility] [phone]) to schedule a follow-up appointment Patient will talk with provider about whether HHPT or HHOT services are need and if so if aide services can be added Patient will consider private duty in home care assistance either through [facility] or another agency to assist with ADLs Patient will reach out to provider with any new or worsening symptoms Patient will apply ice to elbow for 20 minutes at a time several times a day as directed  Patient will take pain mediation as directed Patient will practice fall precautions Patient will reach out to RN Care manager at [phone] with any resource or care coordination needs        SDOH assessments and interventions completed:  No     Care Coordination Interventions:  Yes, provided  Interventions Today    Flowsheet Row Most Recent Value  Chronic Disease   Chronic disease during today's visit Other  [Displaced fracture of coronoid process of right ulna]  General Interventions   General Interventions Discussed/Reviewed Doctor Visits, General Interventions Reviewed, General Interventions Discussed  Doctor Visits Discussed/Reviewed Doctor Visits Discussed, Specialist   PCP/Specialist Visits Compliance with follow-up visit  [Patient has not received a call from [facility] in [place] to schedule a follow-up appointment. Provided with telephone number and patient will reach out to their office for scheduling.]  Education Interventions   Education Provided Provided Education  Provided Verbal Education On When to see the doctor, Other  [possible need for physical therapy once fracture has healed]       Follow up plan: Follow up call scheduled for 09/12/23    Encounter Outcome:  Patient Visit Completed   Josette Pellet, RN, BSN Care Manager [facility]  Population Health  Direct Dial: [phone] Main #: [phone]

## 2023-09-12 ENCOUNTER — Ambulatory Visit: Payer: Self-pay | Admitting: *Deleted

## 2023-09-12 ENCOUNTER — Encounter: Payer: Self-pay | Admitting: *Deleted

## 2023-09-12 NOTE — Patient Outreach (Signed)
  Care Coordination   Follow Up Visit Note   09/12/2023 Name: Mary Robinson MRN: [id number] DOB: Feb 07, 1956  Mary Robinson is a 68 y.o. year old female who sees Mary Robinson, Mary Area, MD for primary care. I spoke with  Mary Robinson by phone today.  What matters to the patients health and wellness today?  Following up with orthopedic surgeon    Goals Addressed             This Visit's Progress    COMPLETED: Care Management Needs       Care Management Goals: Patient will keep appointment with Mary Robinson with [facility] [phone]) on 09/15/23 Patient will take pain mediation as directed Patient will practice fall precautions Patient will reach out to RN Care manager at [phone] with any resource or care coordination needs        SDOH assessments and interventions completed:  Yes  SDOH Interventions Today    Flowsheet Row Most Recent Value  SDOH Interventions   Transportation Interventions Intervention Not Indicated        Care Coordination Interventions:  Yes, provided  Interventions Today    Flowsheet Row Most Recent Value  Chronic Disease   Chronic disease during today's visit Other  [Displaced fracture of coronoid process of right uln]  General Interventions   General Interventions Discussed/Reviewed General Interventions Discussed, General Interventions Reviewed, Doctor Visits  Doctor Visits Discussed/Reviewed Doctor Visits Discussed, Specialist  PCP/Specialist Visits Compliance with follow-up visit  [Scheduled for initial visit with Mary Robinson at [facility] in [place] on 09/15/23]  Exercise Interventions   Exercise Discussed/Reviewed Physical Activity  Physical Activity Discussed/Reviewed Physical Activity Discussed, Physical Activity Reviewed  [Able to perform most ADLs independenlty and has some assistance at home. Considering private duty in-home care.]  Education Interventions   Education Provided Provided Education  Provided Verbal  Education On When to see the doctor, Medication  Pharmacy Interventions   Pharmacy Dicussed/Reviewed Pharmacy Topics Discussed, Pharmacy Topics Reviewed, Medications and their functions  [taking pain medication as instructed]  Safety Interventions   Safety Discussed/Reviewed Safety Discussed, Safety Reviewed, Fall Risk, Home Safety  Home Safety Assistive Devices       Follow up plan: No further intervention required.   Encounter Outcome:  Patient Visit Completed   Mary Pellet, RN, BSN Care Manager [facility]  [facility]  Population Health  Direct Dial: [phone] Main #: [phone]

## 2023-09-15 DIAGNOSIS — E1165 Type 2 diabetes mellitus with hyperglycemia: Secondary | ICD-10-CM | POA: Diagnosis not present

## 2023-09-15 DIAGNOSIS — W19XXXA Unspecified fall, initial encounter: Secondary | ICD-10-CM | POA: Diagnosis not present

## 2023-09-15 DIAGNOSIS — M25521 Pain in right elbow: Secondary | ICD-10-CM | POA: Diagnosis not present

## 2023-09-15 DIAGNOSIS — E782 Mixed hyperlipidemia: Secondary | ICD-10-CM | POA: Diagnosis not present

## 2023-09-15 DIAGNOSIS — S52041A Displaced fracture of coronoid process of right ulna, initial encounter for closed fracture: Secondary | ICD-10-CM | POA: Diagnosis not present

## 2023-09-16 LAB — COMPREHENSIVE METABOLIC PANEL
ALT: 29 [IU]/L (ref 0–32)
AST: 26 [IU]/L (ref 0–40)
Albumin: 4.1 g/dL (ref 3.9–4.9)
Alkaline Phosphatase: 67 [IU]/L (ref 44–121)
BUN/Creatinine Ratio: 13 (ref 12–28)
BUN: 13 mg/dL (ref 8–27)
Bilirubin Total: 0.3 mg/dL (ref 0.0–1.2)
CO2: 28 mmol/L (ref 20–29)
Calcium: 9.7 mg/dL (ref 8.7–10.3)
Chloride: 97 mmol/L (ref 96–106)
Creatinine, Ser: 0.99 mg/dL (ref 0.57–1.00)
Globulin, Total: 3.1 g/dL (ref 1.5–4.5)
Glucose: 138 mg/dL — ABNORMAL HIGH (ref 70–99)
Potassium: 3.8 mmol/L (ref 3.5–5.2)
Sodium: 140 mmol/L (ref 134–144)
Total Protein: 7.2 g/dL (ref 6.0–8.5)
eGFR: 62 mL/min/{1.73_m2} (ref >59)

## 2023-09-16 LAB — LIPID PANEL
Chol/HDL Ratio: 3.7 {ratio} (ref 0.0–4.4)
Cholesterol, Total: 186 mg/dL (ref 100–199)
HDL: 50 mg/dL (ref >39)
LDL Chol Calc (NIH): 105 mg/dL — ABNORMAL HIGH (ref 0–99)
Triglycerides: 180 mg/dL — ABNORMAL HIGH (ref 0–149)
VLDL Cholesterol Cal: 31 mg/dL (ref 5–40)

## 2023-09-19 ENCOUNTER — Ambulatory Visit: Payer: Medicare Other | Admitting: "Endocrinology

## 2023-09-19 ENCOUNTER — Encounter: Payer: Self-pay | Admitting: "Endocrinology

## 2023-09-19 VITALS — BP 118/66 | HR 76 | Ht 64.0 in | Wt 195.8 lb

## 2023-09-19 DIAGNOSIS — E559 Vitamin D deficiency, unspecified: Secondary | ICD-10-CM | POA: Diagnosis not present

## 2023-09-19 DIAGNOSIS — E66811 Obesity, class 1: Secondary | ICD-10-CM | POA: Diagnosis not present

## 2023-09-19 DIAGNOSIS — E782 Mixed hyperlipidemia: Secondary | ICD-10-CM | POA: Diagnosis not present

## 2023-09-19 DIAGNOSIS — Z6834 Body mass index (BMI) 34.0-34.9, adult: Secondary | ICD-10-CM

## 2023-09-19 DIAGNOSIS — E1165 Type 2 diabetes mellitus with hyperglycemia: Secondary | ICD-10-CM | POA: Diagnosis not present

## 2023-09-19 DIAGNOSIS — Z7984 Long term (current) use of oral hypoglycemic drugs: Secondary | ICD-10-CM | POA: Diagnosis not present

## 2023-09-19 DIAGNOSIS — I10 Essential (primary) hypertension: Secondary | ICD-10-CM

## 2023-09-19 DIAGNOSIS — E6609 Other obesity due to excess calories: Secondary | ICD-10-CM

## 2023-09-19 LAB — POCT GLYCOSYLATED HEMOGLOBIN (HGB A1C)

## 2023-09-19 NOTE — Patient Instructions (Signed)

## 2023-09-19 NOTE — Progress Notes (Signed)
09/19/2023, 11:59 AM   Endocrinology follow-up note  Subjective:    Patient ID: Mary Robinson, female    DOB: 01-19-1956.  Mary Robinson is being seen in follow-up after she was seen in consultation for management of currently uncontrolled symptomatic diabetes requested by  Benetta Spar, MD.   Past Medical History:  Diagnosis Date   Anemia    Anxiety    Arthritis    Asthma    Bursitis    Chronic back pain    Chronic constipation    Depression    Diastasis recti    Essential hypertension    GERD (gastroesophageal reflux disease)    Helicobacter pylori gastritis    Treated in May 2011   Hyperlipidemia    Obesity    S/P colonoscopy 12/2009   Multiple tubular adenomas: next TCS May 2014   S/P endoscopy 12/2009   Severe erosive reflux esophagitis, noncritial schatzki's ring, +h.pylori   Seasonal allergies    Sleep apnea    Type II diabetes mellitus (HCC)    Umbilical hernia     Past Surgical History:  Procedure Laterality Date   CHOLECYSTECTOMY  1999   COLONOSCOPY  May 2011   Dr. Jena Gauss: long, torturous colon, multiple colonic polyps. TUBULAR ADENOMAS   COLONOSCOPY N/A 04/15/2013   normal rectum, torturous colon.  tubular adenomas. Surveillance due 2019.    DILATION AND CURETTAGE OF UTERUS  1970's   ESOPHAGOGASTRODUODENOSCOPY  May 2011   Dr. Jena Gauss: severe erosive reflux esophagitis, non-critical Schatzki's ring, diffuse gastric erosions. H. PYLORI GASTRITIS   HERNIA REPAIR  05/04/2013   laparoscopic incisional hernia repair w/mesh per notes(05/04/2013)   INCISIONAL HERNIA REPAIR N/A 05/04/2013   Procedure: LAPAROSCOPIC INCISIONAL HERNIA ;  Surgeon: Ernestene Mention, MD;  Location: [facility] OR;  Service: General;  Laterality: N/A;   INSERTION OF MESH N/A 05/04/2013   Procedure: INSERTION OF MESH;  Surgeon: Ernestene Mention, MD;  Location: [facility] OR;  Service: General;  Laterality: N/A;    TUBAL LIGATION  1970's    Social History   Socioeconomic History   Marital status: Single    Spouse name: Not on file   Number of children: Not on file   Years of education: Not on file   Highest education level: Not on file  Occupational History   Occupation: retired  Tobacco Use   Smoking status: Never   Smokeless tobacco: Never  Vaping Use   Vaping status: Never Used  Substance and Sexual Activity   Alcohol use: No   Drug use: No   Sexual activity: Never    Birth control/protection: Post-menopausal  Other Topics Concern   Not on file  Social History Narrative   Not on file   Social Drivers of Health   Financial Resource Strain: Low Risk  (09/10/2023)   Overall Financial Resource Strain (CARDIA)    Difficulty of Paying Living Expenses: Not very hard  Food Insecurity: No Food Insecurity (09/10/2023)   Hunger Vital Sign    Worried About Running Out of Food in the Last Year: Never true    Ran Out of Food in the  Last Year: Never true  Transportation Needs: No Transportation Needs (09/12/2023)   PRAPARE - Administrator, Civil Service (Medical): No    Lack of Transportation (Non-Medical): No  Physical Activity: Not on file  Stress: Not on file  Social Connections: Unknown (01/15/2022)   [facility]   Social Network    Social Network: Not on file    Family History  Problem Relation Age of Onset   Colon cancer Maternal Uncle    Colon cancer Paternal Uncle    COPD Mother    Hypertension Mother    Seizures Mother    Chronic Renal Failure Mother    Hyperlipidemia Mother    Stroke Mother    Kidney disease Mother    Heart failure Mother    Heart disease Father    Diabetes Sister    Hypertension Sister    Heart disease Sister    Diabetes Brother    Hypertension Brother    Hypertension Daughter    Hypertension Son    Diabetes Sister    Hypertension Sister    Diabetes Sister    Hypertension Sister     Outpatient  Encounter Medications as of 09/19/2023  Medication Sig   methocarbamol (ROBAXIN) 500 MG tablet Take 1,000 mg by mouth 3 (three) times daily as needed.   ACCU-CHEK GUIDE test strip TEST GLUCOSE 2 TIMES DAILY.   acetaminophen (TYLENOL) 500 MG tablet Take 500-1,000 mg by mouth every 6 (six) hours as needed for moderate pain.    albuterol (VENTOLIN HFA) 108 (90 Base) MCG/ACT inhaler Inhale 2 puffs into the lungs every 6 (six) hours as needed for wheezing or shortness of breath.   amLODipine (NORVASC) 10 MG tablet Take 10 mg by mouth daily with breakfast.    aspirin 81 MG tablet Take 81 mg by mouth daily.     Blood Glucose Monitoring Suppl (ACCU-CHEK GUIDE ME) w/Device KIT 1 Piece by Does not apply route as directed.   doxycycline (VIBRAMYCIN) 100 MG capsule Take 1 capsule (100 mg total) by mouth 2 (two) times daily.   hydrochlorothiazide (HYDRODIURIL) 25 MG tablet Take 25 mg by mouth daily.   loratadine (CLARITIN) 10 MG tablet Take 10 mg by mouth daily.     magnesium oxide (MAG-OX) 400 MG tablet Take 400 mg by mouth daily.    metFORMIN (GLUCOPHAGE) 500 MG tablet TAKE 2 TABLETS (1,000 MG TOTAL) BY MOUTH 2 (TWO) TIMES DAILY WITH A MEAL.   omeprazole (PRILOSEC) 20 MG capsule Take 20 mg by mouth daily with breakfast.    polyethylene glycol (MIRALAX / GLYCOLAX) packet Take 17 g by mouth daily as needed (for constipation).   predniSONE (DELTASONE) 50 MG tablet One tablet a day for 5 days   simvastatin (ZOCOR) 20 MG tablet Take 20 mg by mouth daily.   spironolactone (ALDACTONE) 25 MG tablet Take 50 mg by mouth once. (Patient not taking: Reported on 11/11/2022)   Vitamin D, Ergocalciferol, (DRISDOL) 1.25 MG (50000 UNIT) CAPS capsule Take 50,000 Units by mouth every 30 (thirty) days.   No facility-administered encounter medications on file as of 09/19/2023.    ALLERGIES: No Known Allergies  VACCINATION STATUS: Immunization History  Administered Date(s) Administered   Influenza-Unspecified 06/29/2014,  07/03/2018    Diabetes She presents for her follow-up diabetic visit. She has type 2 diabetes mellitus. Her disease course has been improving. There are no hypoglycemic associated symptoms. Pertinent negatives for hypoglycemia include no confusion, headaches, pallor or seizures. Pertinent  negatives for diabetes include no chest pain, no fatigue, no polydipsia, no polyphagia and no polyuria. There are no hypoglycemic complications. Symptoms are improving. Risk factors for coronary artery disease include dyslipidemia, diabetes mellitus, family history, obesity, hypertension, sedentary lifestyle and post-menopausal. Current diabetic treatments: She is currently on Metformin 1000 mg p.o. twice daily, Tradjenta 5 mg p.o. daily. Her weight is fluctuating minimally. She is following a generally unhealthy diet. When asked about meal planning, she reported none. She has not had a previous visit with a dietitian. She rarely participates in exercise. Her home blood glucose trend is decreasing steadily. Her breakfast blood glucose range is generally 130-140 mg/dl. Her bedtime blood glucose range is generally 140-180 mg/dl. Her overall blood glucose range is 140-180 mg/dl. Mary Robinson presents with her meter and logs showing improved glycemic profile.  Her point-of-care A1c is 7.4%, progressively improving from 8.3%.     ) An ACE inhibitor/angiotensin II receptor blocker is being taken. She does not see a podiatrist.Eye exam is current.  Hyperlipidemia This is a chronic problem. The current episode started more than 1 year ago. The problem is uncontrolled. Exacerbating diseases include diabetes and obesity. Pertinent negatives include no chest pain, myalgias or shortness of breath. Current antihyperlipidemic treatment includes statins. Risk factors for coronary artery disease include diabetes mellitus, dyslipidemia, hypertension, a sedentary lifestyle, post-menopausal, obesity and family history.  Hypertension This is  a chronic problem. The current episode started more than 1 year ago. The problem is controlled. Pertinent negatives include no chest pain, headaches, palpitations or shortness of breath. Risk factors for coronary artery disease include dyslipidemia, diabetes mellitus, obesity, sedentary lifestyle, family history and post-menopausal state. Past treatments include angiotensin blockers and calcium channel blockers.     Review of Systems  Constitutional:  Negative for chills, fatigue, fever and unexpected weight change.  HENT:  Negative for trouble swallowing and voice change.   Eyes:  Negative for visual disturbance.  Respiratory:  Negative for cough, shortness of breath and wheezing.   Cardiovascular:  Negative for chest pain, palpitations and leg swelling.  Gastrointestinal:  Negative for diarrhea, nausea and vomiting.  Endocrine: Negative for cold intolerance, heat intolerance, polydipsia, polyphagia and polyuria.  Musculoskeletal:  Negative for arthralgias and myalgias.  Skin:  Negative for color change, pallor, rash and wound.  Neurological:  Negative for seizures and headaches.  Psychiatric/Behavioral:  Negative for confusion and suicidal ideas.     Objective:       09/19/2023   10:06 AM 03/18/2023    3:22 PM 01/26/2023    4:30 PM  Vitals with BMI  Height 5\' 4"  5\' 4"    Weight 195 lbs 13 oz 198 lbs 13 oz   BMI 33.59 34.11   Systolic 118 126 454  Diastolic 66 78 63  Pulse 76 80 77    BP 118/66   Pulse 76   Ht 5\' 4"  (1.626 m)   Wt 195 lb 12.8 oz (88.8 kg)   BMI 33.61 kg/m   Wt Readings from Last 3 Encounters:  09/19/23 195 lb 12.8 oz (88.8 kg)  03/18/23 198 lb 12.8 oz (90.2 kg)  01/26/23 201 lb (91.2 kg)    She sustained right shoulder injury, not wearing any slings.  She is planning to Return to orthopedics to have her brace/sling reapplied. CMP ( most recent) CMP     Component Value Date/Time   NA 140 09/15/2023 1022   K 3.8 09/15/2023 1022   CL 97 09/15/2023 1022    CO2  28 09/15/2023 1022   GLUCOSE 138 (H) 09/15/2023 1022   GLUCOSE 125 (H) 01/26/2023 1253   BUN 13 09/15/2023 1022   CREATININE 0.99 09/15/2023 1022   CREATININE 1.05 (H) 11/09/2019 1020   CALCIUM 9.7 09/15/2023 1022   PROT 7.2 09/15/2023 1022   ALBUMIN 4.1 09/15/2023 1022   AST 26 09/15/2023 1022   ALT 29 09/15/2023 1022   ALKPHOS 67 09/15/2023 1022   BILITOT 0.3 09/15/2023 1022   GFRNONAA >60 01/26/2023 1253   GFRNONAA 55 (L) 11/09/2019 1020   GFRAA 69 10/10/2020 0000   GFRAA 64 11/09/2019 1020     Diabetic Labs (most recent): Lab Results  Component Value Date   HGBA1C 7.7 (A) 03/18/2023   HGBA1C 8.3 (A) 11/11/2022   HGBA1C 6.9 05/13/2022     Lipid Panel ( most recent) Lipid Panel     Component Value Date/Time   CHOL 186 09/15/2023 1022   TRIG 180 (H) 09/15/2023 1022   HDL 50 09/15/2023 1022   CHOLHDL 3.7 09/15/2023 1022   LDLCALC 105 (H) 09/15/2023 1022   LABVLDL 31 09/15/2023 1022      Assessment & Plan:   1. 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  Mary Robinson presents with her meter and logs showing improved glycemic profile.  Her point-of-care A1c is 7.4%, progressively improving from 8.3%.    -Recent labs reviewed. - I had a long discussion with her about the progressive nature of diabetes and the pathology behind its complications. -her diabetes is complicated by obesity/sedentary life and she remains at a high risk for more acute and chronic complications which include CAD, CVA, CKD, retinopathy, and neuropathy. These are all discussed in detail with her.  - I have counseled her on diet  and weight management  by adopting a carbohydrate restricted/protein rich diet. Patient is encouraged to switch to  unprocessed or minimally processed     complex starch and increased protein intake (animal or plant source), fruits, and vegetables. -  she is advised to  stick to a routine mealtimes to eat 3 meals  a day and avoid unnecessary snacks ( to snack only to correct hypoglycemia).   - she acknowledges that there is a room for improvement in her food and drink choices. - Suggestion is made for her to avoid simple carbohydrates  from her diet including Cakes, Sweet Desserts, Ice Cream, Soda (diet and regular), Sweet Tea, Candies, Chips, Cookies, Store Bought Juices, Alcohol , Artificial Sweeteners,  Coffee Creamer, and "Sugar-free" Products, Lemonade. This will help patient to have more stable blood glucose profile and potentially avoid unintended weight gain.  The following Lifestyle Medicine recommendations according to American College of Lifestyle Medicine  [facility]) were discussed and and offered to patient and she  agrees to start the journey:  A. Whole Foods, Plant-Based Nutrition comprising of fruits and vegetables, plant-based proteins, whole-grain carbohydrates was discussed in detail with the patient.   A list for source of those nutrients were also provided to the patient.  Patient will use only water or unsweetened tea for hydration. B.  The need to stay away from risky substances including alcohol, smoking; obtaining 7 to 9 hours of restorative sleep, at least 150 minutes of moderate intensity exercise weekly, the importance of healthy social connections,  and stress management techniques were discussed. C.  A full color page of  Calorie density of various food  groups per pound showing examples of each food groups was provided to the patient.   - she has been scheduled with Norm Salt, RDN, CDE for diabetes education.  - I have approached her with the following individualized plan to manage  her diabetes and patient agrees:  She did not tolerate Rybelsus in the past.  She did not tolerate SGLT2 inhibitors including Farxiga even at a lower dose of 5 mg.    She is advised to continue metformin 1000 mg p.o. twice daily, willing to continue  monitoring blood glucose twice a day-daily before breakfast and at bedtime.   - she is encouraged to call clinic for blood glucose levels less than 70 or above 150  mg per DL at fasting .   - Specific targets for  A1c;  LDL, HDL,  and Triglycerides were discussed with the patient.  2) Blood Pressure /Hypertension: -Her blood pressure is controlled to target.  she is advised to continue her current medications including amlodipine 10 mg p.o. daily, losartan 100 mg p.o. daily with breakfast.     3) Lipids/Hyperlipidemia:   Review of her recent lipid panel shows worsening LDL at 105.  She is advised to continue simvastatin 20 mg p.o. daily at bedtime.    Side effects and precautions discussed with her.    4)  Weight/Diet:  Body mass index is 33.61 kg/m.  -She presents with progressive weight gain.     she is  a candidate for more weight loss. I discussed with her the fact that loss of 5 - 10% of her  current body weight will have the most impact on her diabetes management.  Exercise, and detailed carbohydrates information provided  -  detailed on discharge instructions.  5) Chronic Care/Health Maintenance:  -she   on ACEI/ARB and Statin medications and  is encouraged to initiate and continue to follow up with Ophthalmology, Dentist,  Podiatrist at least yearly or according to recommendations, and advised to   stay away from smoking. I have recommended yearly flu vaccine and pneumonia vaccine at least every 5 years; moderate intensity exercise for up to 150 minutes weekly; and  sleep for at least 7 hours a day.  Her foot exam was negative today.  She is encouraged to take  her brace/sling to her orthopedist for proper application.  - she is  advised to maintain close follow up with Benetta Spar, MD for primary care needs, as well as her other providers for optimal and coordinated care.    I spent  26  minutes in the care of the patient today including review of labs from CMP, Lipids,  Thyroid Function, Hematology (current and previous including abstractions from other facilities); face-to-face time discussing  her blood glucose readings/logs, discussing hypoglycemia and hyperglycemia episodes and symptoms, medications doses, her options of short and long term treatment based on the latest standards of care / guidelines;  discussion about incorporating lifestyle medicine;  and documenting the encounter. Risk reduction counseling performed per USPSTF guidelines to reduce  obesity and cardiovascular risk factors.     Please refer to Patient Instructions for Blood Glucose Monitoring and Insulin/Medications Dosing Guide"  in media tab for additional information. Please  also refer to " Patient Self Inventory" in the Media  tab for reviewed elements of pertinent patient history.  Placido Sou participated in the discussions, expressed understanding, and voiced agreement with the above plans.  All questions were answered to her satisfaction. she is encouraged to contact  clinic should she have any questions or concerns prior to her return visit.    Follow up plan: - Return in about 4 months (around 01/17/2024) for Bring Meter/CGM Device/Logs- A1c in Office.  Marquis Lunch, MD [facility] [address] [place], [place] [address] Phone: [phone]  Fax: [phone]    09/19/2023, 11:59 AM  This note was partially dictated with voice recognition software. Similar sounding words can be transcribed inadequately or may not  be corrected upon review.

## 2023-09-29 DIAGNOSIS — S52041A Displaced fracture of coronoid process of right ulna, initial encounter for closed fracture: Secondary | ICD-10-CM | POA: Diagnosis not present

## 2023-09-29 DIAGNOSIS — M79641 Pain in right hand: Secondary | ICD-10-CM | POA: Diagnosis not present

## 2023-10-01 DIAGNOSIS — S52041D Displaced fracture of coronoid process of right ulna, subsequent encounter for closed fracture with routine healing: Secondary | ICD-10-CM | POA: Diagnosis not present

## 2023-10-01 DIAGNOSIS — M25621 Stiffness of right elbow, not elsewhere classified: Secondary | ICD-10-CM | POA: Diagnosis not present

## 2023-10-01 DIAGNOSIS — R29898 Other symptoms and signs involving the musculoskeletal system: Secondary | ICD-10-CM | POA: Diagnosis not present

## 2023-10-07 DIAGNOSIS — S52041D Displaced fracture of coronoid process of right ulna, subsequent encounter for closed fracture with routine healing: Secondary | ICD-10-CM | POA: Diagnosis not present

## 2023-10-07 DIAGNOSIS — R29898 Other symptoms and signs involving the musculoskeletal system: Secondary | ICD-10-CM | POA: Diagnosis not present

## 2023-10-07 DIAGNOSIS — M25621 Stiffness of right elbow, not elsewhere classified: Secondary | ICD-10-CM | POA: Diagnosis not present

## 2023-10-09 DIAGNOSIS — M25621 Stiffness of right elbow, not elsewhere classified: Secondary | ICD-10-CM | POA: Diagnosis not present

## 2023-10-09 DIAGNOSIS — R29898 Other symptoms and signs involving the musculoskeletal system: Secondary | ICD-10-CM | POA: Diagnosis not present

## 2023-10-09 DIAGNOSIS — S52041D Displaced fracture of coronoid process of right ulna, subsequent encounter for closed fracture with routine healing: Secondary | ICD-10-CM | POA: Diagnosis not present

## 2023-10-10 DIAGNOSIS — E1165 Type 2 diabetes mellitus with hyperglycemia: Secondary | ICD-10-CM | POA: Diagnosis not present

## 2023-10-10 DIAGNOSIS — I10 Essential (primary) hypertension: Secondary | ICD-10-CM | POA: Diagnosis not present

## 2023-10-10 DIAGNOSIS — K219 Gastro-esophageal reflux disease without esophagitis: Secondary | ICD-10-CM | POA: Diagnosis not present

## 2023-10-10 DIAGNOSIS — Z0001 Encounter for general adult medical examination with abnormal findings: Secondary | ICD-10-CM | POA: Diagnosis not present

## 2023-10-10 DIAGNOSIS — J4541 Moderate persistent asthma with (acute) exacerbation: Secondary | ICD-10-CM | POA: Diagnosis not present

## 2023-10-10 DIAGNOSIS — Z1389 Encounter for screening for other disorder: Secondary | ICD-10-CM | POA: Diagnosis not present

## 2023-10-10 DIAGNOSIS — E785 Hyperlipidemia, unspecified: Secondary | ICD-10-CM | POA: Diagnosis not present

## 2023-10-14 ENCOUNTER — Encounter (INDEPENDENT_AMBULATORY_CARE_PROVIDER_SITE_OTHER): Payer: Self-pay | Admitting: *Deleted

## 2023-10-14 DIAGNOSIS — R29898 Other symptoms and signs involving the musculoskeletal system: Secondary | ICD-10-CM | POA: Diagnosis not present

## 2023-10-14 DIAGNOSIS — S52041D Displaced fracture of coronoid process of right ulna, subsequent encounter for closed fracture with routine healing: Secondary | ICD-10-CM | POA: Diagnosis not present

## 2023-10-14 DIAGNOSIS — M25621 Stiffness of right elbow, not elsewhere classified: Secondary | ICD-10-CM | POA: Diagnosis not present

## 2023-10-16 DIAGNOSIS — Z1212 Encounter for screening for malignant neoplasm of rectum: Secondary | ICD-10-CM | POA: Diagnosis not present

## 2023-10-16 DIAGNOSIS — Z1211 Encounter for screening for malignant neoplasm of colon: Secondary | ICD-10-CM | POA: Diagnosis not present

## 2023-10-17 DIAGNOSIS — M25621 Stiffness of right elbow, not elsewhere classified: Secondary | ICD-10-CM | POA: Diagnosis not present

## 2023-10-17 DIAGNOSIS — S52041D Displaced fracture of coronoid process of right ulna, subsequent encounter for closed fracture with routine healing: Secondary | ICD-10-CM | POA: Diagnosis not present

## 2023-10-17 DIAGNOSIS — R29898 Other symptoms and signs involving the musculoskeletal system: Secondary | ICD-10-CM | POA: Diagnosis not present

## 2023-10-27 DIAGNOSIS — S52041D Displaced fracture of coronoid process of right ulna, subsequent encounter for closed fracture with routine healing: Secondary | ICD-10-CM | POA: Diagnosis not present

## 2023-10-27 DIAGNOSIS — M25621 Stiffness of right elbow, not elsewhere classified: Secondary | ICD-10-CM | POA: Diagnosis not present

## 2023-10-27 DIAGNOSIS — R29898 Other symptoms and signs involving the musculoskeletal system: Secondary | ICD-10-CM | POA: Diagnosis not present

## 2023-10-29 DIAGNOSIS — S52041D Displaced fracture of coronoid process of right ulna, subsequent encounter for closed fracture with routine healing: Secondary | ICD-10-CM | POA: Diagnosis not present

## 2023-10-29 DIAGNOSIS — M25621 Stiffness of right elbow, not elsewhere classified: Secondary | ICD-10-CM | POA: Diagnosis not present

## 2023-10-29 DIAGNOSIS — R29898 Other symptoms and signs involving the musculoskeletal system: Secondary | ICD-10-CM | POA: Diagnosis not present

## 2023-10-31 ENCOUNTER — Other Ambulatory Visit: Payer: Self-pay | Admitting: "Endocrinology

## 2023-11-04 DIAGNOSIS — S52041D Displaced fracture of coronoid process of right ulna, subsequent encounter for closed fracture with routine healing: Secondary | ICD-10-CM | POA: Diagnosis not present

## 2023-11-04 DIAGNOSIS — M25621 Stiffness of right elbow, not elsewhere classified: Secondary | ICD-10-CM | POA: Diagnosis not present

## 2023-11-04 DIAGNOSIS — R29898 Other symptoms and signs involving the musculoskeletal system: Secondary | ICD-10-CM | POA: Diagnosis not present

## 2023-11-07 DIAGNOSIS — E1165 Type 2 diabetes mellitus with hyperglycemia: Secondary | ICD-10-CM | POA: Diagnosis not present

## 2023-11-07 DIAGNOSIS — R29898 Other symptoms and signs involving the musculoskeletal system: Secondary | ICD-10-CM | POA: Diagnosis not present

## 2023-11-07 DIAGNOSIS — M25621 Stiffness of right elbow, not elsewhere classified: Secondary | ICD-10-CM | POA: Diagnosis not present

## 2023-11-07 DIAGNOSIS — I10 Essential (primary) hypertension: Secondary | ICD-10-CM | POA: Diagnosis not present

## 2023-11-07 DIAGNOSIS — S52041D Displaced fracture of coronoid process of right ulna, subsequent encounter for closed fracture with routine healing: Secondary | ICD-10-CM | POA: Diagnosis not present

## 2023-12-01 DIAGNOSIS — E785 Hyperlipidemia, unspecified: Secondary | ICD-10-CM | POA: Diagnosis not present

## 2023-12-01 DIAGNOSIS — Z1159 Encounter for screening for other viral diseases: Secondary | ICD-10-CM | POA: Diagnosis not present

## 2023-12-01 DIAGNOSIS — E1165 Type 2 diabetes mellitus with hyperglycemia: Secondary | ICD-10-CM | POA: Diagnosis not present

## 2023-12-01 DIAGNOSIS — E559 Vitamin D deficiency, unspecified: Secondary | ICD-10-CM | POA: Diagnosis not present

## 2023-12-01 DIAGNOSIS — I10 Essential (primary) hypertension: Secondary | ICD-10-CM | POA: Diagnosis not present

## 2023-12-08 DIAGNOSIS — I10 Essential (primary) hypertension: Secondary | ICD-10-CM | POA: Diagnosis not present

## 2023-12-08 DIAGNOSIS — E1165 Type 2 diabetes mellitus with hyperglycemia: Secondary | ICD-10-CM | POA: Diagnosis not present

## 2023-12-25 DIAGNOSIS — D3132 Benign neoplasm of left choroid: Secondary | ICD-10-CM | POA: Diagnosis not present

## 2024-01-07 DIAGNOSIS — I10 Essential (primary) hypertension: Secondary | ICD-10-CM | POA: Diagnosis not present

## 2024-01-07 DIAGNOSIS — E1165 Type 2 diabetes mellitus with hyperglycemia: Secondary | ICD-10-CM | POA: Diagnosis not present

## 2024-01-19 ENCOUNTER — Ambulatory Visit: Payer: Medicare Other | Admitting: "Endocrinology

## 2024-01-29 DIAGNOSIS — M79671 Pain in right foot: Secondary | ICD-10-CM | POA: Diagnosis not present

## 2024-01-29 DIAGNOSIS — M67471 Ganglion, right ankle and foot: Secondary | ICD-10-CM | POA: Diagnosis not present

## 2024-02-04 DIAGNOSIS — S83421A Sprain of lateral collateral ligament of right knee, initial encounter: Secondary | ICD-10-CM | POA: Diagnosis not present

## 2024-02-04 DIAGNOSIS — M25561 Pain in right knee: Secondary | ICD-10-CM | POA: Diagnosis not present

## 2024-02-07 DIAGNOSIS — E1165 Type 2 diabetes mellitus with hyperglycemia: Secondary | ICD-10-CM | POA: Diagnosis not present

## 2024-02-07 DIAGNOSIS — I10 Essential (primary) hypertension: Secondary | ICD-10-CM | POA: Diagnosis not present

## 2024-02-11 DIAGNOSIS — G8929 Other chronic pain: Secondary | ICD-10-CM | POA: Diagnosis not present

## 2024-02-11 DIAGNOSIS — M1711 Unilateral primary osteoarthritis, right knee: Secondary | ICD-10-CM | POA: Diagnosis not present

## 2024-02-11 DIAGNOSIS — M25561 Pain in right knee: Secondary | ICD-10-CM | POA: Diagnosis not present

## 2024-02-11 DIAGNOSIS — S86111A Strain of other muscle(s) and tendon(s) of posterior muscle group at lower leg level, right leg, initial encounter: Secondary | ICD-10-CM | POA: Diagnosis not present

## 2024-03-07 DIAGNOSIS — E1165 Type 2 diabetes mellitus with hyperglycemia: Secondary | ICD-10-CM | POA: Diagnosis not present

## 2024-03-07 DIAGNOSIS — I10 Essential (primary) hypertension: Secondary | ICD-10-CM | POA: Diagnosis not present

## 2024-03-24 ENCOUNTER — Encounter: Payer: Self-pay | Admitting: "Endocrinology

## 2024-03-24 ENCOUNTER — Ambulatory Visit: Admitting: "Endocrinology

## 2024-03-24 VITALS — BP 134/74 | HR 80 | Ht 64.0 in | Wt 196.0 lb

## 2024-03-24 DIAGNOSIS — E1165 Type 2 diabetes mellitus with hyperglycemia: Secondary | ICD-10-CM | POA: Diagnosis not present

## 2024-03-24 DIAGNOSIS — Z6834 Body mass index (BMI) 34.0-34.9, adult: Secondary | ICD-10-CM

## 2024-03-24 DIAGNOSIS — E6609 Other obesity due to excess calories: Secondary | ICD-10-CM

## 2024-03-24 DIAGNOSIS — Z7984 Long term (current) use of oral hypoglycemic drugs: Secondary | ICD-10-CM | POA: Diagnosis not present

## 2024-03-24 DIAGNOSIS — I10 Essential (primary) hypertension: Secondary | ICD-10-CM | POA: Diagnosis not present

## 2024-03-24 DIAGNOSIS — E782 Mixed hyperlipidemia: Secondary | ICD-10-CM

## 2024-03-24 DIAGNOSIS — E66811 Obesity, class 1: Secondary | ICD-10-CM

## 2024-03-24 LAB — POCT GLYCOSYLATED HEMOGLOBIN (HGB A1C): HbA1c, POC (controlled diabetic range): 7.9 % — AB (ref 0.0–7.0)

## 2024-03-24 NOTE — Patient Instructions (Signed)

## 2024-03-24 NOTE — Progress Notes (Signed)
 03/24/2024, 2:04 PM   Endocrinology follow-up note  Subjective:    Patient ID: Mary Robinson, female    DOB: 01/24/1956.  Mary Robinson is being seen in follow-up after she was seen in consultation for management of currently uncontrolled symptomatic diabetes requested by  Carlette Benita Area, MD.   Past Medical History:  Diagnosis Date   Anemia    Anxiety    Arthritis    Asthma    Bursitis    Chronic back pain    Chronic constipation    Depression    Diastasis recti    Essential hypertension    GERD (gastroesophageal reflux disease)    Helicobacter pylori gastritis    Treated in May 2011   Hyperlipidemia    Obesity    S/P colonoscopy 12/2009   Multiple tubular adenomas: next TCS May 2014   S/P endoscopy 12/2009   Severe erosive reflux esophagitis, noncritial schatzki's ring, +h.pylori   Seasonal allergies    Sleep apnea    Type II diabetes mellitus (HCC)    Umbilical hernia     Past Surgical History:  Procedure Laterality Date   CHOLECYSTECTOMY  1999   COLONOSCOPY  May 2011   Dr. Shaaron: long, torturous colon, multiple colonic polyps. TUBULAR ADENOMAS   COLONOSCOPY N/A 04/15/2013   normal rectum, torturous colon.  tubular adenomas. Surveillance due 2019.    DILATION AND CURETTAGE OF UTERUS  1970's   ESOPHAGOGASTRODUODENOSCOPY  May 2011   Dr. Shaaron: severe erosive reflux esophagitis, non-critical Schatzki's ring, diffuse gastric erosions. H. PYLORI GASTRITIS   HERNIA REPAIR  05/04/2013   laparoscopic incisional hernia repair w/mesh per notes(05/04/2013)   INCISIONAL HERNIA REPAIR N/A 05/04/2013   Procedure: LAPAROSCOPIC INCISIONAL HERNIA ;  Surgeon: Elon CHRISTELLA Pacini, MD;  Location: [facility] OR;  Service: General;  Laterality: N/A;   INSERTION OF MESH N/A 05/04/2013   Procedure: INSERTION OF MESH;  Surgeon: Elon CHRISTELLA Pacini, MD;  Location: [facility] OR;  Service: General;  Laterality: N/A;    TUBAL LIGATION  1970's    Social History   Socioeconomic History   Marital status: Single    Spouse name: Not on file   Number of children: Not on file   Years of education: Not on file   Highest education level: Not on file  Occupational History   Occupation: retired  Tobacco Use   Smoking status: Never   Smokeless tobacco: Never  Vaping Use   Vaping status: Never Used  Substance and Sexual Activity   Alcohol use: No   Drug use: No   Sexual activity: Never    Birth control/protection: Post-menopausal  Other Topics Concern   Not on file  Social History Narrative   Not on file   Social Drivers of Health   Financial Resource Strain: Low Risk  (09/10/2023)   Overall Financial Resource Strain (CARDIA)    Difficulty of Paying Living Expenses: Not very hard  Food Insecurity: No Food Insecurity (09/10/2023)   Hunger Vital Sign    Worried About Running Out of Food in the Last Year: Never true    Ran Out of Food in the  Last Year: Never true  Transportation Needs: No Transportation Needs (09/12/2023)   PRAPARE - Administrator, Civil Service (Medical): No    Lack of Transportation (Non-Medical): No  Physical Activity: Not on file  Stress: Not on file  Social Connections: Unknown (01/15/2022)   [facility]   Social Network    Social Network: Not on file    Family History  Problem Relation Age of Onset   Colon cancer Maternal Uncle    Colon cancer Paternal Uncle    COPD Mother    Hypertension Mother    Seizures Mother    Chronic Renal Failure Mother    Hyperlipidemia Mother    Stroke Mother    Kidney disease Mother    Heart failure Mother    Heart disease Father    Diabetes Sister    Hypertension Sister    Heart disease Sister    Diabetes Brother    Hypertension Brother    Hypertension Daughter    Hypertension Son    Diabetes Sister    Hypertension Sister    Diabetes Sister    Hypertension Sister     Outpatient Encounter Medications  as of 03/24/2024  Medication Sig   ACCU-CHEK GUIDE TEST test strip TEST GLUCOSE 2 TIMES DAILY.   acetaminophen  (TYLENOL ) 500 MG tablet Take 500-1,000 mg by mouth every 6 (six) hours as needed for moderate pain.    albuterol  (VENTOLIN  HFA) 108 (90 Base) MCG/ACT inhaler Inhale 2 puffs into the lungs every 6 (six) hours as needed for wheezing or shortness of breath.   amLODipine  (NORVASC ) 10 MG tablet Take 10 mg by mouth daily with breakfast.    aspirin  81 MG tablet Take 81 mg by mouth daily.     Blood Glucose Monitoring Suppl (ACCU-CHEK GUIDE ME) w/Device KIT 1 Piece by Does not apply route as directed.   doxycycline  (VIBRAMYCIN ) 100 MG capsule Take 1 capsule (100 mg total) by mouth 2 (two) times daily.   hydrochlorothiazide (HYDRODIURIL) 25 MG tablet Take 25 mg by mouth daily.   loratadine  (CLARITIN ) 10 MG tablet Take 10 mg by mouth daily.     magnesium  oxide (MAG-OX) 400 MG tablet Take 400 mg by mouth daily.    metFORMIN  (GLUCOPHAGE ) 500 MG tablet TAKE 2 TABLETS (1,000 MG TOTAL) BY MOUTH 2 (TWO) TIMES DAILY WITH A MEAL.   methocarbamol (ROBAXIN) 500 MG tablet Take 1,000 mg by mouth 3 (three) times daily as needed.   omeprazole (PRILOSEC) 20 MG capsule Take 20 mg by mouth daily with breakfast.    polyethylene glycol (MIRALAX  / GLYCOLAX ) packet Take 17 g by mouth daily as needed (for constipation).   predniSONE  (DELTASONE ) 50 MG tablet One tablet a day for 5 days   simvastatin  (ZOCOR ) 20 MG tablet Take 20 mg by mouth daily.   spironolactone (ALDACTONE) 25 MG tablet Take 50 mg by mouth once. (Patient not taking: Reported on 11/11/2022)   Vitamin D, Ergocalciferol, (DRISDOL) 1.25 MG (50000 UNIT) CAPS capsule Take 50,000 Units by mouth every 30 (thirty) days.   No facility-administered encounter medications on file as of 03/24/2024.    ALLERGIES: No Known Allergies  VACCINATION STATUS: Immunization History  Administered Date(s) Administered   Influenza-Unspecified 06/29/2014, 07/03/2018     Diabetes She presents for her follow-up diabetic visit. She has type 2 diabetes mellitus. Her disease course has been worsening. There are no hypoglycemic associated symptoms. Pertinent negatives for hypoglycemia include no confusion, headaches, pallor or seizures. Pertinent negatives  for diabetes include no chest pain, no fatigue, no polydipsia, no polyphagia and no polyuria. There are no hypoglycemic complications. Symptoms are worsening. Risk factors for coronary artery disease include dyslipidemia, diabetes mellitus, family history, obesity, hypertension, sedentary lifestyle and post-menopausal. Current diabetic treatments: She is currently on Metformin  1000 mg p.o. twice daily, Tradjenta  5 mg p.o. daily. Her weight is fluctuating minimally. She is following a generally unhealthy diet. When asked about meal planning, she reported none. She has not had a previous visit with a dietitian. She rarely participates in exercise. Her home blood glucose trend is increasing steadily. Her breakfast blood glucose range is generally 140-180 mg/dl. Her bedtime blood glucose range is generally 180-200 mg/dl. Her overall blood glucose range is 180-200 mg/dl. Mary Robinson presents with her meter showing average blood glucose between 160-200 mg per DL and her point-of-care J8r 7.9% increasing from 7.4%.  She did not have any hypoglycemia.     ) An ACE inhibitor/angiotensin II receptor blocker is being taken. She does not see a podiatrist.Eye exam is current.  Hyperlipidemia This is a chronic problem. The current episode started more than 1 year ago. The problem is uncontrolled. Exacerbating diseases include diabetes and obesity. Pertinent negatives include no chest pain, myalgias or shortness of breath. Current antihyperlipidemic treatment includes statins. Risk factors for coronary artery disease include diabetes mellitus, dyslipidemia, hypertension, a sedentary lifestyle, post-menopausal, obesity and family history.   Hypertension This is a chronic problem. The current episode started more than 1 year ago. The problem is controlled. Pertinent negatives include no chest pain, headaches, palpitations or shortness of breath. Risk factors for coronary artery disease include dyslipidemia, diabetes mellitus, obesity, sedentary lifestyle, family history and post-menopausal state. Past treatments include angiotensin blockers and calcium channel blockers.       Objective:       03/24/2024    1:18 PM 09/19/2023   10:06 AM 03/18/2023    3:22 PM  Vitals with BMI  Height 5' 4 5' 4 5' 4  Weight 196 lbs 195 lbs 13 oz 198 lbs 13 oz  BMI 33.63 33.59 34.11  Systolic 134 118 873  Diastolic 74 66 78  Pulse 80 76 80    BP 134/74   Pulse 80   Ht 5' 4 (1.626 m)   Wt 196 lb (88.9 kg)   BMI 33.64 kg/m   Wt Readings from Last 3 Encounters:  03/24/24 196 lb (88.9 kg)  09/19/23 195 lb 12.8 oz (88.8 kg)  03/18/23 198 lb 12.8 oz (90.2 kg)     CMP ( most recent) CMP     Component Value Date/Time   NA 140 09/15/2023 1022   K 3.8 09/15/2023 1022   CL 97 09/15/2023 1022   CO2 28 09/15/2023 1022   GLUCOSE 138 (H) 09/15/2023 1022   GLUCOSE 125 (H) 01/26/2023 1253   BUN 13 09/15/2023 1022   CREATININE 0.99 09/15/2023 1022   CREATININE 1.05 (H) 11/09/2019 1020   CALCIUM 9.7 09/15/2023 1022   PROT 7.2 09/15/2023 1022   ALBUMIN 4.1 09/15/2023 1022   AST 26 09/15/2023 1022   ALT 29 09/15/2023 1022   ALKPHOS 67 09/15/2023 1022   BILITOT 0.3 09/15/2023 1022   GFRNONAA >60 01/26/2023 1253   GFRNONAA 55 (L) 11/09/2019 1020   GFRAA 69 10/10/2020 0000   GFRAA 64 11/09/2019 1020     Diabetic Labs (most recent): Lab Results  Component Value Date   HGBA1C 7.9 (A) 03/24/2024   HGBA1C 7.7 (A) 03/18/2023  HGBA1C 8.3 (A) 11/11/2022     Lipid Panel ( most recent) Lipid Panel     Component Value Date/Time   CHOL 186 09/15/2023 1022   TRIG 180 (H) 09/15/2023 1022   HDL 50 09/15/2023 1022   CHOLHDL 3.7  09/15/2023 1022   LDLCALC 105 (H) 09/15/2023 1022   LABVLDL 31 09/15/2023 1022      Assessment & Plan:   1. 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  Mary Robinson presents with her meter showing average blood glucose between 160-200 mg per DL and her point-of-care J8r 7.9% increasing from 7.4%.  She did not have any hypoglycemia.    -Recent labs reviewed. - I had a long discussion with her about the progressive nature of diabetes and the pathology behind its complications. -her diabetes is complicated by obesity/sedentary life and she remains at a high risk for more acute and chronic complications which include CAD, CVA, CKD, retinopathy, and neuropathy. These are all discussed in detail with her.  - I have counseled her on diet  and weight management  by adopting a carbohydrate restricted/protein rich diet. Patient is encouraged to switch to  unprocessed or minimally processed     complex starch and increased protein intake (animal or plant source), fruits, and vegetables. -  she is advised to stick to a routine mealtimes to eat 3 meals  a day and avoid unnecessary snacks ( to snack only to correct hypoglycemia).    - she acknowledges that there is a room for improvement in her food and drink choices. - Suggestion is made for her to avoid simple carbohydrates  from her diet including Cakes, Sweet Desserts, Ice Cream, Soda (diet and regular), Sweet Tea, Candies, Chips, Cookies, Store Bought Juices, Alcohol , Artificial Sweeteners,  Coffee Creamer, and Sugar-free Products, Lemonade. This will help patient to have more stable blood glucose profile and potentially avoid unintended weight gain.   - she has been scheduled with Penny Crumpton, RDN, CDE for diabetes education.  - I have approached her with the following individualized plan to manage  her diabetes and patient agrees:  She did not  tolerate Rybelsus  in the past.  She did not tolerate SGLT2 inhibitors including Farxiga  even at a lower dose of 5 mg.    She was approached for weekly GLP-1 receptor agonist, however patient declines injectable treatment at this time.  She promises to maximize her efforts in diet and exercise.  She is advised  to continue metformin  1000 mg p.o. twice daily, willing to continue monitoring blood glucose twice a day-daily before breakfast and at bedtime.   - she is encouraged to call clinic for blood glucose levels less than 70 or above 150  mg per DL at fasting .   - Specific targets for  A1c;  LDL, HDL,  and Triglycerides were discussed with the patient.  2) Blood Pressure /Hypertension: - Her blood pressure is controlled send.  she is advised to continue her current medications including amlodipine  10 mg p.o. daily, losartan  100 mg p.o. daily with breakfast.     3) Lipids/Hyperlipidemia:   Review of her recent lipid panel shows worsening LDL at 105.  She is advised to continue simvastatin  20 mg p.o. daily at bedtime.     Side effects and precautions discussed with her.    4)  Weight/Diet:  Body mass index is 33.64 kg/m.  -She presents  with progressive weight gain.     she is  a candidate for more weight loss. I discussed with her the fact that loss of 5 - 10% of her  current body weight will have the most impact on her diabetes management.  Exercise, and detailed carbohydrates information provided  -  detailed on discharge instructions.  5) Chronic Care/Health Maintenance:  -she   on ACEI/ARB and Statin medications and  is encouraged to initiate and continue to follow up with Ophthalmology, Dentist,  Podiatrist at least yearly or according to recommendations, and advised to   stay away from smoking. I have recommended yearly flu vaccine and pneumonia vaccine at least every 5 years; moderate intensity exercise for up to 150 minutes weekly; and  sleep for at least 7 hours a day.  Her foot exam  was negative today.  She is encouraged to take  her brace/sling to her orthopedist for proper application.  - she is  advised to maintain close follow up with Fanta, Tesfaye Demissie, MD for primary care needs, as well as her other providers for optimal and coordinated care.  I spent  26  minutes in the care of the patient today including review of labs from CMP, Lipids, Thyroid  Function, Hematology (current and previous including abstractions from other facilities); face-to-face time discussing  her blood glucose readings/logs, discussing hypoglycemia and hyperglycemia episodes and symptoms, medications doses, her options of short and long term treatment based on the latest standards of care / guidelines;  discussion about incorporating lifestyle medicine;  and documenting the encounter. Risk reduction counseling performed per USPSTF guidelines to reduce  obesity and cardiovascular risk factors.     Please refer to Patient Instructions for Blood Glucose Monitoring and Insulin /Medications Dosing Guide  in media tab for additional information. Please  also refer to  Patient Self Inventory in the Media  tab for reviewed elements of pertinent patient history.  Mary Robinson participated in the discussions, expressed understanding, and voiced agreement with the above plans.  All questions were answered to her satisfaction. she is encouraged to contact clinic should she have any questions or concerns prior to her return visit.     Follow up plan: - Return in about 4 months (around 07/25/2024) for F/U with Pre-visit Labs, Meter/CGM/Logs, A1c here.  Ranny Earl, MD [facility] [address] [place], [place] [address] Phone: [phone]  Fax: [phone]    03/24/2024, 2:04 PM  This note was partially dictated with voice recognition software. Similar sounding words can be transcribed inadequately or may not  be corrected upon review.

## 2024-04-12 ENCOUNTER — Encounter (INDEPENDENT_AMBULATORY_CARE_PROVIDER_SITE_OTHER): Payer: Self-pay | Admitting: *Deleted

## 2024-04-12 DIAGNOSIS — E1165 Type 2 diabetes mellitus with hyperglycemia: Secondary | ICD-10-CM | POA: Diagnosis not present

## 2024-04-12 DIAGNOSIS — I10 Essential (primary) hypertension: Secondary | ICD-10-CM | POA: Diagnosis not present

## 2024-04-12 DIAGNOSIS — J4541 Moderate persistent asthma with (acute) exacerbation: Secondary | ICD-10-CM | POA: Diagnosis not present

## 2024-04-12 DIAGNOSIS — E785 Hyperlipidemia, unspecified: Secondary | ICD-10-CM | POA: Diagnosis not present

## 2024-05-13 DIAGNOSIS — E1165 Type 2 diabetes mellitus with hyperglycemia: Secondary | ICD-10-CM | POA: Diagnosis not present

## 2024-05-13 DIAGNOSIS — I10 Essential (primary) hypertension: Secondary | ICD-10-CM | POA: Diagnosis not present

## 2024-06-07 ENCOUNTER — Other Ambulatory Visit (HOSPITAL_COMMUNITY): Payer: Self-pay | Admitting: Gerontology

## 2024-06-07 ENCOUNTER — Ambulatory Visit (HOSPITAL_COMMUNITY)
Admission: RE | Admit: 2024-06-07 | Discharge: 2024-06-07 | Disposition: A | Discharge status: Home / Self Care | Source: Ambulatory Visit | Attending: Gerontology | Admitting: Gerontology

## 2024-06-07 DIAGNOSIS — M549 Dorsalgia, unspecified: Secondary | ICD-10-CM

## 2024-06-07 DIAGNOSIS — M8580 Other specified disorders of bone density and structure, unspecified site: Secondary | ICD-10-CM | POA: Diagnosis not present

## 2024-06-07 DIAGNOSIS — M47816 Spondylosis without myelopathy or radiculopathy, lumbar region: Secondary | ICD-10-CM | POA: Diagnosis not present

## 2024-06-07 DIAGNOSIS — M4317 Spondylolisthesis, lumbosacral region: Secondary | ICD-10-CM | POA: Diagnosis not present

## 2024-06-09 DIAGNOSIS — I10 Essential (primary) hypertension: Secondary | ICD-10-CM | POA: Diagnosis not present

## 2024-06-09 DIAGNOSIS — M5442 Lumbago with sciatica, left side: Secondary | ICD-10-CM | POA: Diagnosis not present

## 2024-06-09 DIAGNOSIS — Z23 Encounter for immunization: Secondary | ICD-10-CM | POA: Diagnosis not present

## 2024-07-26 LAB — ENHANCED LIVER FIBROSIS (ELF): ELF(TM) Score: 9.26 (ref <9.80)

## 2024-07-26 LAB — COMPREHENSIVE METABOLIC PANEL WITH GFR
ALT: 32 IU/L (ref 0–32)
AST: 34 IU/L (ref 0–40)
Albumin: 4.4 g/dL (ref 3.9–4.9)
Alkaline Phosphatase: 67 IU/L (ref 49–135)
BUN/Creatinine Ratio: 15 (ref 12–28)
BUN: 16 mg/dL (ref 8–27)
Bilirubin Total: 0.4 mg/dL (ref 0.0–1.2)
CO2: 27 mmol/L (ref 20–29)
Calcium: 9.7 mg/dL (ref 8.7–10.3)
Chloride: 100 mmol/L (ref 96–106)
Creatinine, Ser: 1.07 mg/dL — ABNORMAL HIGH (ref 0.57–1.00)
Globulin, Total: 3.1 g/dL (ref 1.5–4.5)
Glucose: 130 mg/dL — ABNORMAL HIGH (ref 70–99)
Potassium: 4.6 mmol/L (ref 3.5–5.2)
Sodium: 142 mmol/L (ref 134–144)
Total Protein: 7.5 g/dL (ref 6.0–8.5)
eGFR: 56 mL/min/1.73 — ABNORMAL LOW (ref >59)

## 2024-07-26 LAB — FIB-4 W/REFLEX TO ELF
FIB-4 Index: 1.39 (ref 0.00–2.67)
Platelets: 303 x10E3/uL (ref 150–450)

## 2024-07-26 LAB — LIPID PANEL
Chol/HDL Ratio: 2.9 ratio (ref 0.0–4.4)
Cholesterol, Total: 158 mg/dL (ref 100–199)
HDL: 55 mg/dL (ref >39)
LDL Chol Calc (NIH): 80 mg/dL (ref 0–99)
Triglycerides: 134 mg/dL (ref 0–149)
VLDL Cholesterol Cal: 23 mg/dL (ref 5–40)

## 2024-07-28 ENCOUNTER — Ambulatory Visit: Admitting: "Endocrinology

## 2024-07-28 ENCOUNTER — Encounter: Payer: Self-pay | Admitting: "Endocrinology

## 2024-07-28 VITALS — BP 122/78 | HR 76 | Ht 64.0 in | Wt 191.0 lb

## 2024-07-28 DIAGNOSIS — E782 Mixed hyperlipidemia: Secondary | ICD-10-CM

## 2024-07-28 DIAGNOSIS — Z7984 Long term (current) use of oral hypoglycemic drugs: Secondary | ICD-10-CM

## 2024-07-28 DIAGNOSIS — E1165 Type 2 diabetes mellitus with hyperglycemia: Secondary | ICD-10-CM

## 2024-07-28 DIAGNOSIS — E66811 Obesity, class 1: Secondary | ICD-10-CM

## 2024-07-28 DIAGNOSIS — I10 Essential (primary) hypertension: Secondary | ICD-10-CM

## 2024-07-28 DIAGNOSIS — Z6834 Body mass index (BMI) 34.0-34.9, adult: Secondary | ICD-10-CM

## 2024-07-28 DIAGNOSIS — E6609 Other obesity due to excess calories: Secondary | ICD-10-CM

## 2024-07-28 LAB — POCT GLYCOSYLATED HEMOGLOBIN (HGB A1C): HbA1c, POC (controlled diabetic range): 7.3 % — AB (ref 0.0–7.0)

## 2024-07-28 NOTE — Patient Instructions (Signed)

## 2024-07-28 NOTE — Progress Notes (Signed)
 07/28/2024, 12:55 PM   Endocrinology follow-up note  Subjective:    Patient ID: Mary Robinson, female    DOB: 08-Oct-1955.  KEELA RUBERT is being seen in follow-up after she was seen in consultation for management of currently uncontrolled symptomatic diabetes requested by  Carlette Benita Area, MD.   Past Medical History:  Diagnosis Date   Anemia    Anxiety    Arthritis    Asthma    Bursitis    Chronic back pain    Chronic constipation    Depression    Diastasis recti    Essential hypertension    GERD (gastroesophageal reflux disease)    Helicobacter pylori gastritis    Treated in May 2011   Hyperlipidemia    Obesity    S/P colonoscopy 12/2009   Multiple tubular adenomas: next TCS May 2014   S/P endoscopy 12/2009   Severe erosive reflux esophagitis, noncritial schatzki's ring, +h.pylori   Seasonal allergies    Sleep apnea    Type II diabetes mellitus (HCC)    Umbilical hernia     Past Surgical History:  Procedure Laterality Date   CHOLECYSTECTOMY  1999   COLONOSCOPY  May 2011   Dr. Shaaron: long, torturous colon, multiple colonic polyps. TUBULAR ADENOMAS   COLONOSCOPY N/A 04/15/2013   normal rectum, torturous colon.  tubular adenomas. Surveillance due 2019.    DILATION AND CURETTAGE OF UTERUS  1970's   ESOPHAGOGASTRODUODENOSCOPY  May 2011   Dr. Shaaron: severe erosive reflux esophagitis, non-critical Schatzki's ring, diffuse gastric erosions. H. PYLORI GASTRITIS   HERNIA REPAIR  05/04/2013   laparoscopic incisional hernia repair w/mesh per notes(05/04/2013)   INCISIONAL HERNIA REPAIR N/A 05/04/2013   Procedure: LAPAROSCOPIC INCISIONAL HERNIA ;  Surgeon: Elon CHRISTELLA Pacini, MD;  Location: [facility] OR;  Service: General;  Laterality: N/A;   INSERTION OF MESH N/A 05/04/2013   Procedure: INSERTION OF MESH;  Surgeon: Elon CHRISTELLA Pacini, MD;  Location: [facility] OR;  Service: General;  Laterality: N/A;    TUBAL LIGATION  1970's    Social History   Socioeconomic History   Marital status: Single    Spouse name: Not on file   Number of children: Not on file   Years of education: Not on file   Highest education level: Not on file  Occupational History   Occupation: retired  Tobacco Use   Smoking status: Never   Smokeless tobacco: Never  Vaping Use   Vaping status: Never Used  Substance and Sexual Activity   Alcohol use: No   Drug use: No   Sexual activity: Never    Birth control/protection: Post-menopausal  Other Topics Concern   Not on file  Social History Narrative   Not on file   Social Drivers of Health   Financial Resource Strain: Low Risk  (09/10/2023)   Overall Financial Resource Strain (CARDIA)    Difficulty of Paying Living Expenses: Not very hard  Food Insecurity: No Food Insecurity (09/10/2023)   Hunger Vital Sign    Worried About Running Out of Food in the Last Year: Never true    Ran Out of Food in the  Last Year: Never true  Transportation Needs: No Transportation Needs (09/12/2023)   PRAPARE - Administrator, Civil Service (Medical): No    Lack of Transportation (Non-Medical): No  Physical Activity: Not on file  Stress: Not on file  Social Connections: Unknown (01/15/2022)   [facility]   Social Network    Social Network: Not on file    Family History  Problem Relation Age of Onset   Colon cancer Maternal Uncle    Colon cancer Paternal Uncle    COPD Mother    Hypertension Mother    Seizures Mother    Chronic Renal Failure Mother    Hyperlipidemia Mother    Stroke Mother    Kidney disease Mother    Heart failure Mother    Heart disease Father    Diabetes Sister    Hypertension Sister    Heart disease Sister    Diabetes Brother    Hypertension Brother    Hypertension Daughter    Hypertension Son    Diabetes Sister    Hypertension Sister    Diabetes Sister    Hypertension Sister     Outpatient Encounter Medications  as of 07/28/2024  Medication Sig   ACCU-CHEK GUIDE TEST test strip TEST GLUCOSE 2 TIMES DAILY.   acetaminophen  (TYLENOL ) 500 MG tablet Take 500-1,000 mg by mouth every 6 (six) hours as needed for moderate pain.    albuterol  (VENTOLIN  HFA) 108 (90 Base) MCG/ACT inhaler Inhale 2 puffs into the lungs every 6 (six) hours as needed for wheezing or shortness of breath.   amLODipine  (NORVASC ) 10 MG tablet Take 10 mg by mouth daily with breakfast.    aspirin  81 MG tablet Take 81 mg by mouth daily.     Blood Glucose Monitoring Suppl (ACCU-CHEK GUIDE ME) w/Device KIT 1 Piece by Does not apply route as directed.   doxycycline  (VIBRAMYCIN ) 100 MG capsule Take 1 capsule (100 mg total) by mouth 2 (two) times daily.   hydrochlorothiazide (HYDRODIURIL) 25 MG tablet Take 25 mg by mouth daily.   loratadine  (CLARITIN ) 10 MG tablet Take 10 mg by mouth daily.     magnesium  oxide (MAG-OX) 400 MG tablet Take 400 mg by mouth daily.    metFORMIN  (GLUCOPHAGE ) 500 MG tablet TAKE 2 TABLETS (1,000 MG TOTAL) BY MOUTH 2 (TWO) TIMES DAILY WITH A MEAL.   methocarbamol (ROBAXIN) 500 MG tablet Take 1,000 mg by mouth 3 (three) times daily as needed.   omeprazole (PRILOSEC) 20 MG capsule Take 20 mg by mouth daily with breakfast.    polyethylene glycol (MIRALAX  / GLYCOLAX ) packet Take 17 g by mouth daily as needed (for constipation).   predniSONE  (DELTASONE ) 50 MG tablet One tablet a day for 5 days   simvastatin  (ZOCOR ) 20 MG tablet Take 20 mg by mouth daily.   spironolactone (ALDACTONE) 25 MG tablet Take 50 mg by mouth once. (Patient not taking: Reported on 11/11/2022)   Vitamin D, Ergocalciferol, (DRISDOL) 1.25 MG (50000 UNIT) CAPS capsule Take 50,000 Units by mouth every 30 (thirty) days.   No facility-administered encounter medications on file as of 07/28/2024.    ALLERGIES: No Known Allergies  VACCINATION STATUS: Immunization History  Administered Date(s) Administered   Influenza-Unspecified 06/29/2014, 07/03/2018     Diabetes She presents for her follow-up diabetic visit. She has type 2 diabetes mellitus. Her disease course has been improving. There are no hypoglycemic associated symptoms. Pertinent negatives for hypoglycemia include no confusion, headaches, pallor or seizures. Pertinent negatives  for diabetes include no chest pain, no fatigue, no polydipsia, no polyphagia and no polyuria. There are no hypoglycemic complications. Symptoms are improving. Risk factors for coronary artery disease include dyslipidemia, diabetes mellitus, family history, obesity, hypertension, sedentary lifestyle and post-menopausal. Current diabetic treatments: She is currently on Metformin  1000 mg p.o. twice daily, Tradjenta  5 mg p.o. daily. Her weight is fluctuating minimally. She is following a generally unhealthy diet. When asked about meal planning, she reported none. She has not had a previous visit with a dietitian. She rarely participates in exercise. Her home blood glucose trend is decreasing steadily. Her breakfast blood glucose range is generally 140-180 mg/dl. Her bedtime blood glucose range is generally 140-180 mg/dl. Her overall blood glucose range is 140-180 mg/dl. Jeanmarie presents with her meter showing significant improvement in her average blood glucose at 142 fasting for the last 30 days.  Her point-of-care A1c is 7.3% improving from 7.9%.  She did not document any hypoglycemia.     ) An ACE inhibitor/angiotensin II receptor blocker is being taken. She does not see a podiatrist.Eye exam is current.  Hyperlipidemia This is a chronic problem. The current episode started more than 1 year ago. The problem is uncontrolled. Exacerbating diseases include diabetes and obesity. Pertinent negatives include no chest pain, myalgias or shortness of breath. Current antihyperlipidemic treatment includes statins. Risk factors for coronary artery disease include diabetes mellitus, dyslipidemia, hypertension, a sedentary lifestyle,  post-menopausal, obesity and family history.  Hypertension This is a chronic problem. The current episode started more than 1 year ago. The problem is controlled. Pertinent negatives include no chest pain, headaches, palpitations or shortness of breath. Risk factors for coronary artery disease include dyslipidemia, diabetes mellitus, obesity, sedentary lifestyle, family history and post-menopausal state. Past treatments include angiotensin blockers and calcium channel blockers.     Objective:       07/28/2024   11:05 AM 03/24/2024    1:18 PM 09/19/2023   10:06 AM  Vitals with BMI  Height 5' 4 5' 4 5' 4  Weight 191 lbs 196 lbs 195 lbs 13 oz  BMI 32.77 33.63 33.59  Systolic 122 134 881  Diastolic 78 74 66  Pulse 76 80 76    BP 122/78   Pulse 76   Ht 5' 4 (1.626 m)   Wt 191 lb (86.6 kg)   BMI 32.79 kg/m   Wt Readings from Last 3 Encounters:  07/28/24 191 lb (86.6 kg)  03/24/24 196 lb (88.9 kg)  09/19/23 195 lb 12.8 oz (88.8 kg)     CMP ( most recent) CMP     Component Value Date/Time   NA 142 07/21/2024 1035   K 4.6 07/21/2024 1035   CL 100 07/21/2024 1035   CO2 27 07/21/2024 1035   GLUCOSE 130 (H) 07/21/2024 1035   GLUCOSE 125 (H) 01/26/2023 1253   BUN 16 07/21/2024 1035   CREATININE 1.07 (H) 07/21/2024 1035   CREATININE 1.05 (H) 11/09/2019 1020   CALCIUM 9.7 07/21/2024 1035   PROT 7.5 07/21/2024 1035   ALBUMIN 4.4 07/21/2024 1035   AST 34 07/21/2024 1035   ALT 32 07/21/2024 1035   ALKPHOS 67 07/21/2024 1035   BILITOT 0.4 07/21/2024 1035   GFRNONAA >60 01/26/2023 1253   GFRNONAA 55 (L) 11/09/2019 1020   GFRAA 69 10/10/2020 0000   GFRAA 64 11/09/2019 1020     Diabetic Labs (most recent): Lab Results  Component Value Date   HGBA1C 7.3 (A) 07/28/2024   HGBA1C 7.9 (A)  03/24/2024   HGBA1C 7.7 (A) 03/18/2023     Lipid Panel ( most recent) Lipid Panel     Component Value Date/Time   CHOL 158 07/21/2024 1035   TRIG 134 07/21/2024 1035   HDL 55  07/21/2024 1035   CHOLHDL 2.9 07/21/2024 1035   LDLCALC 80 07/21/2024 1035   LABVLDL 23 07/21/2024 1035      Assessment & Plan:   1. 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  Addysin presents with her meter showing significant improvement in her average blood glucose at 142 fasting for the last 30 days.  Her point-of-care A1c is 7.3% improving from 7.9%.  She did not document any hypoglycemia.    -Recent labs reviewed. - I had a long discussion with her about the progressive nature of diabetes and the pathology behind its complications. -her diabetes is complicated by obesity/sedentary life and she remains at a high risk for more acute and chronic complications which include CAD, CVA, CKD, retinopathy, and neuropathy. These are all discussed in detail with her.  - I have counseled her on diet  and weight management  by adopting a carbohydrate restricted/protein rich diet. Patient is encouraged to switch to  unprocessed or minimally processed     complex starch and increased protein intake (animal or plant source), fruits, and vegetables. -  she is advised to stick to a routine mealtimes to eat 3 meals  a day and avoid unnecessary snacks ( to snack only to correct hypoglycemia).    - she acknowledges that there is a room for improvement in her food and drink choices. - Suggestion is made for her to avoid simple carbohydrates  from her diet including Cakes, Sweet Desserts, Ice Cream, Soda (diet and regular), Sweet Tea, Candies, Chips, Cookies, Store Bought Juices, Alcohol , Artificial Sweeteners,  Coffee Creamer, and Sugar-free Products, Lemonade. This will help patient to have more stable blood glucose profile and potentially avoid unintended weight gain.   - she has been scheduled with Penny Crumpton, RDN, CDE for diabetes education.  - I have approached her with the following  individualized plan to manage  her diabetes and patient agrees:  She did not tolerate Rybelsus  in the past.  She did not tolerate SGLT2 inhibitors including Farxiga  even at a lower dose of 5 mg.    She was approached for weekly GLP-1 receptor agonist, however patient declines injectable treatment at this time.  She presents with reasonable control of glycemia with her metformin .  She is advised to continue  metformin  1000 mg p.o. twice daily, willing to continue monitoring blood glucose twice a day-daily before breakfast and at bedtime.   - she is encouraged to call clinic for blood glucose levels less than 70 or above 150  mg per DL at fasting .   - Specific targets for  A1c;  LDL, HDL,  and Triglycerides were discussed with the patient.  2) Blood Pressure /Hypertension: Her blood pressure is controlled to target.  she is advised to continue her current medications including amlodipine  10 mg p.o. daily, losartan  100 mg p.o. daily with breakfast.     3) Lipids/Hyperlipidemia:   Review of her recent lipid panel shows improving LDL at 80 from 105.  She is advised to continue simvastatin  20 mg daily at bedtime.      Side effects and precautions discussed with her.    4)  Weight/Diet:  Body mass index is 32.79 kg/m.  -She presents with progressive weight gain.     she is  a candidate for more weight loss. I discussed with her the fact that loss of 5 - 10% of her  current body weight will have the most impact on her diabetes management.  Exercise, and detailed carbohydrates information provided  -  detailed on discharge instructions.  5) Chronic Care/Health Maintenance:  -she   on ACEI/ARB and Statin medications and  is encouraged to initiate and continue to follow up with Ophthalmology, Dentist,  Podiatrist at least yearly or according to recommendations, and advised to   stay away from smoking. I have recommended yearly flu vaccine and pneumonia vaccine at least every 5 years; moderate intensity  exercise for up to 150 minutes weekly; and  sleep for at least 7 hours a day.  Her foot exam was negative today.  She is encouraged to take  her brace/sling to her orthopedist for proper application.  - she is  advised to maintain close follow up with Fanta, Tesfaye Demissie, MD for primary care needs, as well as her other providers for optimal and coordinated care.   I spent  25  minutes in the care of the patient today including review of labs from CMP, Lipids, Thyroid  Function, Hematology (current and previous including abstractions from other facilities); face-to-face time discussing  her blood glucose readings/logs, discussing hypoglycemia and hyperglycemia episodes and symptoms, medications doses, her options of short and long term treatment based on the latest standards of care / guidelines;  discussion about incorporating lifestyle medicine;  and documenting the encounter. Risk reduction counseling performed per USPSTF guidelines to reduce  obesity and cardiovascular risk factors.     Please refer to Patient Instructions for Blood Glucose Monitoring and Insulin /Medications Dosing Guide  in media tab for additional information. Please  also refer to  Patient Self Inventory in the Media  tab for reviewed elements of pertinent patient history.  Lizzy D Mossbarger participated in the discussions, expressed understanding, and voiced agreement with the above plans.  All questions were answered to her satisfaction. she is encouraged to contact clinic should she have any questions or concerns prior to her return visit.    Follow up plan: - Return in about 4 months (around 11/25/2024) for Bring Meter/CGM Device/Logs- A1c in Office.  Ranny Earl, MD [facility] [address] [place], [place] [address] Phone: [phone]  Fax: [phone]    07/28/2024, 12:55 PM  This note was partially dictated with voice recognition software. Similar  sounding words can be transcribed inadequately or may not  be corrected upon review.

## 2024-11-25 ENCOUNTER — Ambulatory Visit: Admitting: "Endocrinology
